# Patient Record
Sex: Male | Born: 1935 | Race: White | Hispanic: No | Marital: Married | Smoking: Current every day smoker
Health system: Southern US, Community
[De-identification: ages and names within clinical notes are randomized; demographics above are authoritative.]

## PROBLEM LIST (undated history)

## (undated) DIAGNOSIS — J189 Pneumonia, unspecified organism: Secondary | ICD-10-CM

## (undated) DIAGNOSIS — C61 Malignant neoplasm of prostate: Secondary | ICD-10-CM

## (undated) DIAGNOSIS — R0602 Shortness of breath: Secondary | ICD-10-CM

## (undated) DIAGNOSIS — I82622 Acute embolism and thrombosis of deep veins of left upper extremity: Secondary | ICD-10-CM

## (undated) DIAGNOSIS — L0390 Cellulitis, unspecified: Secondary | ICD-10-CM

## (undated) DIAGNOSIS — I480 Paroxysmal atrial fibrillation: Secondary | ICD-10-CM

## (undated) DIAGNOSIS — J449 Chronic obstructive pulmonary disease, unspecified: Secondary | ICD-10-CM

## (undated) DIAGNOSIS — F329 Major depressive disorder, single episode, unspecified: Secondary | ICD-10-CM

## (undated) DIAGNOSIS — Z9289 Personal history of other medical treatment: Secondary | ICD-10-CM

## (undated) DIAGNOSIS — M1990 Unspecified osteoarthritis, unspecified site: Secondary | ICD-10-CM

## (undated) DIAGNOSIS — I499 Cardiac arrhythmia, unspecified: Secondary | ICD-10-CM

## (undated) DIAGNOSIS — I5021 Acute systolic (congestive) heart failure: Secondary | ICD-10-CM

## (undated) DIAGNOSIS — Z950 Presence of cardiac pacemaker: Secondary | ICD-10-CM

## (undated) DIAGNOSIS — K219 Gastro-esophageal reflux disease without esophagitis: Secondary | ICD-10-CM

## (undated) DIAGNOSIS — I10 Essential (primary) hypertension: Secondary | ICD-10-CM

## (undated) HISTORY — DX: Unspecified osteoarthritis, unspecified site: M19.90

## (undated) HISTORY — DX: Chronic obstructive pulmonary disease, unspecified: J44.9

## (undated) HISTORY — DX: Presence of cardiac pacemaker: Z95.0

## (undated) HISTORY — DX: Shortness of breath: R06.02

## (undated) HISTORY — DX: Cellulitis, unspecified: L03.90

## (undated) HISTORY — DX: Personal history of other medical treatment: Z92.89

## (undated) HISTORY — DX: Paroxysmal atrial fibrillation: I48.0

## (undated) HISTORY — PX: PILONIDAL CYST DRAINAGE: SHX743

## (undated) HISTORY — PX: HERNIA REPAIR: SHX51

## (undated) HISTORY — DX: Major depressive disorder, single episode, unspecified: F32.9

## (undated) HISTORY — DX: Acute embolism and thrombosis of deep veins of left upper extremity: I82.622

## (undated) HISTORY — DX: Malignant neoplasm of prostate: C61

---

## 2003-04-25 ENCOUNTER — Ambulatory Visit (HOSPITAL_COMMUNITY): Admission: RE | Admit: 2003-04-25 | Discharge: 2003-04-25 | Payer: Self-pay | Admitting: Family Medicine

## 2005-12-12 ENCOUNTER — Encounter: Admission: RE | Admit: 2005-12-12 | Discharge: 2005-12-12 | Payer: Self-pay | Admitting: Family Medicine

## 2009-05-25 ENCOUNTER — Ambulatory Visit: Admission: RE | Admit: 2009-05-25 | Discharge: 2009-08-23 | Payer: Self-pay | Admitting: Radiation Oncology

## 2009-08-24 ENCOUNTER — Ambulatory Visit: Admission: RE | Admit: 2009-08-24 | Discharge: 2009-10-03 | Payer: Self-pay | Admitting: Radiation Oncology

## 2010-07-27 ENCOUNTER — Ambulatory Visit (INDEPENDENT_AMBULATORY_CARE_PROVIDER_SITE_OTHER): Payer: Medicare Other | Admitting: Urology

## 2010-07-27 DIAGNOSIS — C61 Malignant neoplasm of prostate: Secondary | ICD-10-CM

## 2010-07-27 DIAGNOSIS — N529 Male erectile dysfunction, unspecified: Secondary | ICD-10-CM

## 2010-10-05 DIAGNOSIS — Z9289 Personal history of other medical treatment: Secondary | ICD-10-CM | POA: Insufficient documentation

## 2010-10-05 HISTORY — PX: US ECHOCARDIOGRAPHY: HXRAD669

## 2010-10-05 HISTORY — PX: NM MYOCAR PERF WALL MOTION: HXRAD629

## 2010-10-05 HISTORY — PX: TRANSTHORACIC ECHOCARDIOGRAM: SHX275

## 2010-10-05 HISTORY — DX: Personal history of other medical treatment: Z92.89

## 2010-11-30 ENCOUNTER — Ambulatory Visit (INDEPENDENT_AMBULATORY_CARE_PROVIDER_SITE_OTHER): Payer: Medicare Other | Admitting: Urology

## 2010-11-30 DIAGNOSIS — C61 Malignant neoplasm of prostate: Secondary | ICD-10-CM

## 2010-11-30 DIAGNOSIS — N529 Male erectile dysfunction, unspecified: Secondary | ICD-10-CM

## 2011-05-31 ENCOUNTER — Ambulatory Visit (INDEPENDENT_AMBULATORY_CARE_PROVIDER_SITE_OTHER): Payer: Medicare Other | Admitting: Urology

## 2011-05-31 DIAGNOSIS — C61 Malignant neoplasm of prostate: Secondary | ICD-10-CM

## 2011-07-18 ENCOUNTER — Ambulatory Visit (HOSPITAL_COMMUNITY)
Admission: RE | Admit: 2011-07-18 | Discharge: 2011-07-18 | Disposition: A | Discharge status: Home / Self Care | Payer: Medicare Other | Source: Ambulatory Visit | Attending: Internal Medicine | Admitting: Internal Medicine

## 2011-07-18 ENCOUNTER — Other Ambulatory Visit (HOSPITAL_COMMUNITY): Payer: Self-pay | Admitting: Internal Medicine

## 2011-07-18 DIAGNOSIS — R55 Syncope and collapse: Secondary | ICD-10-CM

## 2011-07-18 DIAGNOSIS — W19XXXA Unspecified fall, initial encounter: Secondary | ICD-10-CM

## 2011-07-18 DIAGNOSIS — S0990XA Unspecified injury of head, initial encounter: Secondary | ICD-10-CM | POA: Insufficient documentation

## 2011-07-18 DIAGNOSIS — W1809XA Striking against other object with subsequent fall, initial encounter: Secondary | ICD-10-CM | POA: Insufficient documentation

## 2011-07-18 DIAGNOSIS — R51 Headache: Secondary | ICD-10-CM | POA: Insufficient documentation

## 2011-07-19 ENCOUNTER — Other Ambulatory Visit (HOSPITAL_COMMUNITY): Payer: Self-pay | Admitting: Cardiovascular Disease

## 2011-07-19 ENCOUNTER — Encounter (HOSPITAL_COMMUNITY): Payer: Self-pay | Admitting: Pharmacy Technician

## 2011-07-19 ENCOUNTER — Ambulatory Visit (HOSPITAL_COMMUNITY)
Admission: RE | Admit: 2011-07-19 | Discharge: 2011-07-19 | Disposition: A | Discharge status: Home / Self Care | Payer: Medicare Other | Source: Ambulatory Visit | Attending: Cardiovascular Disease | Admitting: Cardiovascular Disease

## 2011-07-19 DIAGNOSIS — Z01811 Encounter for preprocedural respiratory examination: Secondary | ICD-10-CM

## 2011-07-19 DIAGNOSIS — Z01818 Encounter for other preprocedural examination: Secondary | ICD-10-CM | POA: Insufficient documentation

## 2011-07-20 ENCOUNTER — Other Ambulatory Visit: Payer: Self-pay | Admitting: Cardiovascular Disease

## 2011-07-21 MED ORDER — SODIUM CHLORIDE 0.9 % IJ SOLN: 3.0000 mL | Freq: Two times a day (BID) | INTRAMUSCULAR | Status: DC

## 2011-07-21 MED ORDER — SODIUM CHLORIDE 0.9 % IR SOLN
80.0000 mg | Status: DC
  Filled 2011-07-21: qty 2

## 2011-07-21 MED ORDER — CEFAZOLIN SODIUM-DEXTROSE 2-3 GM-% IV SOLR
2.0000 g | INTRAVENOUS | Status: DC
  Filled 2011-07-21: qty 50

## 2011-07-21 MED ORDER — SODIUM CHLORIDE 0.9 % IV SOLN
250.0000 mL | INTRAVENOUS | Status: DC
  Administered 2011-07-22: 1000 mL via INTRAVENOUS

## 2011-07-21 MED ORDER — SODIUM CHLORIDE 0.9 % IJ SOLN: 3.0000 mL | INTRAMUSCULAR | Status: DC | PRN

## 2011-07-21 MED ORDER — SODIUM CHLORIDE 0.45 % IV SOLN
INTRAVENOUS | Status: DC
  Administered 2011-07-22: 09:00:00 via INTRAVENOUS

## 2011-07-22 ENCOUNTER — Encounter (HOSPITAL_COMMUNITY)
Admission: RE | Disposition: A | Discharge status: Home / Self Care | Payer: Self-pay | Source: Ambulatory Visit | Attending: Cardiovascular Disease

## 2011-07-22 ENCOUNTER — Ambulatory Visit (HOSPITAL_COMMUNITY): Payer: Medicare Other

## 2011-07-22 ENCOUNTER — Ambulatory Visit (HOSPITAL_COMMUNITY)
Admission: RE | Admit: 2011-07-22 | Discharge: 2011-07-23 | Disposition: A | Discharge status: Home / Self Care | Payer: Medicare Other | Source: Ambulatory Visit | Attending: Cardiovascular Disease | Admitting: Cardiovascular Disease

## 2011-07-22 DIAGNOSIS — R55 Syncope and collapse: Secondary | ICD-10-CM | POA: Insufficient documentation

## 2011-07-22 DIAGNOSIS — I4891 Unspecified atrial fibrillation: Secondary | ICD-10-CM | POA: Insufficient documentation

## 2011-07-22 DIAGNOSIS — Z950 Presence of cardiac pacemaker: Secondary | ICD-10-CM

## 2011-07-22 DIAGNOSIS — I495 Sick sinus syndrome: Secondary | ICD-10-CM | POA: Insufficient documentation

## 2011-07-22 HISTORY — PX: PACEMAKER INSERTION: SHX728

## 2011-07-22 HISTORY — DX: Cardiac arrhythmia, unspecified: I49.9

## 2011-07-22 HISTORY — DX: Unspecified osteoarthritis, unspecified site: M19.90

## 2011-07-22 HISTORY — DX: Presence of cardiac pacemaker: Z95.0

## 2011-07-22 HISTORY — DX: Gastro-esophageal reflux disease without esophagitis: K21.9

## 2011-07-22 HISTORY — PX: PERMANENT PACEMAKER INSERTION: SHX5480

## 2011-07-22 HISTORY — DX: Essential (primary) hypertension: I10

## 2011-07-22 LAB — SURGICAL PCR SCREEN
MRSA, PCR: NEGATIVE
Staphylococcus aureus: NEGATIVE

## 2011-07-22 SURGERY — PERMANENT PACEMAKER INSERTION
Anesthesia: LOCAL

## 2011-07-22 MED ORDER — MUPIROCIN 2 % EX OINT
TOPICAL_OINTMENT | CUTANEOUS | Status: AC
  Administered 2011-07-22: 1
  Filled 2011-07-22: qty 22

## 2011-07-22 MED ORDER — CEFAZOLIN SODIUM 1-5 GM-% IV SOLN
1.0000 g | Freq: Four times a day (QID) | INTRAVENOUS | Status: AC
  Administered 2011-07-22 – 2011-07-23 (×3): 1 g via INTRAVENOUS
  Filled 2011-07-22 (×3): qty 50

## 2011-07-22 MED ORDER — WARFARIN SODIUM 7.5 MG PO TABS: 15.0000 mg | ORAL_TABLET | ORAL | Status: DC

## 2011-07-22 MED ORDER — WARFARIN SODIUM 2.5 MG PO TABS
2.5000 mg | ORAL_TABLET | Freq: Every day | ORAL | Status: DC
Start: 2011-07-22 — End: 2011-07-22

## 2011-07-22 MED ORDER — MIDAZOLAM HCL 2 MG/2ML IJ SOLN
INTRAMUSCULAR | Status: AC
  Filled 2011-07-22: qty 2

## 2011-07-22 MED ORDER — WARFARIN SODIUM 10 MG PO TABS: 10.0000 mg | ORAL_TABLET | ORAL | Status: DC

## 2011-07-22 MED ORDER — ASPIRIN EC 81 MG PO TBEC
81.0000 mg | DELAYED_RELEASE_TABLET | Freq: Every day | ORAL | Status: DC
  Administered 2011-07-23: 81 mg via ORAL
  Filled 2011-07-22: qty 1

## 2011-07-22 MED ORDER — WARFARIN SODIUM 2.5 MG PO TABS
12.5000 mg | ORAL_TABLET | ORAL | Status: AC
  Administered 2011-07-22: 12.5 mg via ORAL
  Filled 2011-07-22: qty 1

## 2011-07-22 MED ORDER — FENTANYL CITRATE 0.05 MG/ML IJ SOLN
INTRAMUSCULAR | Status: AC
  Filled 2011-07-22: qty 2

## 2011-07-22 MED ORDER — ONDANSETRON HCL 4 MG/2ML IJ SOLN: 4.0000 mg | Freq: Four times a day (QID) | INTRAMUSCULAR | Status: DC | PRN

## 2011-07-22 MED ORDER — CEFAZOLIN SODIUM-DEXTROSE 2-3 GM-% IV SOLR
INTRAVENOUS | Status: AC
  Filled 2011-07-22: qty 50

## 2011-07-22 MED ORDER — ZOLPIDEM TARTRATE 5 MG PO TABS
5.0000 mg | ORAL_TABLET | Freq: Every evening | ORAL | Status: DC | PRN
  Administered 2011-07-23: 5 mg via ORAL
  Filled 2011-07-22: qty 1

## 2011-07-22 MED ORDER — METOPROLOL SUCCINATE 12.5 MG HALF TABLET
12.5000 mg | ORAL_TABLET | Freq: Every day | ORAL | Status: DC
  Administered 2011-07-22 – 2011-07-23 (×2): 12.5 mg via ORAL
  Filled 2011-07-22 (×2): qty 1

## 2011-07-22 MED ORDER — ACETAMINOPHEN 325 MG PO TABS
325.0000 mg | ORAL_TABLET | ORAL | Status: DC | PRN
  Administered 2011-07-22: 650 mg via ORAL
  Filled 2011-07-22: qty 2

## 2011-07-22 MED ORDER — LIDOCAINE HCL (PF) 1 % IJ SOLN
INTRAMUSCULAR | Status: AC
  Filled 2011-07-22: qty 90

## 2011-07-22 MED ORDER — WARFARIN - PHYSICIAN DOSING INPATIENT: Freq: Every day | Status: DC

## 2011-07-22 MED ORDER — PANTOPRAZOLE SODIUM 40 MG PO TBEC: 40.0000 mg | DELAYED_RELEASE_TABLET | Freq: Every day | ORAL | Status: DC

## 2011-07-22 MED ORDER — LISINOPRIL 5 MG PO TABS
5.0000 mg | ORAL_TABLET | Freq: Every day | ORAL | Status: DC
  Administered 2011-07-22 – 2011-07-23 (×2): 5 mg via ORAL
  Filled 2011-07-22 (×2): qty 1

## 2011-07-22 NOTE — H&P (Signed)
Tracie, Lindbloom Male, 78 y.o., [birth year]   MRN: [id number]  CSN: [id number]   Date of Initial H&P: 07/18/11  History reviewed, patient examined, no change in status, stable for surgery. &78 year old with syncope and event monitor documentation of sinus node dysfunction, and tachycardia-bradycardia syndrome (PAFib).  Dual chamber pacemaker implantation has been fully reviewed with the patient and written informed consent has been obtained. Thurmon Fair, MD, [facility] [phone] office [phone] pager 07/22/2011 8:54 AM

## 2011-07-22 NOTE — CV Procedure (Signed)
Hallis, Meditz Male, 78 y.o., May 08, 1930  MRN: [id number]  CSN: [id number]  Procedure report  Procedure performed: 1. Implantation of new dual chamber permanent pacemaker 2. Fluoroscopy 3. Light sedation    Reason for procedure: Symptomatic bradycardia and syncope due to: Sinus node dysfunction Tachycardia-bradycardia syndrome Bradycardia due to necessary medications Paroxysmal atrial fibrillation  Procedure performed by: Thurmon Fair, MD  Complications: None  Estimated blood loss: <10 mL  Medications administered during procedure: Ancef 1 g intravenously Lidocaine 1% 30 mL locally,  Fentanyl 100 mcg intravenously Versed 4 mg intravenously  Device details: Orthoptist DR RF model [id number] serial number [id number] Right atrial lead [employer] Tendril STS [phone] serial number [id number] Right ventricular lead [employer] Tendril STS [phone] serial number [id number]  Procedure details:  After the risks and benefits of the procedure were discussed the patient provided informed consent and was brought to the cardiac cath lab in the fasting state. The patient was prepped and draped in usual sterile fashion. Local anesthesia with 1% lidocaine was administered to to the left infraclavicular area. A 5-6 cm horizontal incision was made parallel with and 2-3 cm caudal to the left clavicle. Using electrocautery and blunt dissection a prepectoral pocket was created down to the level of the pectoralis major muscle fascia. The pocket was carefully inspected for hemostasis. An antibiotic-soaked sponge was placed in the pocket.  Under fluoroscopic guidance and using the modified Seldinger technique 2 separate venipunctures were performed to access the left subclavian vein. No difficulty was encountered accessing the vein.  Two J-tip guidewires were subsequently exchanged for two 7 French safe sheaths.  Under fluoroscopic guidance the ventricular lead was  advanced to level of the mid to apical right ventricular septum and thet active-fixation helix was deployed. Prominent current of injury was seen. Satisfactory pacing and sensing parameters were recorded. There was no evidence of diaphragmatic stimulation at maximum device output. The safe sheath was peeled away and the lead was secured in place with 2-0 silk.  In similar fashion the right atrial lead was advanced to the level of the atrial appendage. The active-fixation helix was deployed. There was prominent current of injury. Satisfactory  pacing and sensing parameters were recorded. There was no evidence of diaphragmatic stimulation with pacing at maximum device output. The safe sheath was peeled away and the lead was secured in place with 2-0 silk.  The antibiotic-soaked sponge was removed from the pocket. The pocket was flushed with copious amounts of antibiotic solution. Reinspection showed excellent hemostasis..  The ventricular lead was connected to the generator and appropriate ventricular pacing was seen. Subsequently the atrial lead was also connected. Repeat testing of the lead parameters later showed excellent values.  The entire system was then carefully inserted in the pocket with care been taking that the leads and device assumed a comfortable position without pressure on the incision. Great care was taken that the leads be located deep to the generator. The pocket was then closed in layers using 2 layers of 2-0 Vicryl and cutaneous staples, after which a sterile dressing was applied.  At the end of the procedure the following lead parameters were encountered:  Right atrial lead  sensed P waves 2.7, impedance 423 ohms, threshold 1.7 V at 0.4 ms pulse width. Current 4 mA  Right ventricular lead sensed R waves 14.6 mV, impedance 529 ohms, threshold 1.0 V at 0.4 ms pulse width. Current 2.5 mA   Thurmon Fair, MD, [facility] [phone]  office [phone] pager

## 2011-07-23 ENCOUNTER — Encounter (HOSPITAL_COMMUNITY): Payer: Self-pay | Admitting: General Practice

## 2011-07-23 ENCOUNTER — Ambulatory Visit (HOSPITAL_COMMUNITY): Payer: Medicare Other

## 2011-07-23 NOTE — Progress Notes (Signed)
Orthopedic Tech Progress Note Patient Details:  Cody George 01-02-1931 [id number]  Ortho Devices Type of Ortho Device: Arm foam sling Ortho Device/Splint Interventions: Application   Cammer, Mickie Bail 07/23/2011, 1:49 AM

## 2011-07-23 NOTE — Discharge Instructions (Signed)

## 2011-07-23 NOTE — Progress Notes (Signed)
[facility]  Subjective: Feels good.  No SOB  Objective: Vital signs in last 24 hours: Temp:  [97.1 F (36.2 C)-97.7 F (36.5 C)] 97.1 F (36.2 C) (06/08 0600) Pulse Rate:  [64-107] 64  (06/08 0600) Resp:  [14-16] 15  (06/08 0600) BP: (96-144)/(63-97) 126/77 mmHg (06/08 0600) SpO2:  [93 %-97 %] 95 % (06/08 0600) Last BM Date: 07/21/11  Intake/Output from previous day: 06/07 0701 - 06/08 0700 In: 360 [P.O.:360] Out: 2150 [Urine:2150] Intake/Output this shift:    Medications Current Facility-Administered Medications  Medication Dose Route Frequency Provider Last Rate Last Dose  . acetaminophen (TYLENOL) tablet 325-650 mg  325-650 mg Oral Q4H PRN Thurmon Fair, MD   650 mg at 07/22/11 1252  . aspirin EC tablet 81 mg  81 mg Oral Daily Mihai Croitoru, MD   81 mg at 07/23/11 1033  . ceFAZolin (ANCEF) IVPB 1 g/50 mL premix  1 g Intravenous Q6H Mihai Croitoru, MD   1 g at 07/23/11 0626  . lisinopril (PRINIVIL,ZESTRIL) tablet 5 mg  5 mg Oral Daily Mihai Croitoru, MD   5 mg at 07/23/11 1033  . metoprolol succinate (TOPROL-XL) 24 hr tablet 12.5 mg  12.5 mg Oral Daily Mihai Croitoru, MD   12.5 mg at 07/23/11 1033  . ondansetron (ZOFRAN) injection 4 mg  4 mg Intravenous Q6H PRN Mihai Croitoru, MD      . pantoprazole (PROTONIX) EC tablet 40 mg  40 mg Oral Q1200 Mihai Croitoru, MD      . warfarin (COUMADIN) tablet 12.5 mg  12.5 mg Oral Custom Mihai Croitoru, MD   12.5 mg at 07/22/11 1717  . warfarin (COUMADIN) tablet 15 mg  15 mg Oral Custom Thurmon Fair, MD      . Warfarin - Physician Dosing Inpatient   Does not apply q1800 Mihai Croitoru, MD      . zolpidem (AMBIEN) tablet 5 mg  5 mg Oral QHS PRN Thurmon Fair, MD   5 mg at 07/23/11 0036  . DISCONTD: 0.45 % sodium chloride infusion   Intravenous Continuous Thurmon Fair, MD 75 mL/hr at 07/22/11 0923    . DISCONTD: 0.9 %  sodium chloride infusion  250 mL Intravenous Continuous Mihai Croitoru, MD 85 mL/hr  at 07/22/11 0923 1,000 mL at 07/22/11 0923  . DISCONTD: ceFAZolin (ANCEF) IVPB 2 g/50 mL premix  2 g Intravenous On Call Thurmon Fair, MD      . DISCONTD: gentamicin (GARAMYCIN) 80 mg in sodium chloride irrigation 0.9 % 500 mL irrigation  80 mg Irrigation On Call Thurmon Fair, MD      . DISCONTD: sodium chloride 0.9 % injection 3 mL  3 mL Intravenous Q12H Mihai Croitoru, MD      . DISCONTD: sodium chloride 0.9 % injection 3 mL  3 mL Intravenous PRN Thurmon Fair, MD      . DISCONTD: warfarin (COUMADIN) tablet 10 mg  10 mg Oral See admin instructions Thurmon Fair, MD      . DISCONTD: warfarin (COUMADIN) tablet 2.5-5 mg  2.5-5 mg Oral Daily Mihai Croitoru, MD        PE: General appearance: alert, cooperative and no distress Lungs: clear to auscultation bilaterally Heart: regular rate and rhythm, S1, S2 normal, no murmur, click, rub or gallop Extremities: No LEE Pulses: 2+ and symmetric Pacer site: Bandaged,  Hemostasis.   Lab Results:  No results found for this basename: WBC:3,HGB:3,HCT:3,PLT:3 in the last 72 hours BMET No results found for this basename:  NA:3,K:3,CL:3,CO2:3,GLUCOSE:3,BUN:3,CREATININE:3,CALCIUM:3 in the last 72 hours PT/INR  Basename 07/23/11 0910 07/23/11 0738 07/22/11 0954  LABPROT 29.6* 28.1* 26.0*  INR 2.76* 2.58* 2.34*   Cholesterol No results found for this basename: CHOL in the last 72 hours Cardiac Enzymes No components found with this basename: TROPONIN:3, CKMB:3  Studies/Results: CHEST - 2 VIEW  Comparison: 07/19/2011 and 12/12/2005.  Findings: Interval left subclavian pacemaker placement with leads  in the right atrium and right ventricle. The heart size and  mediastinal contours are stable with aortic tortuosity. There is  no pneumothorax or significant pleural effusion. There is a new  left infrahilar density on the frontal examination without definite  corresponding finding on the lateral view. The right lung is  clear.  IMPRESSION:  1.  Pacemaker placement without complicating pneumothorax.  2. Left infrahilar density is new from 07/19/2011 and likely  atelectasis or a small infiltrate. Correlate clinically and  consider radiographic followup.  CT HEAD WITHOUT CONTRAST  Technique: Contiguous axial images were obtained from the base of  the skull through the vertex without contrast.  Comparison: Head CT 07/18/2011  Findings: Stable punctate basal ganglia calcifications. Normal and  stable ventricular size.  Negative for hemorrhage, hydrocephalus, mass effect, mass lesion,  or evidence of acute cortically based infarction. Mild  periventricular chronic microvascular ischemic changes in the  parietal lobes bilaterally are stable.  Mucosal thickening of some of the left ethmoid air cells, stable.  The skull is intact. Scalp soft tissues are symmetric.  IMPRESSION:  No acute intracranial abnormality.   Assessment/Plan  Active Problems:  * No active hospital problems. *  Syncope Sinus node dysfunction Tachy-Brady Syndrome(Paf)  Plan:  S/P PPM.  No pneumothorax.  BP stable today.   A pacing on tele.  DC home today with wound site check in 7-10 days.   LOS: 1 day    HAGER, BRYAN 07/23/2011 11:55 AM    Patient seen and examined. Agree with assessment and plan.  Feels well. Pacemaker site without hematoma or ecchymosis. CXR without pneumo.  Plan DC today.   Lennette Bihari, MD, [facility] 07/23/2011 12:08 PM

## 2011-07-25 NOTE — Discharge Summary (Signed)
Physician Discharge Summary  Patient ID: Cody George MRN: [id number] DOB/AGE: May 21, 1930 78 y.o.  Admit date: 07/22/2011 Discharge date: 07/25/2011  Admission Diagnoses:  Discharge Diagnoses:  Active Problems:  Syncope   Discharged Condition: stable  Hospital Course:   The patient is a 78 year old gentleman who went to see Dr. Margo Aye today after experiencing a syncopal episode.  He has been having "spells with his head" for the last couple of years, but these have recently increased in frequency. He described them as a sensation of dizziness without the room actually spinning. He has never lost consciousness before. He very briefly felt this type of prodromal complaint, after which he found himself on the floor. He injured both his elbows. His wife found him on the ground with his eyes open, but not really focusing. After about 10 seconds, he came to and was immediately oriented, although he did not recall losing consciousness. He denied any palpitations before or after this event. He did not have any chest pain or shortness of breath, and in fact he is an active gentleman who still exercises at the gym. He has never experienced full-blown syncope before. He has never had convulsions and has no history of a stroke.  He did wear an event monitor in the past proving that he has both paroxysmal atrial fibrillation with heart rate as fast as 160 beats per minute and also severe sinus bradycardia with rates down to the 45 beats/minute range. Additional comorbidities include prostate cancer, treated with radiation therapy, systemic hypertension, gastroesophageal reflux disease, osteoarthritis. He has been on warfarin anticoagulation for his atrial fibrillation and has not had any serious bleeding complications. Unfortunately, he has only been able to tolerate a tiny dose of beta blocker (Toprol-XL 12.5 mg daily) because of his bradycardia.  He presented for implantation of a dual chamber  PPM.  This was completed without complications.  No pneumothorax was seen on CXR.  He was discharged in stable condition and will follow up for a wound check in 7-10 days.   Consults: None  Significant Diagnostic Studies: Cody George, Cody George, 78 y.o., 06-Oct-1930  MRN: [id number]  CSN: [id number]  Procedure report  Procedure performed:  1. Implantation of new dual chamber permanent pacemaker  2. Fluoroscopy  3. Light sedation  Reason for procedure:  Symptomatic bradycardia and syncope due to:  Sinus node dysfunction  Tachycardia-bradycardia syndrome  Bradycardia due to necessary medications  Paroxysmal atrial fibrillation  Procedure performed by:  Thurmon Fair, MD  Complications:  None  Estimated blood loss:  <10 mL  Medications administered during procedure:  Ancef 1 g intravenously  Lidocaine 1% 30 mL locally,  Fentanyl 100 mcg intravenously  Versed 4 mg intravenously  Device details:  Orthoptist DR RF model [id number] serial number [id number]  Right atrial lead [employer] Tendril STS [phone] serial number [id number]  Right ventricular lead [employer] Tendril STS [phone] serial number [id number]  Procedure details:  After the risks and benefits of the procedure were discussed the patient provided informed consent and was brought to the cardiac cath lab in the fasting state. The patient was prepped and draped in usual sterile fashion. Local anesthesia with 1% lidocaine was administered to to the left infraclavicular area. A 5-6 cm horizontal incision was made parallel with and 2-3 cm caudal to the left clavicle. Using electrocautery and blunt dissection a prepectoral pocket was created down to the level of the pectoralis major muscle fascia. The pocket was carefully  inspected for hemostasis. An antibiotic-soaked sponge was placed in the pocket.  Under fluoroscopic guidance and using the modified Seldinger technique 2 separate venipunctures were  performed to access the left subclavian vein. No difficulty was encountered accessing the vein. Two J-tip guidewires were subsequently exchanged for two 7 French safe sheaths.  Under fluoroscopic guidance the ventricular lead was advanced to level of the mid to apical right ventricular septum and thet active-fixation helix was deployed. Prominent current of injury was seen. Satisfactory pacing and sensing parameters were recorded. There was no evidence of diaphragmatic stimulation at maximum device output. The safe sheath was peeled away and the lead was secured in place with 2-0 silk.  In similar fashion the right atrial lead was advanced to the level of the atrial appendage. The active-fixation helix was deployed. There was prominent current of injury. Satisfactory pacing and sensing parameters were recorded. There was no evidence of diaphragmatic stimulation with pacing at maximum device output. The safe sheath was peeled away and the lead was secured in place with 2-0 silk.  The antibiotic-soaked sponge was removed from the pocket. The pocket was flushed with copious amounts of antibiotic solution. Reinspection showed excellent hemostasis..  The ventricular lead was connected to the generator and appropriate ventricular pacing was seen. Subsequently the atrial lead was also connected. Repeat testing of the lead parameters later showed excellent values.  The entire system was then carefully inserted in the pocket with care been taking that the leads and device assumed a comfortable position without pressure on the incision. Great care was taken that the leads be located deep to the generator. The pocket was then closed in layers using 2 layers of 2-0 Vicryl and cutaneous staples, after which a sterile dressing was applied.  At the end of the procedure the following lead parameters were encountered:  Right atrial lead  sensed P waves 2.7, impedance 423 ohms, threshold 1.7 V at 0.4 ms pulse width. Current 4  mA  Right ventricular lead sensed R waves 14.6 mV, impedance 529 ohms, threshold 1.0 V at 0.4 ms pulse width. Current 2.5 mA  Thurmon Fair, MD, [facility]  [phone] office  [phone] pager      CHEST - 2 VIEW  Comparison: 07/19/2011 and 12/12/2005.  Findings: Interval left subclavian pacemaker placement with leads  in the right atrium and right ventricle. The heart size and  mediastinal contours are stable with aortic tortuosity. There is  no pneumothorax or significant pleural effusion. There is a new  left infrahilar density on the frontal examination without definite  corresponding finding on the lateral view. The right lung is  clear.  IMPRESSION:  1. Pacemaker placement without complicating pneumothorax.  2. Left infrahilar density is new from 07/19/2011 and likely  atelectasis or a small infiltrate. Correlate clinically and  consider radiographic followup.  CT HEAD WITHOUT CONTRAST  Technique: Contiguous axial images were obtained from the base of  the skull through the vertex without contrast.  Comparison: Head CT 07/18/2011  Findings: Stable punctate basal ganglia calcifications. Normal and  stable ventricular size.  Negative for hemorrhage, hydrocephalus, mass effect, mass lesion,  or evidence of acute cortically based infarction. Mild  periventricular chronic microvascular ischemic changes in the  parietal lobes bilaterally are stable.  Mucosal thickening of some of the left ethmoid air cells, stable.  The skull is intact. Scalp soft tissues are symmetric.  IMPRESSION:  No acute intracranial abnormality.   Treatments: Dual Chamber PPM  Discharge Exam: Blood pressure 126/77, pulse 64, temperature 97.1 F (36.2 C), temperature source Oral, resp. rate 15, height 5\' 9"  (1.753 m), weight 85.276 kg (188 lb), SpO2 95.00%.   Disposition: 01-Home or Self Care  Discharge Orders    Future Orders Please Complete By Expires     Diet - low sodium heart healthy      Increase activity slowly      Discharge instructions      Comments:   No lifting with your left arm.  No  Driving for three days.     Medication List  As of 07/25/2011  2:11 PM   TAKE these medications         aspirin EC 81 MG tablet   Take 81 mg by mouth daily.      Flax Seeds Powd   Take 2 scoop by mouth daily.      lisinopril 10 MG tablet   Commonly known as: PRINIVIL,ZESTRIL   Take 5 mg by mouth daily.      metoprolol succinate 25 MG 24 hr tablet   Commonly known as: TOPROL-XL   Take 12.5 mg by mouth daily.      omeprazole 20 MG capsule   Commonly known as: PRILOSEC   Take 20 mg by mouth daily.      OVER THE COUNTER MEDICATION   Take 4-5 oz by mouth daily. Cherry Juice      VITAMIN C (CALCIUM ASCORBATE) PO   Take 1 tablet by mouth daily.      VITAMIN D (CHOLECALCIFEROL) PO   Take 1 tablet by mouth daily.      warfarin 10 MG tablet   Commonly known as: COUMADIN   Take 10 mg by mouth See admin instructions. Patient takes 1-10 mg tablet every day along with 5mg  or 2.5mg  to equal total daily dose (15mg  or 12.5mg ).  Patient takes 12.5 mg Sun, Tue, Wed, Fri, Sat.  15mg  on Mon and Thurs.           Follow-up Information    Follow up with Thurmon Fair, MD. (Our office will call you with the appt date and time.)    Contact information:   [address] [place] [address] [phone]          Signed: Wilburt Finlay 07/25/2011, 2:11 PM

## 2011-10-27 ENCOUNTER — Other Ambulatory Visit (HOSPITAL_COMMUNITY): Payer: Self-pay | Admitting: Internal Medicine

## 2011-10-27 ENCOUNTER — Ambulatory Visit (HOSPITAL_COMMUNITY)
Admission: RE | Admit: 2011-10-27 | Discharge: 2011-10-27 | Disposition: A | Discharge status: Home / Self Care | Payer: Medicare Other | Source: Ambulatory Visit | Attending: Internal Medicine | Admitting: Internal Medicine

## 2011-10-27 DIAGNOSIS — R0600 Dyspnea, unspecified: Secondary | ICD-10-CM

## 2011-10-27 DIAGNOSIS — R05 Cough: Secondary | ICD-10-CM

## 2011-10-27 DIAGNOSIS — F17200 Nicotine dependence, unspecified, uncomplicated: Secondary | ICD-10-CM | POA: Insufficient documentation

## 2011-10-27 DIAGNOSIS — R059 Cough, unspecified: Secondary | ICD-10-CM | POA: Insufficient documentation

## 2011-10-27 DIAGNOSIS — R0989 Other specified symptoms and signs involving the circulatory and respiratory systems: Secondary | ICD-10-CM | POA: Insufficient documentation

## 2011-10-27 DIAGNOSIS — R0609 Other forms of dyspnea: Secondary | ICD-10-CM | POA: Insufficient documentation

## 2011-10-27 DIAGNOSIS — R5383 Other fatigue: Secondary | ICD-10-CM

## 2011-12-01 ENCOUNTER — Encounter (INDEPENDENT_AMBULATORY_CARE_PROVIDER_SITE_OTHER): Payer: Self-pay | Admitting: *Deleted

## 2011-12-07 ENCOUNTER — Ambulatory Visit (INDEPENDENT_AMBULATORY_CARE_PROVIDER_SITE_OTHER): Payer: Medicare Other | Admitting: Internal Medicine

## 2011-12-19 ENCOUNTER — Encounter: Payer: Self-pay | Admitting: Infectious Diseases

## 2011-12-19 ENCOUNTER — Ambulatory Visit (INDEPENDENT_AMBULATORY_CARE_PROVIDER_SITE_OTHER): Payer: Medicare Other | Admitting: Infectious Diseases

## 2011-12-19 VITALS — BP 134/74 | HR 79 | Temp 98.0°F | Ht 68.0 in | Wt 191.0 lb

## 2011-12-19 DIAGNOSIS — M7040 Prepatellar bursitis, unspecified knee: Secondary | ICD-10-CM | POA: Insufficient documentation

## 2011-12-19 DIAGNOSIS — Z950 Presence of cardiac pacemaker: Secondary | ICD-10-CM

## 2011-12-19 DIAGNOSIS — C61 Malignant neoplasm of prostate: Secondary | ICD-10-CM

## 2011-12-19 MED ORDER — DOXYCYCLINE HYCLATE 100 MG PO TABS: 100.0000 mg | ORAL_TABLET | Freq: Two times a day (BID) | ORAL | Status: DC

## 2011-12-19 MED ORDER — MUPIROCIN CALCIUM 2 % NA OINT: TOPICAL_OINTMENT | Freq: Two times a day (BID) | NASAL | Status: DC

## 2011-12-19 NOTE — Addendum Note (Signed)
Addended by: Daritza Brees C on: 12/19/2011 03:00 PM   Modules accepted: Orders

## 2011-12-19 NOTE — Addendum Note (Signed)
Addended by: Lurlean Leyden on: 12/19/2011 04:52 PM   Modules accepted: Orders

## 2011-12-19 NOTE — Assessment & Plan Note (Addendum)
He has native knees. I do not believe that they are infected at this point. At minimum he has had "prepatellar bursitis" (although could also have had gout) although it is unusual to have had migratory bursitis due to infection only involving knees.  I have asked him to return ASAP when he has recurrence. He may need joint aspiration.  I spoke to he and his wife about staph infections. Will decolonize him in case this is what caused these issues. Have asked him to wash his clothes in hot water, not share towels or linens. Use an antibacterial soap (dial, safegaurd, lever 2000). Use caution in the "gym". Will change his clinda to doxy (clinda is associated with C diff and staph also has inducible resistance). Will give him mupirocin to attempt to declonize him. Ask him to let his PMD know that he is on anbx that may interfere with his coumadin.  See him back prn

## 2011-12-19 NOTE — Progress Notes (Signed)
  Subjective:    Patient ID: Cody George, male    DOB: 12-Jul-1930, 78 y.o.   MRN: [id number]  HPI 78 yo M with hx of L knee infection in April 22, 2011. He was seen by ortho at that time and was given anbx at that time (keflex, plain films -, prepatellar bursitis). He has since had infection in his R knee (11-07-11) and again received anbx (keflex, prepatellar bursitis, plain films -). His knee infection resolved. He again developed pain (11-30-11) in infection in his L knee and was put on anbx (ceftriaxone injectionkeflex, prepatellar bursitis). He developed diarrhea and his anbx were switched. He had a blister on his L knee at that time, he popped it with a needle and clear fluid drained out of it.  He also begun to have problems with his R knee concurrently. 7 days ago was changed to clindamycin.  Had pacer July 25, 2011.  Previously treated for prostate Ca with XRT.  Never had joint aspiration.  Denies fever or chills.    Review of Systems  Constitutional: Negative for appetite change.  Gastrointestinal: Negative for diarrhea and constipation.  Genitourinary: Negative for dysuria.  Musculoskeletal: Positive for joint swelling and arthralgias.       Objective:   Physical Exam  Constitutional: He appears well-developed and well-nourished.  HENT:  Mouth/Throat: No oropharyngeal exudate.  Eyes: EOM are normal. Pupils are equal, round, and reactive to light.  Neck: Neck supple.  Cardiovascular: Normal rate, regular rhythm and normal heart sounds.   Pulmonary/Chest: Effort normal and breath sounds normal.    Abdominal: Soft. Bowel sounds are normal. There is no tenderness. There is no rebound.  Musculoskeletal:       Legs: Lymphadenopathy:    He has no cervical adenopathy.          Assessment & Plan:

## 2011-12-23 ENCOUNTER — Telehealth: Payer: Self-pay | Admitting: *Deleted

## 2011-12-23 NOTE — Telephone Encounter (Signed)
Pt's daughter advised that the results are incomplete at this time.  Final results will be available by Monday, Nov. 11, 2013.  Pt's daughter requested a call from Dr. Ninetta Lights to discuss the final culture results on Monday.

## 2011-12-26 LAB — CULTURE, BLOOD (SINGLE): Organism ID, Bacteria: NO GROWTH

## 2012-01-17 ENCOUNTER — Ambulatory Visit (INDEPENDENT_AMBULATORY_CARE_PROVIDER_SITE_OTHER): Payer: Medicare Other | Admitting: Urology

## 2012-01-17 DIAGNOSIS — C61 Malignant neoplasm of prostate: Secondary | ICD-10-CM

## 2012-01-17 DIAGNOSIS — N320 Bladder-neck obstruction: Secondary | ICD-10-CM

## 2012-04-20 ENCOUNTER — Ambulatory Visit: Payer: Self-pay | Admitting: Internal Medicine

## 2012-05-14 ENCOUNTER — Telehealth: Payer: Self-pay | Admitting: *Deleted

## 2012-05-14 NOTE — Telephone Encounter (Signed)
Patient called, stating he feels "his knee infection" is back.  At his last visit 12/19/2011, he was advised to call if he had any recurrence.  Patient calling today reporting soreness, pain at bending, difficulty walking.  Per patient's wife, he has doxycycline 100mg  at home and started taking them BID 05/13/12 when the symptoms began.  He has an appointment on 05/22/12 to have 3 teeth pulled.  He would like evaluation before that procedure.  Appointment given with Dr. Ninetta Lights for 05/16/12 at 9:15. Andree Coss, RN

## 2012-05-16 ENCOUNTER — Ambulatory Visit (INDEPENDENT_AMBULATORY_CARE_PROVIDER_SITE_OTHER): Payer: Medicare Other | Admitting: Infectious Diseases

## 2012-05-16 ENCOUNTER — Encounter: Payer: Self-pay | Admitting: Infectious Diseases

## 2012-05-16 VITALS — BP 164/87 | HR 74 | Temp 97.8°F | Ht 69.0 in | Wt 181.0 lb

## 2012-05-16 DIAGNOSIS — M7041 Prepatellar bursitis, right knee: Secondary | ICD-10-CM

## 2012-05-16 DIAGNOSIS — M7040 Prepatellar bursitis, unspecified knee: Secondary | ICD-10-CM

## 2012-05-16 MED ORDER — DOXYCYCLINE HYCLATE 100 MG PO TABS: 100.0000 mg | ORAL_TABLET | Freq: Two times a day (BID) | ORAL | Status: DC

## 2012-05-16 NOTE — Assessment & Plan Note (Signed)
I am not clear that his knee is infected. I will have him seen at ortho for eval for an arthrocentesis if possible to further eval this. I have asked him to stay off anbx until this is done. He can resume his doxy after seen by ortho, through he has dental appt. Will see him back in 2-3 weeks.

## 2012-05-16 NOTE — Telephone Encounter (Signed)
Thanks, seeing him now

## 2012-05-16 NOTE — Progress Notes (Signed)
  Subjective:    Patient ID: Cody George, male    DOB: 1930/03/10, 79 y.o.   MRN: [id number]  HPI 79 yo M with hx of L knee infection in April 22, 2011. He was seen by ortho (Dr Farris Has) at that time and was given anbx at that time (keflex, plain films -, prepatellar bursitis). He has since had infection in his R knee (11-07-11) and again received anbx (keflex, prepatellar bursitis, plain films -). His knee infection resolved. He again developed pain (11-30-11) in infection in his L knee and was put on anbx (ceftriaxone injectionkeflex, prepatellar bursitis).   Seen in ID clinic November 2013 and was started on doxy and mupirocin to try to decolonize him.   He returns today with increasing pain in his R knee. Has had limitation of movement of his knee. He restarted his doxy rx on 3-30 (now out). No fevers or chills. Feels warm. No swelling.  Is concerned that he has dental appt next week for infected teeth and that his knee infection could get into his teeth. Wants to know if there are any anbx that don't cause diarrhea.     Review of Systems     Objective:   Physical Exam  Constitutional: He appears well-nourished.  Musculoskeletal:       Legs:         Assessment & Plan:

## 2012-05-21 ENCOUNTER — Other Ambulatory Visit: Payer: Self-pay | Admitting: Infectious Diseases

## 2012-05-21 DIAGNOSIS — M7040 Prepatellar bursitis, unspecified knee: Secondary | ICD-10-CM

## 2012-05-30 ENCOUNTER — Ambulatory Visit: Payer: Medicare Other | Admitting: Infectious Diseases

## 2012-06-04 ENCOUNTER — Encounter: Payer: Self-pay | Admitting: *Deleted

## 2012-06-16 ENCOUNTER — Other Ambulatory Visit: Payer: Self-pay | Admitting: Cardiovascular Disease

## 2012-06-16 LAB — PACEMAKER DEVICE OBSERVATION

## 2012-06-27 ENCOUNTER — Ambulatory Visit (INDEPENDENT_AMBULATORY_CARE_PROVIDER_SITE_OTHER): Payer: Medicare Other | Admitting: Pharmacist Clinician (PhC)/ Clinical Pharmacy Specialist

## 2012-06-27 DIAGNOSIS — Z7901 Long term (current) use of anticoagulants: Secondary | ICD-10-CM

## 2012-06-27 DIAGNOSIS — I4891 Unspecified atrial fibrillation: Secondary | ICD-10-CM

## 2012-06-27 LAB — POCT INR: INR: 2.8

## 2012-07-02 LAB — REMOTE PACEMAKER DEVICE
AL AMPLITUDE: 3.2 mv
BAMS-0001: 170 {beats}/min
BATTERY VOLTAGE: 2.95 V
RV LEAD AMPLITUDE: 12 mv
RV LEAD IMPEDENCE PM: 480 Ohm
RV LEAD THRESHOLD: 1 V

## 2012-07-17 ENCOUNTER — Ambulatory Visit (INDEPENDENT_AMBULATORY_CARE_PROVIDER_SITE_OTHER): Payer: Medicare Other | Admitting: Urology

## 2012-07-17 DIAGNOSIS — C61 Malignant neoplasm of prostate: Secondary | ICD-10-CM

## 2012-07-25 ENCOUNTER — Ambulatory Visit: Payer: Medicare Other

## 2012-07-31 ENCOUNTER — Other Ambulatory Visit: Payer: Self-pay | Admitting: Internal Medicine

## 2012-09-15 ENCOUNTER — Other Ambulatory Visit: Payer: Self-pay | Admitting: Cardiovascular Disease

## 2012-09-15 DIAGNOSIS — I495 Sick sinus syndrome: Secondary | ICD-10-CM

## 2012-09-26 ENCOUNTER — Other Ambulatory Visit: Payer: Self-pay | Admitting: Internal Medicine

## 2012-10-04 ENCOUNTER — Encounter: Payer: Self-pay | Admitting: *Deleted

## 2012-10-04 LAB — REMOTE PACEMAKER DEVICE
AL AMPLITUDE: 2.6 mv
AL IMPEDENCE PM: 430 Ohm
ATRIAL PACING PM: 85
BATTERY VOLTAGE: 2.95 V
BRDY-0004RA: 120 {beats}/min
RV LEAD IMPEDENCE PM: 530 Ohm
VENTRICULAR PACING PM: 5.7

## 2012-10-29 ENCOUNTER — Other Ambulatory Visit: Payer: Self-pay | Admitting: Pharmacist Clinician (PhC)/ Clinical Pharmacy Specialist

## 2012-11-12 ENCOUNTER — Other Ambulatory Visit: Payer: Self-pay | Admitting: Internal Medicine

## 2012-11-12 NOTE — Telephone Encounter (Signed)
Rx was sent to pharmacy electronically. 

## 2012-11-19 ENCOUNTER — Encounter: Payer: Self-pay | Admitting: Cardiovascular Disease

## 2012-11-27 ENCOUNTER — Other Ambulatory Visit: Payer: Self-pay | Admitting: Pharmacist Clinician (PhC)/ Clinical Pharmacy Specialist

## 2012-11-27 MED ORDER — WARFARIN SODIUM 10 MG PO TABS: ORAL_TABLET | ORAL | Status: DC

## 2012-12-03 ENCOUNTER — Encounter: Payer: Self-pay | Admitting: *Deleted

## 2012-12-06 ENCOUNTER — Encounter: Payer: Self-pay | Admitting: Internal Medicine

## 2012-12-06 ENCOUNTER — Ambulatory Visit (INDEPENDENT_AMBULATORY_CARE_PROVIDER_SITE_OTHER): Payer: Medicare Other | Admitting: Internal Medicine

## 2012-12-06 ENCOUNTER — Ambulatory Visit (INDEPENDENT_AMBULATORY_CARE_PROVIDER_SITE_OTHER): Payer: Medicare Other | Admitting: Pharmacist Clinician (PhC)/ Clinical Pharmacy Specialist

## 2012-12-06 VITALS — BP 154/90 | HR 76 | Ht 69.0 in | Wt 174.3 lb

## 2012-12-06 DIAGNOSIS — Z7901 Long term (current) use of anticoagulants: Secondary | ICD-10-CM

## 2012-12-06 DIAGNOSIS — R0989 Other specified symptoms and signs involving the circulatory and respiratory systems: Secondary | ICD-10-CM | POA: Insufficient documentation

## 2012-12-06 DIAGNOSIS — I455 Other specified heart block: Secondary | ICD-10-CM

## 2012-12-06 DIAGNOSIS — I480 Paroxysmal atrial fibrillation: Secondary | ICD-10-CM

## 2012-12-06 DIAGNOSIS — I4891 Unspecified atrial fibrillation: Secondary | ICD-10-CM

## 2012-12-06 DIAGNOSIS — Z950 Presence of cardiac pacemaker: Secondary | ICD-10-CM

## 2012-12-06 DIAGNOSIS — I10 Essential (primary) hypertension: Secondary | ICD-10-CM

## 2012-12-06 DIAGNOSIS — Z13220 Encounter for screening for lipoid disorders: Secondary | ICD-10-CM

## 2012-12-06 DIAGNOSIS — Z79899 Other long term (current) drug therapy: Secondary | ICD-10-CM

## 2012-12-06 MED ORDER — APIXABAN 5 MG PO TABS: 5.0000 mg | ORAL_TABLET | Freq: Two times a day (BID) | ORAL | Status: DC

## 2012-12-06 NOTE — Patient Instructions (Addendum)
Your physician has recommended you make the following change in your medication: STOP coumadin. START Eliquis 5mg  twice daily.  Please have blood work done in the next few weeks - you will need to be fasting (nothing to eat/drink after midnight prior) You can use the [facility] in [place].  Your physician wants you to follow-up in: 6 months with Dr. Georgeann Oppenheim will receive a reminder letter in the mail two months in advance. If you don't receive a letter, please call our office to schedule the follow-up appointment.

## 2012-12-06 NOTE — Progress Notes (Signed)
OFFICE NOTE  Chief Complaint:  Routine follow-up  Primary Care Physician: Catalina Pizza, MD  HPI:  Cody George is an 79 year old gentleman who has a history of atrial fibrillation and tachy-brady syndrome as well as paroxysmal atrial fibrillation and sinus node arrest. He underwent a St. Jude Accent DR dual-chamber pacemaker in June of 2013. He has done fairly well other than he had some hypotension on lisinopril which was discontinued. He has recently had some tachyarrhythmias and had increased his metoprolol succinate to 25 mg daily due to tachyarrhythmias. He is enrolled in a home monitoring service with the [facility] patient care network. This demonstrated 327 episodes, or a AT/AF burden of 5.9% with some competitive pacing and VA conduction. At his last visit his PVARP was reduced to 275 msec from 375 msec. Today it was noted that the atrial refractory period was 273 msec. On interrogation the patient had 504 mode switches, and the overall AT/AF burden was 5.1%. There was a peak A rate of 640 beats per minute on May 23, 2012 and a V rate of 167 which were short-lived. There was 1 episode of fast upper rate for which he was completely unaware of that lasted for about 18 hours. Overall he is atrial paced 91% of the time and V-paced 1.6% of the time. He is currently programmed to DDDR with a low rate of 60, upper rate of 100. Both lead impedances are within normal limits. Battery life is 2.95 volts. His underlying rhythm is sinus rhythm at 50. He continues to be asymptomatic and feels much better since he's had his pacemaker placed. He has noticed his blood pressures been slightly labile at times at home ranges from the 110 systolic up to the 150s. Finally today, he is asking about whether a novel oral anticoagulant would be a better option than warfarin for him.  PMHx:  Past Medical History  Diagnosis Date  . Hypertension   . Dysrhythmia     PAF  . GERD (gastroesophageal reflux disease)     . Prostate cancer     radiation  . Arthritis   . Cellulitis   . Pacemaker 07/22/2011    St. 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 OA (osteoarthritis)   . History of nuclear stress test 10/05/2010    dipyridamole; normal pattern of perfusion in all regions; no significant ishcemia, low risk scan     Past Surgical History  Procedure Laterality Date  . Hernia repair    . Pacemaker insertion  07/22/2011    St. 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 Transthoracic echocardiogram  10/05/2010    EF=50-55%, normal LV systolic function; LA & RA mildly dilated; mild MR; mild TR with RV systolic pressure elevted at 30-68mmHg; AV mildly sclerotic with mild calcif of AV leaflets, mild valvular AS, mild-mod regurg; trace pulm valve regurg    FAMHx:  No family history on file.  SOCHx:   reports that he has been smoking.  He has never used smokeless tobacco. He reports that he does not drink alcohol or use illicit drugs.  ALLERGIES:  No Known Allergies  ROS: A comprehensive review of systems was negative.  HOME MEDS: Current Outpatient Prescriptions  Medication Sig Dispense Refill  . ferrous sulfate 325 (65 FE) MG tablet Take 325 mg by mouth daily with breakfast.      . metoprolol succinate (TOPROL-XL) 25 MG 24 hr tablet  TAKE ONE TABLET BY MOUTH EVERY DAY  30 tablet  7  . OVER THE COUNTER MEDICATION Take 4-5 oz by mouth daily. Cherry Juice      . pantoprazole (PROTONIX) 40 MG tablet Take 1 tablet by mouth daily.      Marland Kitchen VITAMIN C, CALCIUM ASCORBATE, PO Take 1,000 mg by mouth daily.       Marland Kitchen VITAMIN D, CHOLECALCIFEROL, PO Take 1,000 mg by mouth daily.       Marland Kitchen apixaban (ELIQUIS) 5 MG TABS tablet Take 1 tablet (5 mg total) by mouth 2 (two) times daily.  60 tablet  11   No current facility-administered medications for this visit.    LABS/IMAGING: No results found for this or any previous visit (from the past  48 hour(s)). No results found.  VITALS: BP 154/90  Pulse 76  Ht 5\' 9"  (1.753 m)  Wt 174 lb 4.8 oz (79.062 kg)  BMI 25.73 kg/m2  EXAM: General appearance: alert and no distress Neck: no carotid bruit and no JVD Lungs: clear to auscultation bilaterally Heart: regular rate and rhythm, S1, S2 normal, no murmur, click, rub or gallop Abdomen: soft, non-tender; bowel sounds normal; no masses,  no organomegaly Extremities: extremities normal, atraumatic, no cyanosis or edema Pulses: 2+ and symmetric Skin: Skin color, texture, turgor normal. No rashes or lesions Neurologic: Grossly normal Psych: Mood, affect normal, pleasant  EKG: Atrial paced rhythm at 76  ASSESSMENT: 1. Paroxysmal atrial fibrillation with sinus node arrest 2. Status post St. Jude Accent DR pacemaker 3. Labile hypertension 4. Chronic anticoagulation  PLAN: 1.   Mr. Dozal is doing well and has a generally stable INR is on warfarin. Recently he had an episode where he had a low INR and attributed to eating too much greens. He is inquiring based on television advertisement about switching to a novel oral anticoagulant. I discussed several options with him and our pharmacist in the office today and we have elected to switch him to Eliquis 5 mg by mouth twice a day.  As which will be coordinated by our pharmacist. He was informed of possible side effects of the medication and that the medicine is as potent of a blood thinner is warfarin therefore he should be vigilant about cutting himself or get evaluated if he were to sustain a head injury.  Otherwise I will not adjust his medications now for his labile blood pressures, but this should be monitored as he may need to go back on additional medication. We will go and check a lipid profile for screening as well as a metabolic profile after starting the Eliquis. He is scheduled to see Dr. Salena Saner next month for pacemaker check and I would like to see him back in 6 months for ongoing  care.    Chrystie Nose, MD, [facility] Attending Cardiologist [facility]  HILTY,Kenneth C 12/06/2012, 1:38 PM

## 2012-12-11 ENCOUNTER — Telehealth: Payer: Self-pay | Admitting: Internal Medicine

## 2012-12-11 NOTE — Telephone Encounter (Signed)
Please call him  Regarding eliquis.free months supply

## 2012-12-11 NOTE — Telephone Encounter (Signed)
Needed phone # to activate free savings card - pt found before returned call.

## 2012-12-15 LAB — PACEMAKER DEVICE OBSERVATION

## 2012-12-17 ENCOUNTER — Ambulatory Visit (INDEPENDENT_AMBULATORY_CARE_PROVIDER_SITE_OTHER): Payer: Medicare Other

## 2012-12-17 DIAGNOSIS — I4891 Unspecified atrial fibrillation: Secondary | ICD-10-CM

## 2012-12-17 DIAGNOSIS — I480 Paroxysmal atrial fibrillation: Secondary | ICD-10-CM

## 2012-12-17 DIAGNOSIS — I455 Other specified heart block: Secondary | ICD-10-CM

## 2012-12-21 LAB — COMPREHENSIVE METABOLIC PANEL
Albumin: 3.8 g/dL (ref 3.5–5.2)
Alkaline Phosphatase: 61 U/L (ref 39–117)
BUN: 18 mg/dL (ref 6–23)
CO2: 29 mEq/L (ref 19–32)
Calcium: 9.5 mg/dL (ref 8.4–10.5)
Chloride: 100 mEq/L (ref 96–112)
Glucose, Bld: 113 mg/dL — ABNORMAL HIGH (ref 70–99)
Potassium: 5.8 mEq/L — ABNORMAL HIGH (ref 3.5–5.3)

## 2012-12-21 LAB — LIPID PANEL
Cholesterol: 131 mg/dL (ref 0–200)
HDL: 53 mg/dL (ref >39)
Triglycerides: 68 mg/dL (ref <150)
VLDL: 14 mg/dL (ref 0–40)

## 2012-12-25 ENCOUNTER — Encounter: Payer: Self-pay | Admitting: *Deleted

## 2012-12-25 ENCOUNTER — Telehealth: Payer: Self-pay

## 2012-12-25 MED ORDER — APIXABAN 5 MG PO TABS: 5.0000 mg | ORAL_TABLET | Freq: Two times a day (BID) | ORAL | Status: DC

## 2012-12-25 NOTE — Telephone Encounter (Signed)
Prior Authorization for Eliquis 5mg  1 tablet PO BID is approved through 12/25/2013.  Reference # - [id number]  Patient's wife notified.

## 2012-12-25 NOTE — Telephone Encounter (Signed)
Prior Authorization faxed to [facility] on 12/25/2012. Called patient and spoke with his wife and notified her of the process.  Notified patient's wife that I left 4 sample packs of Eliquis at the front desk in case he ran out of his medication - per wife, he had enough to last through Friday.

## 2012-12-31 ENCOUNTER — Encounter: Payer: Self-pay | Admitting: *Deleted

## 2012-12-31 LAB — MDC_IDC_ENUM_SESS_TYPE_REMOTE
Battery Remaining Longevity: 6.9
Brady Statistic RA Percent Paced: 87 %
Brady Statistic RV Percent Paced: 5.2 %
Implantable Pulse Generator Model: 2210
Implantable Pulse Generator Serial Number: 7353349
Lead Channel Impedance Value: 440 Ohm
Lead Channel Impedance Value: 440 Ohm
Lead Channel Pacing Threshold Pulse Width: 0.4 ms
Lead Channel Sensing Intrinsic Amplitude: 12 mV
Lead Channel Sensing Intrinsic Amplitude: 3.1 mV
Lead Channel Setting Pacing Amplitude: 1.875
Lead Channel Setting Pacing Pulse Width: 0.4 ms

## 2013-01-02 ENCOUNTER — Encounter: Payer: Self-pay | Admitting: *Deleted

## 2013-01-03 ENCOUNTER — Ambulatory Visit (INDEPENDENT_AMBULATORY_CARE_PROVIDER_SITE_OTHER): Payer: Medicare Other | Admitting: *Deleted

## 2013-01-03 ENCOUNTER — Ambulatory Visit (INDEPENDENT_AMBULATORY_CARE_PROVIDER_SITE_OTHER): Payer: Medicare Other | Admitting: Cardiovascular Disease

## 2013-01-03 ENCOUNTER — Encounter: Payer: Self-pay | Admitting: Cardiovascular Disease

## 2013-01-03 VITALS — BP 130/80 | HR 72 | Ht 69.0 in | Wt 174.4 lb

## 2013-01-03 DIAGNOSIS — Z950 Presence of cardiac pacemaker: Secondary | ICD-10-CM

## 2013-01-03 DIAGNOSIS — I480 Paroxysmal atrial fibrillation: Secondary | ICD-10-CM

## 2013-01-03 DIAGNOSIS — I4891 Unspecified atrial fibrillation: Secondary | ICD-10-CM

## 2013-01-03 DIAGNOSIS — I455 Other specified heart block: Secondary | ICD-10-CM

## 2013-01-03 LAB — MDC_IDC_ENUM_SESS_TYPE_INCLINIC
Brady Statistic RA Percent Paced: 87 %
Brady Statistic RV Percent Paced: 4.9 %
Lead Channel Impedance Value: 450 Ohm
Lead Channel Pacing Threshold Amplitude: 0.875 V
Lead Channel Pacing Threshold Pulse Width: 0.4 ms
Lead Channel Pacing Threshold Pulse Width: 0.4 ms
Lead Channel Sensing Intrinsic Amplitude: 4.3 mV
Lead Channel Setting Pacing Amplitude: 1.875
Lead Channel Setting Pacing Amplitude: 2.5 V

## 2013-01-03 LAB — PACEMAKER DEVICE OBSERVATION

## 2013-01-03 NOTE — Progress Notes (Signed)
Pacemaker check in clinic. Normal device function. Thresholds, sensing, impedances consistent with previous measurements. Device programmed to maximize longevity. 837 mode switches (4.7%)---max dur. 15 hours, Max A 640, Max V 149---last 01-03-2013---episodes appear to be AF (+Eliquis) and some competative pacing. No high ventricular rates noted. Device programmed at appropriate safety margins. Histogram distribution appropriate for patient activity level. Device programmed to optimize intrinsic conduction. Estimated longevity >7 years. Patient will follow up remotely as scheduled, and with [facility] in 1 year.

## 2013-01-03 NOTE — Progress Notes (Signed)
Patient ID: Cody George, male   DOB: Sep 12, 1930, 79 y.o.   MRN: [id number]      Reason for office visit Pacemaker, paroxysmal atrial fibrillation  His aggression is feeling well. He is unaware of the episodes of atrial fibrillation recorded by his pacemaker. These always had good ventricular rate control. He has not had any bleeding or embolic events. He is tolerating treatment with Eliquis. He has only missed one of his evening pills so far, but does have some difficulty recalling to take the second dose at times.   Pacemaker check shows an overall burden of atrial fibrillation of about 5%. Atrial pacing except 87% of beats. Device function is normal. There is very infrequent ventricular pacing.   No Known Allergies  Current Outpatient Prescriptions  Medication Sig Dispense Refill  . apixaban (ELIQUIS) 5 MG TABS tablet Take 1 tablet (5 mg total) by mouth 2 (two) times daily.  56 tablet  0  . ferrous sulfate 325 (65 FE) MG tablet Take 325 mg by mouth daily with breakfast.      . metoprolol succinate (TOPROL-XL) 25 MG 24 hr tablet TAKE ONE TABLET BY MOUTH EVERY DAY  30 tablet  7  . OVER THE COUNTER MEDICATION Take 4-5 oz by mouth daily. Cherry Juice      . pantoprazole (PROTONIX) 40 MG tablet Take 1 tablet by mouth daily.      . vitamin B-12 (CYANOCOBALAMIN) 1000 MCG tablet Take 1,000 mcg by mouth daily.      Marland Kitchen VITAMIN C, CALCIUM ASCORBATE, PO Take 1,000 mg by mouth daily.       Marland Kitchen VITAMIN D, CHOLECALCIFEROL, PO Take 1,000 mg by mouth daily.        No current facility-administered medications for this visit.    Past Medical History  Diagnosis Date  . Hypertension   . Dysrhythmia     PAF  . GERD (gastroesophageal reflux disease)   . Prostate cancer     radiation  . Arthritis   . Cellulitis   . Pacemaker 07/22/2011    St. 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 OA (osteoarthritis)   . History of nuclear stress test 10/05/2010   dipyridamole; normal pattern of perfusion in all regions; no significant ishcemia, low risk scan   . Paroxysmal atrial fibrillation     Past Surgical History  Procedure Laterality Date  . Hernia repair    . Pacemaker insertion  07/22/2011    St. 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 Transthoracic echocardiogram  10/05/2010    EF=50-55%, normal LV systolic function; LA & RA mildly dilated; mild MR; mild TR with RV systolic pressure elevted at 30-55mmHg; AV mildly sclerotic with mild calcif of AV leaflets, mild valvular AS, mild-mod regurg; trace pulm valve regurg  . US echocardiography  10/05/10    LA mildly dilated, mild TR, mild ca+ of AOV ,mild to mod. AI  . Nm myocar perf wall motion  10/05/10    normal    No family history on file.  History   Social History  . Marital Status: Married    Spouse Name: N/A    Number of Children: 5  . Years of Education: N/A   Occupational History  .     Social History Main Topics  . Smoking status: Current Every Day Smoker -- 1.00 packs/day for 65 years  . Smokeless tobacco: Never Used  Comment: now 3/4 ppd  . Alcohol Use: No  . Drug Use: No  . Sexual Activity: Not Currently   Other Topics Concern  . Not on file   Social History Narrative  . No narrative on file    Review of systems: The patient specifically denies any chest pain at rest or with exertion, dyspnea at rest or with exertion, orthopnea, paroxysmal nocturnal dyspnea, syncope, palpitations, focal neurological deficits, intermittent claudication, lower extremity edema, unexplained weight gain, cough, hemoptysis or wheezing.  The patient also denies abdominal pain, nausea, vomiting, dysphagia, diarrhea, constipation, polyuria, polydipsia, dysuria, hematuria, frequency, urgency, abnormal bleeding or bruising, fever, chills, unexpected weight changes, mood swings, change in skin or hair texture, change in voice quality, auditory or  visual problems, allergic reactions or rashes, new musculoskeletal complaints other than usual "aches and pains".   PHYSICAL EXAM BP 130/80  Pulse 72  Ht 5\' 9"  (1.753 m)  Wt 174 lb 6.4 oz (79.107 kg)  BMI 25.74 kg/m2  General: Alert, oriented x3, no distress Head: no evidence of trauma, PERRL, EOMI, no exophtalmos or lid lag, no myxedema, no xanthelasma; normal ears, nose and oropharynx Neck: normal jugular venous pulsations and no hepatojugular reflux; brisk carotid pulses without delay and no carotid bruits Chest: clear to auscultation, no signs of consolidation by percussion or palpation, normal fremitus, symmetrical and full respiratory excursions Cardiovascular: normal position and quality of the apical impulse, regular rhythm, normal first and second heart sounds, no murmurs, rubs or gallops Abdomen: no tenderness or distention, no masses by palpation, no abnormal pulsatility or arterial bruits, normal bowel sounds, no hepatosplenomegaly Extremities: no clubbing, cyanosis or edema; 2+ radial, ulnar and brachial pulses bilaterally; 2+ right femoral, posterior tibial and dorsalis pedis pulses; 2+ left femoral, posterior tibial and dorsalis pedis pulses; no subclavian or femoral bruits Neurological: grossly nonfocal healthy left subclavian pacemaker site  Lipid Panel     Component Value Date/Time   CHOL 131 12/21/2012 0820   TRIG 68 12/21/2012 0820   HDL 53 12/21/2012 0820   CHOLHDL 2.5 12/21/2012 0820   VLDL 14 12/21/2012 0820   LDLCALC 64 12/21/2012 0820    BMET    Component Value Date/Time   NA 136 12/21/2012 0820   K 5.8* 12/21/2012 0820   CL 100 12/21/2012 0820   CO2 29 12/21/2012 0820   GLUCOSE 113* 12/21/2012 0820   BUN 18 12/21/2012 0820   CREATININE 1.08 12/21/2012 0820   CALCIUM 9.5 12/21/2012 0820     ASSESSMENT AND PLAN Pacemaker - St Jude Accent DR RF June 2013, sinus node arrest Pacemaker check in clinic. Normal device function. Thresholds, sensing, impedances  consistent with previous measurements. 87% atrial pacing and only 5% ventricular pacing. 837 mode switches (4.7%)---max dur. 15 hours, Max A 640, Max V 149---last  01-03-2013---episodes appear to be AF (+Eliquis) and some competative pacing. No high ventricular rates noted. Device programmed at appropriate safety margins. Histogram distribution appropriate for patient activity level. Device programmed to optimize  intrinsic conduction. Estimated longevity >7 years. Patient will follow up remotely as scheduled, and with me in 1 year.   Paroxysmal a-fib He tolerates anticoagulation without bleeding complications. Reinforced the need for compliance because of the short acting nature of the medication. Antiarrhythmic therapy does not appear to be justified since the episode of atrial fibrillation and a particular symptomatic   No orders of the defined types were placed in this encounter.   Meds ordered this encounter  Medications  . vitamin B-12 (  CYANOCOBALAMIN) 1000 MCG tablet    Sig: Take 1,000 mcg by mouth daily.    Junious Silk, MD, [facility] [phone] office [phone] pager

## 2013-01-03 NOTE — Patient Instructions (Signed)
Remote monitoring is used to monitor your pacemaker from home. This monitoring reduces the number of office visits required to check your device to one time per year. It allows [place] to keep an eye on the functioning of your device to ensure it is working properly. You are scheduled for a device check from home on 03-18-2013. You may send your transmission at any time that day. If you have a wireless device, the transmission will be sent automatically. After your physician reviews your transmission, you will receive a postcard with your next transmission date.    Your physician recommends that you schedule a follow-up appointment in: 12 months

## 2013-01-03 NOTE — Assessment & Plan Note (Addendum)
Pacemaker check in clinic. Normal device function. Thresholds, sensing, impedances consistent with previous measurements. 87% atrial pacing and only 5% ventricular pacing. 837 mode switches (4.7%)---max dur. 15 hours, Max A 640, Max V 149---last  01-03-2013---episodes appear to be AF (+Eliquis) and some competative pacing. No high ventricular rates noted. Device programmed at appropriate safety margins. Histogram distribution appropriate for patient activity level. Device programmed to optimize  intrinsic conduction. Estimated longevity >7 years. Patient will follow up remotely as scheduled, and with me in 1 year.

## 2013-01-03 NOTE — Assessment & Plan Note (Signed)
He tolerates anticoagulation without bleeding complications. Reinforced the need for compliance because of the short acting nature of the medication. Antiarrhythmic therapy does not appear to be justified since the episode of atrial fibrillation and a particular symptomatic

## 2013-03-18 ENCOUNTER — Ambulatory Visit (INDEPENDENT_AMBULATORY_CARE_PROVIDER_SITE_OTHER): Payer: Medicare Other | Admitting: *Deleted

## 2013-03-18 ENCOUNTER — Encounter: Payer: Self-pay | Admitting: Cardiovascular Disease

## 2013-03-18 DIAGNOSIS — I480 Paroxysmal atrial fibrillation: Secondary | ICD-10-CM

## 2013-03-18 DIAGNOSIS — I4891 Unspecified atrial fibrillation: Secondary | ICD-10-CM

## 2013-03-18 DIAGNOSIS — I455 Other specified heart block: Secondary | ICD-10-CM

## 2013-03-18 LAB — MDC_IDC_ENUM_SESS_TYPE_REMOTE
Battery Remaining Longevity: 84 mo
Brady Statistic AP VS Percent: 91 %
Brady Statistic RA Percent Paced: 87 %
Brady Statistic RV Percent Paced: 5.2 %
Date Time Interrogation Session: 20150202084041
Lead Channel Impedance Value: 450 Ohm
Lead Channel Pacing Threshold Amplitude: 1 V
Lead Channel Pacing Threshold Amplitude: 1 V
Lead Channel Sensing Intrinsic Amplitude: 5 mV
Lead Channel Setting Pacing Amplitude: 2 V
Lead Channel Setting Pacing Amplitude: 2.5 V
Lead Channel Setting Sensing Sensitivity: 2 mV
MDC IDC MSMT BATTERY VOLTAGE: 2.95 V
MDC IDC MSMT LEADCHNL RA IMPEDANCE VALUE: 430 Ohm
MDC IDC MSMT LEADCHNL RA PACING THRESHOLD PULSEWIDTH: 0.4 ms
MDC IDC MSMT LEADCHNL RV PACING THRESHOLD PULSEWIDTH: 0.4 ms
MDC IDC MSMT LEADCHNL RV SENSING INTR AMPL: 12 mV
MDC IDC PG SERIAL: 7353349
MDC IDC SET LEADCHNL RV PACING PULSEWIDTH: 0.4 ms
MDC IDC STAT BRADY AP VP PERCENT: 3.3 %
MDC IDC STAT BRADY AS VP PERCENT: 1 %
MDC IDC STAT BRADY AS VS PERCENT: 4.9 %

## 2013-03-26 ENCOUNTER — Other Ambulatory Visit: Payer: Self-pay | Admitting: Cardiovascular Disease

## 2013-04-09 ENCOUNTER — Ambulatory Visit (INDEPENDENT_AMBULATORY_CARE_PROVIDER_SITE_OTHER): Payer: Medicare Other | Admitting: Urology

## 2013-04-09 DIAGNOSIS — R351 Nocturia: Secondary | ICD-10-CM

## 2013-04-09 DIAGNOSIS — C61 Malignant neoplasm of prostate: Secondary | ICD-10-CM

## 2013-04-15 ENCOUNTER — Encounter: Payer: Self-pay | Admitting: *Deleted

## 2013-04-24 ENCOUNTER — Other Ambulatory Visit: Payer: Self-pay | Admitting: Internal Medicine

## 2013-04-24 NOTE — Telephone Encounter (Signed)
Rx was sent to pharmacy electronically. 

## 2013-06-13 ENCOUNTER — Encounter: Payer: Self-pay | Admitting: Internal Medicine

## 2013-06-13 ENCOUNTER — Ambulatory Visit (INDEPENDENT_AMBULATORY_CARE_PROVIDER_SITE_OTHER): Payer: Medicare Other | Admitting: Internal Medicine

## 2013-06-13 VITALS — BP 122/80 | HR 98 | Ht 69.0 in | Wt 171.2 lb

## 2013-06-13 DIAGNOSIS — Z950 Presence of cardiac pacemaker: Secondary | ICD-10-CM

## 2013-06-13 DIAGNOSIS — I455 Other specified heart block: Secondary | ICD-10-CM

## 2013-06-13 DIAGNOSIS — I4891 Unspecified atrial fibrillation: Secondary | ICD-10-CM

## 2013-06-13 DIAGNOSIS — Z7901 Long term (current) use of anticoagulants: Secondary | ICD-10-CM

## 2013-06-13 NOTE — Progress Notes (Signed)
OFFICE NOTE  Chief Complaint:  Routine follow-up  Primary Care Physician: Delphina Cahill, MD  HPI:  Cody George is an 80 year old gentleman who has a history of atrial fibrillation and tachy-brady syndrome as well as paroxysmal atrial fibrillation and sinus node arrest. He underwent a St. Jude Accent DR dual-chamber pacemaker in June of 2013. He has done fairly well other than he had some hypotension on lisinopril which was discontinued. He has recently had some tachyarrhythmias and had increased his metoprolol succinate to 25 mg daily due to tachyarrhythmias. He is enrolled in a home monitoring service with the [facility] patient care network. He was successfully transitioned over to Eliquis she is taking without any bleeding difficulty. He denies any shortness of breath or worsening chest pain. He is due for a remote pacer check in a few days.  PMHx:  Past Medical History  Diagnosis Date  . Hypertension   . Dysrhythmia     PAF  . GERD (gastroesophageal reflux disease)   . Prostate cancer     radiation  . Arthritis   . Cellulitis   . Pacemaker 07/22/2011    St. 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 OA (osteoarthritis)   . History of nuclear stress test 10/05/2010    dipyridamole; normal pattern of perfusion in all regions; no significant ishcemia, low risk scan   . Paroxysmal atrial fibrillation     Past Surgical History  Procedure Laterality Date  . Hernia repair    . Pacemaker insertion  07/22/2011    St. 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 Transthoracic echocardiogram  10/05/2010    EF=50-55%, normal LV systolic function; LA & RA mildly dilated; mild MR; mild TR with RV systolic pressure elevted at 30-47mmHg; AV mildly sclerotic with mild calcif of AV leaflets, mild valvular AS, mild-mod regurg; trace pulm valve regurg  . US echocardiography  10/05/10    LA mildly dilated, mild TR,  mild ca+ of AOV ,mild to mod. AI  . Nm myocar perf wall motion  10/05/10    normal    FAMHx:  History reviewed. No pertinent family history.  SOCHx:   reports that he has been smoking.  He has never used smokeless tobacco. He reports that he does not drink alcohol or use illicit drugs.  ALLERGIES:  No Known Allergies  ROS: A comprehensive review of systems was negative.  HOME MEDS: Current Outpatient Prescriptions  Medication Sig Dispense Refill  . apixaban (ELIQUIS) 5 MG TABS tablet Take 1 tablet (5 mg total) by mouth 2 (two) times daily.  56 tablet  0  . ferrous sulfate 325 (65 FE) MG tablet Take 325 mg by mouth daily with breakfast.      . hydrochlorothiazide (HYDRODIURIL) 25 MG tablet Take 1 tablet by mouth daily.      . metoprolol succinate (TOPROL-XL) 25 MG 24 hr tablet TAKE ONE (1) TABLET BY MOUTH EVERY DAY  30 tablet  8  . OVER THE COUNTER MEDICATION Take 4-5 oz by mouth daily. Cherry Juice      . pantoprazole (PROTONIX) 40 MG tablet Take 1 tablet by mouth daily.      . vitamin B-12 (CYANOCOBALAMIN) 1000 MCG tablet Take 1,000 mcg by mouth daily.      Marland Kitchen VITAMIN C, CALCIUM ASCORBATE, PO Take 1,000 mg by mouth daily.       Marland Kitchen VITAMIN D, CHOLECALCIFEROL, PO Take  1,000 mg by mouth daily.        No current facility-administered medications for this visit.    LABS/IMAGING: No results found for this or any previous visit (from the past 48 hour(s)). No results found.  VITALS: BP 122/80  Pulse 98  Ht 5\' 9"  (1.753 m)  Wt 171 lb 3.2 oz (77.656 kg)  BMI 25.27 kg/m2  EXAM: General appearance: alert and no distress Neck: no carotid bruit and no JVD Lungs: clear to auscultation bilaterally Heart: regular rate and rhythm, S1, S2 normal, no murmur, click, rub or gallop Abdomen: soft, non-tender; bowel sounds normal; no masses,  no organomegaly Extremities: extremities normal, atraumatic, no cyanosis or edema Pulses: 2+ and symmetric Skin: Skin color, texture, turgor normal. No  rashes or lesions Neurologic: Grossly normal Psych: Mood, affect normal, pleasant  EKG: Sinus rhythm with PAC's at 98  ASSESSMENT: 1. Paroxysmal atrial fibrillation with sinus node arrest 2. Status post St. Jude Accent DR pacemaker 3. Labile hypertension - controlled 4. Chronic anticoagulation on Eliquis  PLAN: 1.   Mr. Puleo is doing well. His energy level is good and his pacemaker seems to be working properly. He is going to have a remote check next week. He is tolerating his new anticoagulation without any bleeding difficulty. His blood pressure is well-controlled. He denies any chest pain or worsening shortness of breath. We'll continue his current medications and I'll plan to see him back in 6 months.    Pixie Casino, MD, [facility] Attending Cardiologist [place] 06/13/2013, 1:37 PM

## 2013-06-13 NOTE — Patient Instructions (Signed)
Your physician wants you to follow-up in:  6 months. You will receive a reminder letter in the mail two months in advance. If you don't receive a letter, please call our office to schedule the follow-up appointment.   

## 2013-06-18 ENCOUNTER — Ambulatory Visit (INDEPENDENT_AMBULATORY_CARE_PROVIDER_SITE_OTHER): Payer: Medicare Other | Admitting: *Deleted

## 2013-06-18 ENCOUNTER — Encounter: Payer: Self-pay | Admitting: Cardiovascular Disease

## 2013-06-18 DIAGNOSIS — I495 Sick sinus syndrome: Secondary | ICD-10-CM

## 2013-06-18 LAB — MDC_IDC_ENUM_SESS_TYPE_REMOTE
Battery Remaining Longevity: 84 mo
Battery Voltage: 2.95 V
Brady Statistic AP VP Percent: 2.4 %
Brady Statistic AP VS Percent: 93 %
Brady Statistic AS VP Percent: 1 %
Brady Statistic RA Percent Paced: 88 %
Brady Statistic RV Percent Paced: 4.3 %
Date Time Interrogation Session: 20150505091428
Implantable Pulse Generator Model: 2210
Implantable Pulse Generator Serial Number: 7353349
Lead Channel Impedance Value: 430 Ohm
Lead Channel Pacing Threshold Amplitude: 0.875 V
Lead Channel Pacing Threshold Pulse Width: 0.4 ms
Lead Channel Pacing Threshold Pulse Width: 0.4 ms
Lead Channel Sensing Intrinsic Amplitude: 4.3 mV
Lead Channel Setting Pacing Amplitude: 2.5 V
MDC IDC MSMT LEADCHNL RV IMPEDANCE VALUE: 450 Ohm
MDC IDC MSMT LEADCHNL RV PACING THRESHOLD AMPLITUDE: 1 V
MDC IDC MSMT LEADCHNL RV SENSING INTR AMPL: 12 mV
MDC IDC SET LEADCHNL RA PACING AMPLITUDE: 1.875
MDC IDC SET LEADCHNL RV PACING PULSEWIDTH: 0.4 ms
MDC IDC SET LEADCHNL RV SENSING SENSITIVITY: 2 mV
MDC IDC STAT BRADY AS VS PERCENT: 4.1 %

## 2013-06-27 ENCOUNTER — Encounter: Payer: Self-pay | Admitting: Cardiology

## 2013-06-28 NOTE — Progress Notes (Signed)
Remote pacemaker transmission.   

## 2013-09-19 ENCOUNTER — Ambulatory Visit (INDEPENDENT_AMBULATORY_CARE_PROVIDER_SITE_OTHER): Payer: Medicare Other | Admitting: *Deleted

## 2013-09-19 DIAGNOSIS — I495 Sick sinus syndrome: Secondary | ICD-10-CM

## 2013-09-19 LAB — MDC_IDC_ENUM_SESS_TYPE_REMOTE
Battery Remaining Longevity: 84 mo
Battery Remaining Percentage: 74 %
Brady Statistic AP VP Percent: 2.9 %
Brady Statistic AS VS Percent: 3.8 %
Brady Statistic RA Percent Paced: 88 %
Brady Statistic RV Percent Paced: 5.4 %
Implantable Pulse Generator Model: 2210
Implantable Pulse Generator Serial Number: 7353349
Lead Channel Impedance Value: 430 Ohm
Lead Channel Impedance Value: 440 Ohm
Lead Channel Pacing Threshold Amplitude: 1 V
Lead Channel Pacing Threshold Amplitude: 1.125 V
Lead Channel Pacing Threshold Pulse Width: 0.4 ms
Lead Channel Setting Pacing Amplitude: 2.125
Lead Channel Setting Pacing Pulse Width: 0.4 ms
Lead Channel Setting Sensing Sensitivity: 2 mV
MDC IDC MSMT BATTERY VOLTAGE: 2.95 V
MDC IDC MSMT LEADCHNL RA SENSING INTR AMPL: 2.6 mV
MDC IDC MSMT LEADCHNL RV PACING THRESHOLD PULSEWIDTH: 0.4 ms
MDC IDC MSMT LEADCHNL RV SENSING INTR AMPL: 12 mV
MDC IDC SESS DTM: 20150806074819
MDC IDC SET LEADCHNL RV PACING AMPLITUDE: 2.5 V
MDC IDC STAT BRADY AP VS PERCENT: 93 %
MDC IDC STAT BRADY AS VP PERCENT: 1 %

## 2013-09-19 NOTE — Progress Notes (Signed)
Remote pacemaker transmission.   

## 2013-09-25 ENCOUNTER — Encounter: Payer: Self-pay | Admitting: Cardiology

## 2013-10-02 ENCOUNTER — Encounter: Payer: Self-pay | Admitting: Cardiovascular Disease

## 2013-12-12 ENCOUNTER — Ambulatory Visit (INDEPENDENT_AMBULATORY_CARE_PROVIDER_SITE_OTHER): Payer: Medicare Other | Admitting: Internal Medicine

## 2013-12-12 ENCOUNTER — Encounter: Payer: Self-pay | Admitting: Internal Medicine

## 2013-12-12 VITALS — BP 140/78 | HR 71 | Ht 69.0 in | Wt 166.6 lb

## 2013-12-12 DIAGNOSIS — R0989 Other specified symptoms and signs involving the circulatory and respiratory systems: Secondary | ICD-10-CM

## 2013-12-12 DIAGNOSIS — Z7901 Long term (current) use of anticoagulants: Secondary | ICD-10-CM

## 2013-12-12 DIAGNOSIS — I455 Other specified heart block: Secondary | ICD-10-CM

## 2013-12-12 DIAGNOSIS — I10 Essential (primary) hypertension: Secondary | ICD-10-CM

## 2013-12-12 DIAGNOSIS — I480 Paroxysmal atrial fibrillation: Secondary | ICD-10-CM

## 2013-12-12 DIAGNOSIS — Z950 Presence of cardiac pacemaker: Secondary | ICD-10-CM

## 2013-12-12 NOTE — Patient Instructions (Signed)
Your physician wants you to follow-up in: 1 Year. You will receive a reminder letter in the mail two months in advance. If you don't receive a letter, please call our office to schedule the follow-up appointment.  

## 2013-12-12 NOTE — Progress Notes (Signed)
OFFICE NOTE  Chief Complaint:  Routine follow-up  Primary Care Physician: Delphina Cahill, MD  HPI:  Cody George is an 80 year old gentleman who has a history of atrial fibrillation and tachy-brady syndrome as well as paroxysmal atrial fibrillation and sinus node arrest. He underwent a St. Jude Accent DR dual-chamber pacemaker in June of 2013. He has done fairly well other than he had some hypotension on lisinopril which was discontinued. He has recently had some tachyarrhythmias and had increased his metoprolol succinate to 25 mg daily due to tachyarrhythmias. He is enrolled in a home monitoring service with the [facility] patient care network. He was successfully transitioned over to Eliquis she is taking without any bleeding difficulty. He denies any shortness of breath or worsening chest pain. He is due for a remote pacer check in a few days.  Cody George returns for follow-up today. He is feeling well denies any chest pain or worsening shortness of breath. He has no palpitations. Blood pressure is well controlled. He had recent bilateral cataract surgery and is seeing much better. There are no bleeding issues.  PMHx:  Past Medical History  Diagnosis Date  . Hypertension   . Dysrhythmia     PAF  . GERD (gastroesophageal reflux disease)   . Prostate cancer     radiation  . Arthritis   . Cellulitis   . Pacemaker 07/22/2011    St. 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 OA (osteoarthritis)   . History of nuclear stress test 10/05/2010    dipyridamole; normal pattern of perfusion in all regions; no significant ishcemia, low risk scan   . Paroxysmal atrial fibrillation     Past Surgical History  Procedure Laterality Date  . Hernia repair    . Pacemaker insertion  07/22/2011    St. 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 Transthoracic echocardiogram  10/05/2010    EF=50-55%, normal LV systolic  function; LA & RA mildly dilated; mild MR; mild TR with RV systolic pressure elevted at 30-30mmHg; AV mildly sclerotic with mild calcif of AV leaflets, mild valvular AS, mild-mod regurg; trace pulm valve regurg  . US echocardiography  10/05/10    LA mildly dilated, mild TR, mild ca+ of AOV ,mild to mod. AI  . Nm myocar perf wall motion  10/05/10    normal    FAMHx:  History reviewed. No pertinent family history.  SOCHx:   reports that he has been smoking.  He has never used smokeless tobacco. He reports that he does not drink alcohol or use illicit drugs.  ALLERGIES:  No Known Allergies  ROS: A comprehensive review of systems was negative.  HOME MEDS: Current Outpatient Prescriptions  Medication Sig Dispense Refill  . apixaban (ELIQUIS) 5 MG TABS tablet Take 1 tablet (5 mg total) by mouth 2 (two) times daily.  56 tablet  0  . BESIVANCE 0.6 % SUSP Place 1 drop into the left eye 3 (three) times daily.      . DUREZOL 0.05 % EMUL Place 1 drop into the left eye 3 (three) times daily.      . ferrous sulfate 325 (65 FE) MG tablet Take 325 mg by mouth daily with breakfast.      . hydrochlorothiazide (HYDRODIURIL) 25 MG tablet Take 1 tablet by mouth daily.      . ILEVRO 0.3 % SUSP Place 1 drop into both eyes daily.       Marland Kitchen  metoprolol succinate (TOPROL-XL) 25 MG 24 hr tablet TAKE ONE (1) TABLET BY MOUTH EVERY DAY  30 tablet  8  . OVER THE COUNTER MEDICATION Take 4-5 oz by mouth daily. Cherry Juice      . pantoprazole (PROTONIX) 40 MG tablet Take 1 tablet by mouth daily.      . vitamin B-12 (CYANOCOBALAMIN) 1000 MCG tablet Take 1,000 mcg by mouth daily.      Marland Kitchen VITAMIN C, CALCIUM ASCORBATE, PO Take 1,000 mg by mouth daily.       Marland Kitchen VITAMIN D, CHOLECALCIFEROL, PO Take 1,000 mg by mouth daily.        No current facility-administered medications for this visit.    LABS/IMAGING: No results found for this or any previous visit (from the past 48 hour(s)). No results found.  VITALS: BP 140/78   Pulse 71  Ht 5\' 9"  (1.753 m)  Wt 166 lb 9.6 oz (75.569 kg)  BMI 24.59 kg/m2  EXAM: General appearance: alert and no distress Neck: no carotid bruit and no JVD Lungs: clear to auscultation bilaterally Heart: regular rate and rhythm, S1, S2 normal, no murmur, click, rub or gallop Abdomen: soft, non-tender; bowel sounds normal; no masses,  no organomegaly Extremities: extremities normal, atraumatic, no cyanosis or edema Pulses: 2+ and symmetric Skin: Skin color, texture, turgor normal. No rashes or lesions Neurologic: Grossly normal Psych: Mood, affect normal, pleasant  EKG: Atrial paced rhythm at 71  ASSESSMENT: 1. Paroxysmal atrial fibrillation with sinus node arrest 2. Status post St. Jude Accent DR pacemaker 3. Labile hypertension - controlled 4. Chronic anticoagulation on Eliquis  PLAN: 1.   Cody George is doing well. Blood pressure is well-controlled. There is no evidence of recurrent atrial fibrillation. He is due for an annual pacer check with Dr. Sallyanne Kuster. He is having no bleeding issues on eloquence. We will continue current medications and plan to see him back annually or sooner sooner as necessary.   Pixie Casino, MD, [facility] Attending Cardiologist [facility]  HILTY,Kenneth C 12/12/2013, 12:48 PM

## 2013-12-25 ENCOUNTER — Other Ambulatory Visit: Payer: Self-pay | Admitting: Internal Medicine

## 2014-01-23 ENCOUNTER — Encounter (HOSPITAL_COMMUNITY): Payer: Self-pay | Admitting: Cardiovascular Disease

## 2014-02-09 ENCOUNTER — Other Ambulatory Visit: Payer: Self-pay | Admitting: Internal Medicine

## 2014-02-10 NOTE — Telephone Encounter (Signed)
Rx refill sent to patient pharmacy   

## 2014-02-18 ENCOUNTER — Encounter: Payer: Self-pay | Admitting: Cardiovascular Disease

## 2014-02-18 ENCOUNTER — Ambulatory Visit (INDEPENDENT_AMBULATORY_CARE_PROVIDER_SITE_OTHER): Payer: 59 | Admitting: Cardiovascular Disease

## 2014-02-18 VITALS — BP 146/86 | HR 64 | Resp 16 | Ht 69.0 in | Wt 167.9 lb

## 2014-02-18 DIAGNOSIS — I480 Paroxysmal atrial fibrillation: Secondary | ICD-10-CM

## 2014-02-18 DIAGNOSIS — C61 Malignant neoplasm of prostate: Secondary | ICD-10-CM | POA: Diagnosis not present

## 2014-02-18 DIAGNOSIS — I455 Other specified heart block: Secondary | ICD-10-CM

## 2014-02-18 DIAGNOSIS — Z7901 Long term (current) use of anticoagulants: Secondary | ICD-10-CM | POA: Diagnosis not present

## 2014-02-18 DIAGNOSIS — Z950 Presence of cardiac pacemaker: Secondary | ICD-10-CM | POA: Diagnosis not present

## 2014-02-18 LAB — MDC_IDC_ENUM_SESS_TYPE_INCLINIC
Brady Statistic RA Percent Paced: 89 %
Brady Statistic RV Percent Paced: 4.7 %
Implantable Pulse Generator Model: 2210
Implantable Pulse Generator Serial Number: 7353349
Lead Channel Impedance Value: 450 Ohm
Lead Channel Pacing Threshold Amplitude: 1 V
Lead Channel Pacing Threshold Amplitude: 1.25 V
Lead Channel Pacing Threshold Amplitude: 1.25 V
Lead Channel Pacing Threshold Pulse Width: 0.4 ms
Lead Channel Pacing Threshold Pulse Width: 0.4 ms
Lead Channel Pacing Threshold Pulse Width: 0.4 ms
Lead Channel Sensing Intrinsic Amplitude: 12 mV
Lead Channel Sensing Intrinsic Amplitude: 4.3 mV
Lead Channel Setting Pacing Amplitude: 2 V
Lead Channel Setting Pacing Pulse Width: 0.4 ms
Lead Channel Setting Sensing Sensitivity: 2 mV
MDC IDC MSMT BATTERY REMAINING LONGEVITY: 105.6 mo
MDC IDC MSMT BATTERY VOLTAGE: 2.95 V
MDC IDC MSMT LEADCHNL RV IMPEDANCE VALUE: 550 Ohm
MDC IDC SESS DTM: 20160105110240
MDC IDC SET LEADCHNL RV PACING AMPLITUDE: 2.5 V

## 2014-02-18 NOTE — Patient Instructions (Addendum)
Remote monitoring is used to monitor your pacemaker from home. This monitoring reduces the number of office visits required to check your device to one time per year. It allows [place] to keep an eye on the functioning of your device to ensure it is working properly. You are scheduled for a device check from home on 05-20-2014. You may send your transmission at any time that day. If you have a wireless device, the transmission will be sent automatically. After your physician reviews your transmission, you will receive a postcard with your next transmission date.  Your physician recommends that you schedule a follow-up appointment in: 12 months with Dr.Croitoru  Follow up with Dr.Hilty as scheduled.

## 2014-02-20 ENCOUNTER — Encounter: Payer: Self-pay | Admitting: Cardiovascular Disease

## 2014-02-20 ENCOUNTER — Telehealth: Payer: Self-pay | Admitting: Pharmacist Clinician (PhC)/ Clinical Pharmacy Specialist

## 2014-02-20 NOTE — Telephone Encounter (Signed)
-----   Message from Pixie Casino, MD sent at 02/20/2014  1:42 PM EST ----- [name] spoke with [name] during a device check and he stated that he often forgets the evening dose of Eliquis - can you help switch him to [place] or [place]?  Dr. Lemmie Evens  ----- Message -----    From: Sanda Klein, MD    Sent: 02/20/2014   8:47 AM      To: Pixie Casino, MD  [name], he has some trouble remembering to take the evening dose of Eliquis. I told him I would ask you if you think a once daily agent is preferable.

## 2014-02-20 NOTE — Progress Notes (Signed)
Patient ID: Cody George, male   DOB: 03/23/1930, 81 y.o.   MRN: [id number]      Reason for office visit Pacemaker follow-up  Cody George is an 81 year old gentleman who has a history of atrial fibrillation and tachy-brady syndrome as well as paroxysmal atrial fibrillation and sinus node arrest. He underwent a St. Jude Accent DR dual-chamber pacemaker in June of 2013. He has recently had some tachyarrhythmias and had increased his metoprolol succinate to 25 mg daily. He is enrolled in a home monitoring service with the [facility] patient care network. He was successfully transitioned over to Eliquis she is taking without any bleeding difficulty. He often has difficulty remembering to take the evening dose of Eliquis and has missed 6 doses this month.   Device check shows normal function. Generator longevity >8 years, 89% A paced, 5% V paced. AFib burden around 11%, comparable to historical trend, good V rate control, always asymptomatic. Episodes as long as 24 hours have been recorded.  No Known Allergies  Current Outpatient Prescriptions  Medication Sig Dispense Refill  . ELIQUIS 5 MG TABS tablet TAKE ONE TABLET BY MOUTH TWICE A DAY 60 tablet 5  . ferrous sulfate 325 (65 FE) MG tablet Take 325 mg by mouth daily with breakfast.    . hydrochlorothiazide (HYDRODIURIL) 25 MG tablet Take 1 tablet by mouth daily.    . metoprolol succinate (TOPROL-XL) 25 MG 24 hr tablet TAKE ONE (1) TABLET BY MOUTH EVERY DAY 30 tablet 9  . OVER THE COUNTER MEDICATION Take 4-5 oz by mouth daily. Cherry Juice    . pantoprazole (PROTONIX) 40 MG tablet Take 1 tablet by mouth daily.    . vitamin B-12 (CYANOCOBALAMIN) 1000 MCG tablet Take 1,000 mcg by mouth daily.    Marland Kitchen VITAMIN C, CALCIUM ASCORBATE, PO Take 1,000 mg by mouth daily.     Marland Kitchen VITAMIN D, CHOLECALCIFEROL, PO Take 1,000 mg by mouth daily.      No current facility-administered medications for this visit.    Past Medical History  Diagnosis Date  .  Hypertension   . Dysrhythmia     PAF  . GERD (gastroesophageal reflux disease)   . Prostate cancer     radiation  . Arthritis   . Cellulitis   . Pacemaker 07/22/2011    St. 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 OA (osteoarthritis)   . History of nuclear stress test 10/05/2010    dipyridamole; normal pattern of perfusion in all regions; no significant ishcemia, low risk scan   . Paroxysmal atrial fibrillation     Past Surgical History  Procedure Laterality Date  . Hernia repair    . Pacemaker insertion  07/22/2011    St. 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 Transthoracic echocardiogram  10/05/2010    EF=50-55%, normal LV systolic function; LA & RA mildly dilated; mild MR; mild TR with RV systolic pressure elevted at 30-39mmHg; AV mildly sclerotic with mild calcif of AV leaflets, mild valvular AS, mild-mod regurg; trace pulm valve regurg  . US echocardiography  10/05/10    LA mildly dilated, mild TR, mild ca+ of AOV ,mild to mod. AI  . Nm myocar perf wall motion  10/05/10    normal  . Permanent pacemaker insertion N/A 07/22/2011    Procedure: PERMANENT PACEMAKER INSERTION;  Surgeon: Sanda Klein, MD;  Location: [place] CATH LAB;  Service: Cardiovascular;  Laterality: N/A;  No family history on file.  History   Social History  . Marital Status: Married    Spouse Name: N/A    Number of Children: 79  . Years of Education: N/A   Occupational History  .     Social History Main Topics  . Smoking status: Current Every Day Smoker -- 1.00 packs/day for 65 years  . Smokeless tobacco: Never Used     Comment: now 3/4 ppd  . Alcohol Use: No  . Drug Use: No  . Sexual Activity: Not Currently   Other Topics Concern  . Not on file   Social History Narrative    Review of systems: The patient specifically denies any chest pain at rest or with exertion, dyspnea at rest or with exertion, orthopnea,  paroxysmal nocturnal dyspnea, syncope, palpitations, focal neurological deficits, intermittent claudication, lower extremity edema, unexplained weight gain, cough, hemoptysis or wheezing.  The patient also denies abdominal pain, nausea, vomiting, dysphagia, diarrhea, constipation, polyuria, polydipsia, dysuria, hematuria, frequency, urgency, abnormal bleeding or bruising, fever, chills, unexpected weight changes, mood swings, change in skin or hair texture, change in voice quality, auditory or visual problems, allergic reactions or rashes, new musculoskeletal complaints other than usual "aches and pains".   PHYSICAL EXAM BP 146/86 mmHg  Pulse 64  Resp 16  Ht 5\' 9"  (1.753 m)  Wt 167 lb 14.4 oz (76.159 kg)  BMI 24.78 kg/m2  General: Alert, oriented x3, no distress Head: no evidence of trauma, PERRL, EOMI, no exophtalmos or lid lag, no myxedema, no xanthelasma; normal ears, nose and oropharynx Neck: normal jugular venous pulsations and no hepatojugular reflux; brisk carotid pulses without delay and no carotid bruits Chest: clear to auscultation, no signs of consolidation by percussion or palpation, normal fremitus, symmetrical and full respiratory excursions, healthy subclavian device site Cardiovascular: normal position and quality of the apical impulse, regular rhythm, normal first and second heart sounds, no murmurs, rubs or gallops Abdomen: no tenderness or distention, no masses by palpation, no abnormal pulsatility or arterial bruits, normal bowel sounds, no hepatosplenomegaly Extremities: no clubbing, cyanosis or edema; 2+ radial, ulnar and brachial pulses bilaterally; 2+ right femoral, posterior tibial and dorsalis pedis pulses; 2+ left femoral, posterior tibial and dorsalis pedis pulses; no subclavian or femoral bruits Neurological: grossly nonfocal   EKG: A paced V sensed  Lipid Panel     Component Value Date/Time   CHOL 131 12/21/2012 0820   TRIG 68 12/21/2012 0820   HDL 53  12/21/2012 0820   CHOLHDL 2.5 12/21/2012 0820   VLDL 14 12/21/2012 0820   LDLCALC 64 12/21/2012 0820    BMET    Component Value Date/Time   NA 136 12/21/2012 0820   K 5.8* 12/21/2012 0820   CL 100 12/21/2012 0820   CO2 29 12/21/2012 0820   GLUCOSE 113* 12/21/2012 0820   BUN 18 12/21/2012 0820   CREATININE 1.08 12/21/2012 0820   CALCIUM 9.5 12/21/2012 0820     ASSESSMENT AND PLAN  Normal dual chamber pacemaker function  Recurrent PAF, asymptomatic, well-rate-controlled Consider switching to a once daily anticoagulant due to his memory problems.  HTN Previous problems with labile BP, no adjustment made for today's borderline high BP.  Orders Placed This Encounter  Procedures  . Implantable device check   No orders of the defined types were placed in this encounter.    Holli Humbles, MD, [place] [phone] office [phone] pager

## 2014-02-20 NOTE — Telephone Encounter (Signed)
Spoke with pt wife. She isn't sure how much Eliquis pt has left, he will call tomorrow.  Only concern with Xarelto is that pt apparently takes all meds in AM, not sure if a dinner dose would be any more compliant.  Will speak with patient when he returns call

## 2014-02-21 NOTE — Telephone Encounter (Signed)
Spoke with patient, he states that he is trying a different method to remember evening dose.  Puts med bottle on dinner table at his place after taking morning dose.  Would like to try for another 3-4 weeks to see if he dose better before considering switching meds to something that may cost more or not be covered on insurance.  Gave him my contact information if he has any concerns

## 2014-02-26 DIAGNOSIS — H6121 Impacted cerumen, right ear: Secondary | ICD-10-CM | POA: Diagnosis not present

## 2014-03-04 ENCOUNTER — Encounter: Payer: Self-pay | Admitting: Cardiovascular Disease

## 2014-04-04 DIAGNOSIS — R351 Nocturia: Secondary | ICD-10-CM | POA: Diagnosis not present

## 2014-04-07 ENCOUNTER — Ambulatory Visit (HOSPITAL_COMMUNITY)
Admission: RE | Admit: 2014-04-07 | Discharge: 2014-04-07 | Disposition: A | Discharge status: Home / Self Care | Payer: 59 | Source: Ambulatory Visit | Attending: Internal Medicine | Admitting: Internal Medicine

## 2014-04-07 ENCOUNTER — Other Ambulatory Visit (HOSPITAL_COMMUNITY): Payer: Self-pay | Admitting: Internal Medicine

## 2014-04-07 DIAGNOSIS — R0602 Shortness of breath: Secondary | ICD-10-CM | POA: Diagnosis not present

## 2014-04-07 DIAGNOSIS — R091 Pleurisy: Secondary | ICD-10-CM | POA: Diagnosis not present

## 2014-04-07 DIAGNOSIS — R05 Cough: Secondary | ICD-10-CM | POA: Diagnosis not present

## 2014-04-07 DIAGNOSIS — H6121 Impacted cerumen, right ear: Secondary | ICD-10-CM | POA: Diagnosis not present

## 2014-04-07 DIAGNOSIS — R079 Chest pain, unspecified: Secondary | ICD-10-CM | POA: Diagnosis not present

## 2014-04-07 DIAGNOSIS — R918 Other nonspecific abnormal finding of lung field: Secondary | ICD-10-CM | POA: Insufficient documentation

## 2014-04-07 DIAGNOSIS — Z950 Presence of cardiac pacemaker: Secondary | ICD-10-CM | POA: Diagnosis not present

## 2014-04-08 ENCOUNTER — Ambulatory Visit (INDEPENDENT_AMBULATORY_CARE_PROVIDER_SITE_OTHER): Payer: 59 | Admitting: Urology

## 2014-04-08 DIAGNOSIS — C61 Malignant neoplasm of prostate: Secondary | ICD-10-CM | POA: Diagnosis not present

## 2014-04-08 DIAGNOSIS — R312 Other microscopic hematuria: Secondary | ICD-10-CM

## 2014-04-08 DIAGNOSIS — R351 Nocturia: Secondary | ICD-10-CM | POA: Diagnosis not present

## 2014-04-14 ENCOUNTER — Encounter (HOSPITAL_COMMUNITY): Payer: Self-pay | Admitting: *Deleted

## 2014-04-14 ENCOUNTER — Inpatient Hospital Stay (HOSPITAL_COMMUNITY)
Admission: EM | Admit: 2014-04-14 | Discharge: 2014-04-15 | DRG: 186 | Disposition: A | Discharge status: Home / Self Care | Payer: Medicare Other | Attending: Internal Medicine | Admitting: Internal Medicine

## 2014-04-14 ENCOUNTER — Inpatient Hospital Stay (HOSPITAL_COMMUNITY): Payer: Medicare Other

## 2014-04-14 ENCOUNTER — Emergency Department (HOSPITAL_COMMUNITY): Payer: Medicare Other

## 2014-04-14 DIAGNOSIS — J189 Pneumonia, unspecified organism: Secondary | ICD-10-CM

## 2014-04-14 DIAGNOSIS — R05 Cough: Secondary | ICD-10-CM | POA: Diagnosis present

## 2014-04-14 DIAGNOSIS — I10 Essential (primary) hypertension: Secondary | ICD-10-CM | POA: Diagnosis not present

## 2014-04-14 DIAGNOSIS — F17200 Nicotine dependence, unspecified, uncomplicated: Secondary | ICD-10-CM | POA: Diagnosis not present

## 2014-04-14 DIAGNOSIS — J90 Pleural effusion, not elsewhere classified: Secondary | ICD-10-CM | POA: Diagnosis not present

## 2014-04-14 DIAGNOSIS — Z923 Personal history of irradiation: Secondary | ICD-10-CM

## 2014-04-14 DIAGNOSIS — Z950 Presence of cardiac pacemaker: Secondary | ICD-10-CM | POA: Diagnosis not present

## 2014-04-14 DIAGNOSIS — Z8546 Personal history of malignant neoplasm of prostate: Secondary | ICD-10-CM

## 2014-04-14 DIAGNOSIS — K219 Gastro-esophageal reflux disease without esophagitis: Secondary | ICD-10-CM | POA: Diagnosis present

## 2014-04-14 DIAGNOSIS — Z7901 Long term (current) use of anticoagulants: Secondary | ICD-10-CM

## 2014-04-14 DIAGNOSIS — J069 Acute upper respiratory infection, unspecified: Secondary | ICD-10-CM | POA: Diagnosis not present

## 2014-04-14 DIAGNOSIS — C61 Malignant neoplasm of prostate: Secondary | ICD-10-CM

## 2014-04-14 DIAGNOSIS — R091 Pleurisy: Secondary | ICD-10-CM | POA: Diagnosis not present

## 2014-04-14 DIAGNOSIS — E876 Hypokalemia: Secondary | ICD-10-CM

## 2014-04-14 DIAGNOSIS — R000 Tachycardia, unspecified: Secondary | ICD-10-CM | POA: Diagnosis not present

## 2014-04-14 DIAGNOSIS — M1990 Unspecified osteoarthritis, unspecified site: Secondary | ICD-10-CM | POA: Diagnosis present

## 2014-04-14 DIAGNOSIS — I480 Paroxysmal atrial fibrillation: Secondary | ICD-10-CM | POA: Diagnosis not present

## 2014-04-14 DIAGNOSIS — I4891 Unspecified atrial fibrillation: Secondary | ICD-10-CM | POA: Diagnosis not present

## 2014-04-14 DIAGNOSIS — Z9889 Other specified postprocedural states: Secondary | ICD-10-CM

## 2014-04-14 DIAGNOSIS — Z6824 Body mass index (BMI) 24.0-24.9, adult: Secondary | ICD-10-CM | POA: Diagnosis not present

## 2014-04-14 DIAGNOSIS — I959 Hypotension, unspecified: Secondary | ICD-10-CM | POA: Diagnosis not present

## 2014-04-14 DIAGNOSIS — J9811 Atelectasis: Secondary | ICD-10-CM | POA: Diagnosis not present

## 2014-04-14 HISTORY — DX: Pneumonia, unspecified organism: J18.9

## 2014-04-14 LAB — CBC WITH DIFFERENTIAL/PLATELET
BASOS ABS: 0 10*3/uL (ref 0.0–0.1)
Basophils Relative: 0 % (ref 0–1)
EOS ABS: 0.1 10*3/uL (ref 0.0–0.7)
EOS PCT: 1 % (ref 0–5)
HCT: 36 % — ABNORMAL LOW (ref 39.0–52.0)
Hemoglobin: 12.6 g/dL — ABNORMAL LOW (ref 13.0–17.0)
Lymphocytes Relative: 5 % — ABNORMAL LOW (ref 12–46)
Lymphs Abs: 0.7 10*3/uL (ref 0.7–4.0)
MCH: 31 pg (ref 26.0–34.0)
MCHC: 35 g/dL (ref 30.0–36.0)
MCV: 88.7 fL (ref 78.0–100.0)
Monocytes Absolute: 1.8 10*3/uL — ABNORMAL HIGH (ref 0.1–1.0)
Monocytes Relative: 11 % (ref 3–12)
Neutro Abs: 13.7 10*3/uL — ABNORMAL HIGH (ref 1.7–7.7)
Neutrophils Relative %: 84 % — ABNORMAL HIGH (ref 43–77)
RBC: 4.06 MIL/uL — ABNORMAL LOW (ref 4.22–5.81)
RDW: 13.2 % (ref 11.5–15.5)
WBC: 16.4 10*3/uL — ABNORMAL HIGH (ref 4.0–10.5)

## 2014-04-14 LAB — I-STAT CG4 LACTIC ACID, ED: Lactic Acid, Venous: 1.33 mmol/L (ref 0.5–2.0)

## 2014-04-14 LAB — BASIC METABOLIC PANEL
Anion gap: 5 (ref 5–15)
BUN: 18 mg/dL (ref 6–23)
CALCIUM: 8.6 mg/dL (ref 8.4–10.5)
CO2: 28 mmol/L (ref 19–32)
CREATININE: 0.67 mg/dL (ref 0.50–1.35)
Chloride: 94 mmol/L — ABNORMAL LOW (ref 96–112)
GFR calc Af Amer: >90 mL/min (ref >90)
GFR calc non Af Amer: 88 mL/min — ABNORMAL LOW (ref >90)
GLUCOSE: 139 mg/dL — AB (ref 70–99)
Potassium: 3.3 mmol/L — ABNORMAL LOW (ref 3.5–5.1)
SODIUM: 127 mmol/L — AB (ref 135–145)

## 2014-04-14 LAB — HEPATIC FUNCTION PANEL
ALT: 48 U/L (ref 0–53)
AST: 42 U/L — ABNORMAL HIGH (ref 0–37)
Albumin: 2.4 g/dL — ABNORMAL LOW (ref 3.5–5.2)
Alkaline Phosphatase: 83 U/L (ref 39–117)
BILIRUBIN DIRECT: 0.2 mg/dL (ref 0.0–0.5)
BILIRUBIN INDIRECT: 0.4 mg/dL (ref 0.3–0.9)
BILIRUBIN TOTAL: 0.6 mg/dL (ref 0.3–1.2)
Total Protein: 6 g/dL (ref 6.0–8.3)

## 2014-04-14 LAB — URINALYSIS, ROUTINE W REFLEX MICROSCOPIC
Bilirubin Urine: NEGATIVE
Glucose, UA: NEGATIVE mg/dL
Hgb urine dipstick: NEGATIVE
Ketones, ur: NEGATIVE mg/dL
LEUKOCYTES UA: NEGATIVE
NITRITE: NEGATIVE
Protein, ur: NEGATIVE mg/dL
SPECIFIC GRAVITY, URINE: 1.01 (ref 1.005–1.030)
UROBILINOGEN UA: 1 mg/dL (ref 0.0–1.0)
pH: 7 (ref 5.0–8.0)

## 2014-04-14 LAB — TROPONIN I

## 2014-04-14 MED ORDER — SODIUM CHLORIDE 0.9 % IV SOLN
INTRAVENOUS | Status: DC
  Administered 2014-04-14: 18:00:00 via INTRAVENOUS

## 2014-04-14 MED ORDER — SODIUM CHLORIDE 0.9 % IV SOLN: INTRAVENOUS | Status: DC

## 2014-04-14 MED ORDER — DEXTROSE 5 % IV SOLN
1.0000 g | INTRAVENOUS | Status: DC
  Filled 2014-04-14: qty 10

## 2014-04-14 MED ORDER — SODIUM CHLORIDE 0.9 % IV BOLUS (SEPSIS)
500.0000 mL | Freq: Once | INTRAVENOUS | Status: AC
  Administered 2014-04-14: 500 mL via INTRAVENOUS

## 2014-04-14 MED ORDER — HYDROCHLOROTHIAZIDE 25 MG PO TABS
25.0000 mg | ORAL_TABLET | Freq: Every day | ORAL | Status: DC
  Administered 2014-04-15: 25 mg via ORAL
  Filled 2014-04-14: qty 1

## 2014-04-14 MED ORDER — SODIUM CHLORIDE 0.9 % IV BOLUS (SEPSIS): 250.0000 mL | Freq: Once | INTRAVENOUS | Status: DC

## 2014-04-14 MED ORDER — FERROUS SULFATE 325 (65 FE) MG PO TABS
325.0000 mg | ORAL_TABLET | Freq: Every day | ORAL | Status: DC
  Administered 2014-04-15: 325 mg via ORAL
  Filled 2014-04-14: qty 1

## 2014-04-14 MED ORDER — DEXTROSE 5 % IV SOLN
500.0000 mg | INTRAVENOUS | Status: DC
  Administered 2014-04-14: 500 mg via INTRAVENOUS
  Filled 2014-04-14: qty 500

## 2014-04-14 MED ORDER — DILTIAZEM HCL 25 MG/5ML IV SOLN
10.0000 mg | Freq: Once | INTRAVENOUS | Status: AC
  Administered 2014-04-14: 10 mg via INTRAVENOUS
  Filled 2014-04-14: qty 5

## 2014-04-14 MED ORDER — PANTOPRAZOLE SODIUM 40 MG PO TBEC
40.0000 mg | DELAYED_RELEASE_TABLET | Freq: Every day | ORAL | Status: DC
  Administered 2014-04-14 – 2014-04-15 (×2): 40 mg via ORAL
  Filled 2014-04-14 (×2): qty 1

## 2014-04-14 MED ORDER — VITAMIN B-12 1000 MCG PO TABS: 1000.0000 ug | ORAL_TABLET | Freq: Every day | ORAL | Status: DC

## 2014-04-14 MED ORDER — DEXTROSE 5 % IV SOLN
500.0000 mg | INTRAVENOUS | Status: DC
  Filled 2014-04-14: qty 500

## 2014-04-14 MED ORDER — POTASSIUM CHLORIDE 10 MEQ/100ML IV SOLN
10.0000 meq | Freq: Once | INTRAVENOUS | Status: AC
  Administered 2014-04-14: 10 meq via INTRAVENOUS
  Filled 2014-04-14: qty 100

## 2014-04-14 MED ORDER — APIXABAN 5 MG PO TABS
5.0000 mg | ORAL_TABLET | Freq: Two times a day (BID) | ORAL | Status: DC
  Administered 2014-04-14 – 2014-04-15 (×2): 5 mg via ORAL
  Filled 2014-04-14 (×2): qty 1

## 2014-04-14 MED ORDER — DEXTROSE 5 % IV SOLN
1.0000 g | Freq: Once | INTRAVENOUS | Status: AC
  Administered 2014-04-14: 1 g via INTRAVENOUS
  Filled 2014-04-14: qty 10

## 2014-04-14 MED ORDER — LORAZEPAM 0.5 MG PO TABS
0.5000 mg | ORAL_TABLET | Freq: Four times a day (QID) | ORAL | Status: DC | PRN
  Administered 2014-04-15: 0.5 mg via ORAL
  Filled 2014-04-14: qty 1

## 2014-04-14 MED ORDER — IOHEXOL 300 MG/ML  SOLN
80.0000 mL | Freq: Once | INTRAMUSCULAR | Status: AC | PRN
  Administered 2014-04-14: 80 mL via INTRAVENOUS

## 2014-04-14 MED ORDER — SODIUM CHLORIDE 0.9 % IV SOLN
INTRAVENOUS | Status: DC
  Administered 2014-04-14 – 2014-04-15 (×2): via INTRAVENOUS

## 2014-04-14 MED ORDER — METOPROLOL SUCCINATE ER 25 MG PO TB24
25.0000 mg | ORAL_TABLET | Freq: Every day | ORAL | Status: DC
  Administered 2014-04-15: 25 mg via ORAL
  Filled 2014-04-14: qty 1

## 2014-04-14 MED ORDER — ZOLPIDEM TARTRATE 5 MG PO TABS
5.0000 mg | ORAL_TABLET | Freq: Every evening | ORAL | Status: DC | PRN
  Administered 2014-04-14: 5 mg via ORAL
  Filled 2014-04-14: qty 1

## 2014-04-14 MED ORDER — VITAMIN D (CHOLECALCIFEROL) 25 MCG (1000 UT) PO CAPS
1000.0000 mg | ORAL_CAPSULE | Freq: Every day | ORAL | Status: DC
  Filled 2014-04-14 (×2): qty 1

## 2014-04-14 NOTE — ED Notes (Signed)
Recent dx of pneumonia, Today weak, sob, hypotension.  And recent death of daughter.

## 2014-04-14 NOTE — H&P (Signed)
[facility] History and Physical  Cody George RJJ:[id number] DOB: 01-10-1931 DOA: 04/14/2014  Referring physician: ER PCP: Delphina Cahill, MD   Chief Complaint: Cough  HPI: Cody George is a 81 y.o. male  This is an 81 year old man who gives a one-month history of chronic cough, mostly nonproductive but when it is productive, he says he has clear sputum. He was sent for a chest x-ray by her primary care physician 1 week ago and was diagnosed with pneumonia and was started on antibiotics. He has not improved. He has become weaker. He says that he has not become more dyspneic. He denies any PND or orthopnea. There is no ankle swelling. There is no chest pain, palpitations. He does have a history of paroxysmal atrial fibrillation and he is on chronic anticoagulation therapy. He has a history of pacemaker for tachybradycardia syndrome. He denies any weight loss. Evaluation in the emergency room shows a moderate to large right-sided pleural effusion, but also in the left side. He is now being admitted for further management.   Review of Systems:  Apart from symptoms above, all systems negative.  Past Medical History  Diagnosis Date  . Hypertension   . Dysrhythmia     PAF  . GERD (gastroesophageal reflux disease)   . Arthritis   . Cellulitis   . Pacemaker 07/22/2011    St. 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 OA (osteoarthritis)   . History of nuclear stress test 10/05/2010    dipyridamole; normal pattern of perfusion in all regions; no significant ishcemia, low risk scan   . Paroxysmal atrial fibrillation   . Prostate cancer     radiation  . Community acquired pneumonia 04/14/2014   Past Surgical History  Procedure Laterality Date  . Hernia repair    . Pacemaker insertion  07/22/2011    St. 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 Transthoracic echocardiogram  10/05/2010    EF=50-55%,  normal LV systolic function; LA & RA mildly dilated; mild MR; mild TR with RV systolic pressure elevted at 30-49mmHg; AV mildly sclerotic with mild calcif of AV leaflets, mild valvular AS, mild-mod regurg; trace pulm valve regurg  . US echocardiography  10/05/10    LA mildly dilated, mild TR, mild ca+ of AOV ,mild to mod. AI  . Nm myocar perf wall motion  10/05/10    normal  . Permanent pacemaker insertion N/A 07/22/2011    Procedure: PERMANENT PACEMAKER INSERTION;  Surgeon: Sanda Klein, MD;  Location: [place] CATH LAB;  Service: Cardiovascular;  Laterality: N/A;   Social History:  reports that he has been smoking.  He has never used smokeless tobacco. He reports that he does not drink alcohol or use illicit drugs.  No Known Allergies   Family history: No family history of lung disease.  Prior to Admission medications   Medication Sig Start Date End Date Taking? Authorizing Provider  amoxicillin-clavulanate (AUGMENTIN) 875-125 MG per tablet Take 1 tablet by mouth 2 (two) times daily.   Yes Historical Provider, MD  ELIQUIS 5 MG TABS tablet TAKE ONE TABLET BY MOUTH TWICE A DAY 12/26/13  Yes Pixie Casino, MD  ferrous sulfate 325 (65 FE) MG tablet Take 325 mg by mouth daily with breakfast.   Yes Historical Provider, MD  hydrochlorothiazide (HYDRODIURIL) 25 MG tablet Take 1 tablet by mouth daily. 06/05/13  Yes Historical Provider, MD  metoprolol succinate (TOPROL-XL) 25 MG 24  hr tablet TAKE ONE (1) TABLET BY MOUTH EVERY DAY 02/10/14  Yes Pixie Casino, MD  OVER THE COUNTER MEDICATION Take 4-5 oz by mouth daily. Cherry Juice   Yes Historical Provider, MD  pantoprazole (PROTONIX) 40 MG tablet Take 1 tablet by mouth daily. 11/23/12  Yes Historical Provider, MD  vitamin B-12 (CYANOCOBALAMIN) 1000 MCG tablet Take 1,000 mcg by mouth daily.   Yes Historical Provider, MD  VITAMIN C, CALCIUM ASCORBATE, PO Take 1,000 mg by mouth daily.    Yes Historical Provider, MD  VITAMIN D, CHOLECALCIFEROL, PO Take  1,000 mg by mouth daily.    Yes Historical Provider, MD   Physical Exam: Filed Vitals:   04/14/14 1730 04/14/14 1800 04/14/14 1830 04/14/14 1900  BP: 123/95 122/77 113/73 124/76  Pulse: 52  103 72  Temp:      TempSrc:      Resp: 19 28 20 18   Height:      Weight:      SpO2: 92%  92% 92%    Wt Readings from Last 3 Encounters:  04/14/14 74.532 kg (164 lb 5 oz)  02/18/14 76.159 kg (167 lb 14.4 oz)  12/12/13 75.569 kg (166 lb 9.6 oz)    General:  Appears calm and comfortable. There is no clubbing. There is no peripheral or central cyanosis. Eyes: PERRL, normal lids, irises & conjunctiva ENT: grossly normal hearing, lips & tongue Neck: no LAD, masses or thyromegaly. No neck lymphadenopathy. Cardiovascular: RRR, no m/r/g. No LE edema. He does not appear to have clinical features of heart failure. Telemetry: SR, no arrhythmias  Respiratory: Dullness to percussion in the right mid and lower zones and also in the left lower zone. Absent breath sounds in the right middle and lower zones and left lower zone. There is no bronchial breathing or crackles. There is no wheezing. Abdomen: soft, ntnd Skin: no rash or induration seen on limited exam Musculoskeletal: grossly normal tone BUE/BLE Psychiatric: grossly normal mood and affect, speech fluent and appropriate Neurologic: grossly non-focal.          Labs on Admission:  Basic Metabolic Panel:  Recent Labs Lab 04/14/14 1550  NA 127*  K 3.3*  CL 94*  CO2 28  GLUCOSE 139*  BUN 18  CREATININE 0.67  CALCIUM 8.6   Liver Function Tests:  Recent Labs Lab 04/14/14 1646  AST 42*  ALT 48  ALKPHOS 83  BILITOT 0.6  PROT 6.0  ALBUMIN 2.4*   No results for input(s): LIPASE, AMYLASE in the last 168 hours. No results for input(s): AMMONIA in the last 168 hours. CBC:  Recent Labs Lab 04/14/14 1550  WBC 16.4*  NEUTROABS 13.7*  HGB 12.6*  HCT 36.0*  MCV 88.7  PLT SPECIMEN CHECKED FOR CLOTS   Cardiac Enzymes:  Recent  Labs Lab 04/14/14 1550  TROPONINI <0.03    BNP (last 3 results) No results for input(s): BNP in the last 8760 hours.  ProBNP (last 3 results) No results for input(s): PROBNP in the last 8760 hours.  CBG: No results for input(s): GLUCAP in the last 168 hours.  Radiological Exams on Admission: Dg Chest Portable 1 View  04/14/2014   CLINICAL DATA:  Weakness, shortness of breath, hypotension  EXAM: PORTABLE CHEST - 1 VIEW  COMPARISON:  04/07/2014  FINDINGS: Increased partly loculated moderate right pleural effusion is identified, obscuring the right lung base. Trace left pleural fluid is present. No focal left-sided pulmonary opacity. Left-sided pacer remains in place. Moderate enlargement of the  cardiac silhouette and aortic unfolding reidentified with central vascular congestion.  IMPRESSION: Increased now moderate partly loculated right pleural effusion obscuring the lung base.   Electronically Signed   By: Conchita Paris M.D.   On: 04/14/2014 16:19    EKG: Initial electrocardiogram showed atrial fibrillation with rapid ventricular response. Since being given intravenous Cardizem and ventricular rate being slower, he has now converted to sinus rhythm. There is no acute ST-T wave changes.  Assessment/Plan   1. Community-acquired pneumonia. This appears to be the main diagnosis and he will be treated appropriately with intravenous antibiotics. However, he does appear to have bilateral pleural effusions and I'm going to investigate this further with a CT scan of the chest. We will ask pulmonology to also review him tomorrow. 2. Paroxysmal atrial fibrillation on chronic anticoagulation therapy. He appears to be now in sinus rhythm. We will obtain echocardiogram to make sure he does not also have heart failure accounting for his pleural effusions. Clinically he does not appear to have heart failure on examination. 3. Status post pacemaker for tachybradycardia syndrome. 4. History of prostate  cancer.  Further recommendations will depend on patient's hospital progress.   Code Status: Full code.   DVT Prophylaxis: Eliquis  Family Communication: I discussed the plan with the patient at the bedside.  Disposition Plan: Home when medically stable.   Time spent: 60 minutes.  Doree Albee [facility] Pager [phone].

## 2014-04-14 NOTE — Progress Notes (Signed)
I was notified by telemetry that pt has went from NSR to A.fib. MD made aware. Pt is asymptomatic and will continue to monitor

## 2014-04-14 NOTE — ED Provider Notes (Signed)
CSN: [id number]     Arrival date & time 04/14/14  1540 History  This chart was scribed for Fredia Sorrow, MD by Dellis Filbert, ED Scribe. The patient was seen in APA02/APA02 and the patient's care was started at 4:12 PM.  Chief Complaint  Patient presents with  . Weakness   Patient is a 81 y.o. male presenting with weakness. The history is provided by the patient and a relative. No language interpreter was used.  Weakness This is a new problem. The current episode started more than 2 days ago. The problem occurs constantly. The problem has been gradually worsening. Associated symptoms include shortness of breath. Pertinent negatives include no chest pain and no headaches. Nothing relieves the symptoms.    HPI Comments: Cody George is a 81 y.o. male with a history of atrial fibrillation who presents to the Emergency Department complaining of generalized weakness ongoing for 4 days ago. He was diagnosed with pneumonia 2 weeks ago. A chest x-ray was done and he was given amoxicillin 125 mg BID. He took his last dose today his cough has improved and is producing very little phlegm but he notes feeling progressively weaker. Pt lives at home. He has taken his regular medications today. His daughter recently passed away and her funeral is tomorrow. SpO2% between 92-93 while in the room.  Past Medical History  Diagnosis Date  . Hypertension   . Dysrhythmia     PAF  . GERD (gastroesophageal reflux disease)   . Arthritis   . Cellulitis   . Pacemaker 07/22/2011    St. 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 OA (osteoarthritis)   . History of nuclear stress test 10/05/2010    dipyridamole; normal pattern of perfusion in all regions; no significant ishcemia, low risk scan   . Paroxysmal atrial fibrillation   . Prostate cancer     radiation   Past Surgical History  Procedure Laterality Date  . Hernia repair    . Pacemaker insertion  07/22/2011     St. 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 Transthoracic echocardiogram  10/05/2010    EF=50-55%, normal LV systolic function; LA & RA mildly dilated; mild MR; mild TR with RV systolic pressure elevted at 30-13mmHg; AV mildly sclerotic with mild calcif of AV leaflets, mild valvular AS, mild-mod regurg; trace pulm valve regurg  . US echocardiography  10/05/10    LA mildly dilated, mild TR, mild ca+ of AOV ,mild to mod. AI  . Nm myocar perf wall motion  10/05/10    normal  . Permanent pacemaker insertion N/A 07/22/2011    Procedure: PERMANENT PACEMAKER INSERTION;  Surgeon: Sanda Klein, MD;  Location: [facility];  Service: Cardiovascular;  Laterality: N/A;   History reviewed. No pertinent family history. History  Substance Use Topics  . Smoking status: Current Every Day Smoker -- 1.00 packs/day for 65 years  . Smokeless tobacco: Never Used     Comment: now 3/4 ppd  . Alcohol Use: No    Review of Systems  Constitutional: Negative for fever and chills.  HENT: Positive for congestion.   Eyes: Negative for visual disturbance.  Respiratory: Positive for cough and shortness of breath.   Cardiovascular: Negative for chest pain and leg swelling.  Gastrointestinal: Negative for nausea, vomiting, diarrhea and abdominal distention.  Genitourinary: Negative for dysuria and hematuria.  Musculoskeletal: Positive for back pain.  Skin: Negative for rash.  Neurological: Positive for weakness. Negative for headaches.  Hematological: Bruises/bleeds easily.  Psychiatric/Behavioral: Negative for confusion.    Allergies  Review of patient's allergies indicates no known allergies.  Home Medications   Prior to Admission medications   Medication Sig Start Date End Date Taking? Authorizing Provider  amoxicillin-clavulanate (AUGMENTIN) 875-125 MG per tablet Take 1 tablet by mouth 2 (two) times daily.   Yes Historical Provider, MD  ELIQUIS 5 MG TABS tablet TAKE  ONE TABLET BY MOUTH TWICE A DAY 12/26/13  Yes Pixie Casino, MD  ferrous sulfate 325 (65 FE) MG tablet Take 325 mg by mouth daily with breakfast.   Yes Historical Provider, MD  hydrochlorothiazide (HYDRODIURIL) 25 MG tablet Take 1 tablet by mouth daily. 06/05/13  Yes Historical Provider, MD  metoprolol succinate (TOPROL-XL) 25 MG 24 hr tablet TAKE ONE (1) TABLET BY MOUTH EVERY DAY 02/10/14  Yes Pixie Casino, MD  OVER THE COUNTER MEDICATION Take 4-5 oz by mouth daily. Cherry Juice   Yes Historical Provider, MD  pantoprazole (PROTONIX) 40 MG tablet Take 1 tablet by mouth daily. 11/23/12  Yes Historical Provider, MD  vitamin B-12 (CYANOCOBALAMIN) 1000 MCG tablet Take 1,000 mcg by mouth daily.   Yes Historical Provider, MD  VITAMIN C, CALCIUM ASCORBATE, PO Take 1,000 mg by mouth daily.    Yes Historical Provider, MD  VITAMIN D, CHOLECALCIFEROL, PO Take 1,000 mg by mouth daily.    Yes Historical Provider, MD   BP 122/77 mmHg  Pulse 52  Temp(Src) 97.7 F (36.5 C) (Oral)  Resp 28  Ht 5\' 9"  (1.753 m)  Wt 164 lb 5 oz (74.532 kg)  BMI 24.25 kg/m2  SpO2 92% Physical Exam  Constitutional: He is oriented to person, place, and time. He appears well-developed and well-nourished.  HENT:  Head: Normocephalic.  Mouth/Throat: Oropharynx is clear and moist.  Eyes: Conjunctivae and EOM are normal. Pupils are equal, round, and reactive to light. No scleral icterus.  Cardiovascular: An irregular rhythm present. Tachycardia present.   No murmur heard. Pulmonary/Chest: Breath sounds normal. He has no wheezes. He has no rales.  Abdominal: Bowel sounds are normal. There is no tenderness.  Musculoskeletal: He exhibits no edema.  Neurological: He is alert and oriented to person, place, and time. He has normal reflexes. No cranial nerve deficit. He exhibits normal muscle tone. Coordination normal.  Skin: Skin is warm. No rash noted.  Nursing note and vitals reviewed.   ED Course  Procedures  DIAGNOSTIC  STUDIES: Oxygen Saturation is 95% on room air, normal by my interpretation.    COORDINATION OF CARE: 4:22 PM Discussed treatment plan with pt at bedside and pt agreed to plan.  Labs Review Labs Reviewed  CBC WITH DIFFERENTIAL/PLATELET - Abnormal; Notable for the following:    WBC 16.4 (*)    RBC 4.06 (*)    Hemoglobin 12.6 (*)    HCT 36.0 (*)    Neutrophils Relative % 84 (*)    Neutro Abs 13.7 (*)    Lymphocytes Relative 5 (*)    Monocytes Absolute 1.8 (*)    All other components within normal limits  BASIC METABOLIC PANEL - Abnormal; Notable for the following:    Sodium 127 (*)    Potassium 3.3 (*)    Chloride 94 (*)    Glucose, Bld 139 (*)    GFR calc non Af Amer 88 (*)    All other components within normal limits  HEPATIC FUNCTION PANEL - Abnormal; Notable for the following:  Albumin 2.4 (*)    AST 42 (*)    All other components within normal limits  CULTURE, BLOOD (ROUTINE X 2)  CULTURE, BLOOD (ROUTINE X 2)  URINE CULTURE  TROPONIN I  URINALYSIS, ROUTINE W REFLEX MICROSCOPIC  I-STAT CG4 LACTIC ACID, ED   Imaging Review Dg Chest Portable 1 View  04/14/2014   CLINICAL DATA:  Weakness, shortness of breath, hypotension  EXAM: PORTABLE CHEST - 1 VIEW  COMPARISON:  04/07/2014  FINDINGS: Increased partly loculated moderate right pleural effusion is identified, obscuring the right lung base. Trace left pleural fluid is present. No focal left-sided pulmonary opacity. Left-sided pacer remains in place. Moderate enlargement of the cardiac silhouette and aortic unfolding reidentified with central vascular congestion.  IMPRESSION: Increased now moderate partly loculated right pleural effusion obscuring the lung base.   Electronically Signed   By: Conchita Paris M.D.   On: 04/14/2014 16:19     EKG Interpretation   Date/Time:  Monday April 14 2014 15:54:21 EST Ventricular Rate:  145 PR Interval:    QRS Duration: 106 QT Interval:  314 QTC Calculation: 488 R Axis:    47 Text Interpretation:  Atrial fibrillation with rapid V-rate Minimal ST  depression, inferior leads Baseline wander in lead(s) I III aVL V4  Confirmed by Bary Limbach  MD, Kaliyan Osbourn ([id number]) on 04/14/2014 3:58:41 PM       CRITICAL CARE Performed by: Fredia Sorrow Total critical care time: 45 Critical care time was exclusive of separately billable procedures and treating other patients. Critical care was necessary to treat or prevent imminent or life-threatening deterioration. Critical care was time spent personally by me on the following activities: development of treatment plan with patient and/or surrogate as well as nursing, discussions with consultants, evaluation of patient's response to treatment, examination of patient, obtaining history from patient or surrogate, ordering and performing treatments and interventions, ordering and review of laboratory studies, ordering and review of radiographic studies, pulse oximetry and re-evaluation of patient's condition.       MDM   Final diagnoses:  URI (upper respiratory infection)  CAP (community acquired pneumonia)  Pleural effusion  Hypokalemia  Atrial fibrillation with rapid ventricular response   Patient presented with feeling very weak cough very fatigued. Cough occasionally productive. Patient has a history of atrial fibrillation is on Ella Quist. Patient's heart rate was in the 140s upon arrival with atrial fibrillation rapid ventricular response.  However there was initial concern that perhaps the patient was septic. No fluid challenges were attempted first while waiting for labs to come back. Patient never was hypotensive. His had a tachycardia. No significant improvement in the heart rate with fluids.  Patient just completed a course of Augmentin for presumed pneumonia. This would be a community acquired pneumonia. Patient does not stay at a nursing facility. Has not been hospitalized recently.  Chest x-ray week ago showed just  a small right pleural effusion. Now it's moderate in size with loculation. Also the beginning of the development of a left pleural effusion.  Patient's blood pressures maintained stable. Patient's lactic acid was less than 2. Patient given 10 mg of Cardizem IV push with some improvement in the heart rate but still around 120. Patient given second dose of 10 mg of Cardizem and heart rate now down into the upper 90s. Still in atrial fibrillation.  Blood cultures done. Patient started on community-acquired pneumonia antibiotics of Rocephin and Zithromax. In addition patient had hypokalemia. Patient given 10 mEq of potassium IV. Patient will require admission.  Patient's primary care doctor is Dr. Nevada Crane.  Following the Cardizem blood pressure has remained normal. Heart rate has improved. Patient without any hypoxia. But started on 2 L of nasal cannula oxygen. Patient's oxygen saturations were in the low 90s, on room air.  I personally performed the services described in this documentation, which was scribed in my presence. The recorded information has been reviewed and is accurate.       Fredia Sorrow, MD 04/14/14 [phone]

## 2014-04-14 NOTE — ED Notes (Signed)
Hospitalist at bedside 

## 2014-04-14 NOTE — ED Notes (Signed)
Speaking with pt and his wife.  Had a daughter to pass away this past weekend,  Daughters visitation is tomorrow and funeral is Wednesday.  Pt expresses wishes to go to her visitation and funeral.

## 2014-04-15 ENCOUNTER — Inpatient Hospital Stay (HOSPITAL_COMMUNITY): Payer: Medicare Other

## 2014-04-15 ENCOUNTER — Telehealth: Payer: Self-pay | Admitting: *Deleted

## 2014-04-15 ENCOUNTER — Encounter (HOSPITAL_COMMUNITY): Payer: Self-pay

## 2014-04-15 DIAGNOSIS — I4891 Unspecified atrial fibrillation: Secondary | ICD-10-CM

## 2014-04-15 DIAGNOSIS — Z7901 Long term (current) use of anticoagulants: Secondary | ICD-10-CM

## 2014-04-15 LAB — COMPREHENSIVE METABOLIC PANEL
ALBUMIN: 2.3 g/dL — AB (ref 3.5–5.2)
ALT: 43 U/L (ref 0–53)
ANION GAP: 3 — AB (ref 5–15)
AST: 37 U/L (ref 0–37)
Alkaline Phosphatase: 74 U/L (ref 39–117)
BILIRUBIN TOTAL: 0.7 mg/dL (ref 0.3–1.2)
BUN: 14 mg/dL (ref 6–23)
CHLORIDE: 96 mmol/L (ref 96–112)
CO2: 33 mmol/L — ABNORMAL HIGH (ref 19–32)
Calcium: 8.3 mg/dL — ABNORMAL LOW (ref 8.4–10.5)
Creatinine, Ser: 0.6 mg/dL (ref 0.50–1.35)
GFR calc Af Amer: >90 mL/min (ref >90)
GFR calc non Af Amer: >90 mL/min (ref >90)
Glucose, Bld: 109 mg/dL — ABNORMAL HIGH (ref 70–99)
Potassium: 3.5 mmol/L (ref 3.5–5.1)
Sodium: 132 mmol/L — ABNORMAL LOW (ref 135–145)
Total Protein: 5.7 g/dL — ABNORMAL LOW (ref 6.0–8.3)

## 2014-04-15 LAB — STREP PNEUMONIAE URINARY ANTIGEN: Strep Pneumo Urinary Antigen: NEGATIVE

## 2014-04-15 LAB — BODY FLUID CELL COUNT WITH DIFFERENTIAL
Eos, Fluid: 0 %
LYMPHS FL: 7 %
Monocyte-Macrophage-Serous Fluid: 1 % — ABNORMAL LOW (ref 50–90)
NEUTROPHIL FLUID: 92 % — AB (ref 0–25)
Total Nucleated Cell Count, Fluid: 697 cu mm (ref 0–1000)

## 2014-04-15 LAB — TROPONIN I: Troponin I: <0.03 ng/mL (ref <0.031)

## 2014-04-15 LAB — GLUCOSE, SEROUS FLUID: Glucose, Fluid: 58 mg/dL

## 2014-04-15 LAB — CBC
HEMATOCRIT: 33.3 % — AB (ref 39.0–52.0)
HEMOGLOBIN: 11.3 g/dL — AB (ref 13.0–17.0)
MCH: 30.6 pg (ref 26.0–34.0)
MCHC: 33.9 g/dL (ref 30.0–36.0)
MCV: 90.2 fL (ref 78.0–100.0)
Platelets: 438 10*3/uL — ABNORMAL HIGH (ref 150–400)
RBC: 3.69 MIL/uL — AB (ref 4.22–5.81)
RDW: 13.4 % (ref 11.5–15.5)
WBC: 13.2 10*3/uL — ABNORMAL HIGH (ref 4.0–10.5)

## 2014-04-15 LAB — LACTATE DEHYDROGENASE, PLEURAL OR PERITONEAL FLUID: LD, Fluid: 1850 U/L — ABNORMAL HIGH (ref 3–23)

## 2014-04-15 LAB — PROTEIN, BODY FLUID: Total protein, fluid: 4.1 g/dL

## 2014-04-15 MED ORDER — VITAMIN D 1000 UNITS PO TABS
1000.0000 [IU] | ORAL_TABLET | Freq: Every day | ORAL | Status: DC
  Administered 2014-04-15: 1000 [IU] via ORAL
  Filled 2014-04-15: qty 1

## 2014-04-15 MED ORDER — METOPROLOL TARTRATE 1 MG/ML IV SOLN
2.5000 mg | Freq: Once | INTRAVENOUS | Status: AC
  Administered 2014-04-15: 2.5 mg via INTRAVENOUS
  Filled 2014-04-15: qty 5

## 2014-04-15 MED ORDER — LEVOFLOXACIN 750 MG PO TABS: 750.0000 mg | ORAL_TABLET | Freq: Every day | ORAL | Status: DC

## 2014-04-15 NOTE — Progress Notes (Signed)
*  PRELIMINARY RESULTS* Echocardiogram 2D Echocardiogram has been performed.  Leavy Cella 04/15/2014, 3:16 PM

## 2014-04-15 NOTE — Progress Notes (Signed)
UR chart review completed.  

## 2014-04-15 NOTE — Progress Notes (Signed)
Pt is flipping between NSR and Afib. I contacted Dr.Kim who placed orders. Pt is asymptomatic at this time and I will continue to monitor

## 2014-04-15 NOTE — Progress Notes (Signed)
Patient discharged home today.  Patient was given discharge instructions, prescriptions, and care notes.  Patient verbalized understanding with no complaints or concerns voiced at this time.  Heart monitor was removed and IV was removed with catheter intact, no bleeding or complications.  Patient left unit in stable condition by staff member in a wheelchair.

## 2014-04-15 NOTE — Progress Notes (Signed)
Thoracentesis complete no signs of distress. 100ml amber colored pleural fluid removed.

## 2014-04-15 NOTE — Telephone Encounter (Signed)
Spoke to patient about increasing the Toprol XL from 25-50mg  per [facility]. Patient states that he is unsure if he was taking the Toprol at all. He states that he has pills in his pill box, but could not find the actual bottle. I told him to confirm with his daughter that he's been taking the 25 mg tablets before we increase to 50 mg. Patient voiced understanding. I asked him to call tomorrow with the answer. I gave him the [facility] ofc's number and Barbara's name. Will also forward message to [place].

## 2014-04-15 NOTE — Discharge Summary (Signed)
Physician Discharge Summary  Cody George:[id number] DOB: May 31, 1930 DOA: 04/14/2014  PCP: Delphina Cahill, MD  Admit date: 04/14/2014 Discharge date: 04/15/2014  Time spent: 45 minutes  Recommendations for Outpatient Follow-up:  -Will be discharged home today. -Advised to follow up with his PCP in 2 weeks, for pleural fluid studies, ECHO results and further work up.   Discharge Diagnoses:  Active Problems:   Prostate cancer   Pacemaker - St Jude Accent DR RF June 2013, sinus node arrest   Paroxysmal a-fib   Chronic anticoagulation   Pleural effusion   Community acquired pneumonia   Discharge Condition: Stable and improved  Filed Weights   04/14/14 1544 04/14/14 2016  Weight: 74.532 kg (164 lb 5 oz) 74.532 kg (164 lb 5 oz)    History of present illness:  This is an 81 year old man who gives a one-month history of chronic cough, mostly nonproductive but when it is productive, he says he has clear sputum. He was sent for a chest x-ray by her primary care physician 1 week ago and was diagnosed with pneumonia and was started on antibiotics. He has not improved. He has become weaker. He says that he has not become more dyspneic. He denies any PND or orthopnea. There is no ankle swelling. There is no chest pain, palpitations. He does have a history of paroxysmal atrial fibrillation and he is on chronic anticoagulation therapy. He has a history of pacemaker for tachybradycardia syndrome. He denies any weight loss. Evaluation in the emergency room shows a moderate to large right-sided pleural effusion, but also in the left side. He is now being admitted for further management.  Hospital Course:   Right Pleural Effusion/Cough -No oxygen requirements. -Given he is on Eliquis, only small amount of fluid was removed (17 cc) during thoracentesis for fluid studies which are pending at time of this summary. -ECHO pending to r/o CHF as cause of effusion. -Patient's daughter has  unfortunately passed away unexpectedly and today is her funeral and he would like to attend. As he is clinically doing well and no further tests are anticipated, will allow DC home today with close follow up with PCP. -No strong evidence for PNA, but will elect to treat for 5 days with Levaquin.  A Fib -Rate controlled. -Anticoagulated on eliquis.  Rest of chronic conditions stable and home medications have not been changed.  Procedures:  Right thoracentesis   Consultations:  Pulmonary, Dr. Luan Pulling  Discharge Instructions     Medication List    STOP taking these medications        amoxicillin-clavulanate 875-125 MG per tablet  Commonly known as:  AUGMENTIN     OVER THE COUNTER MEDICATION      TAKE these medications        ELIQUIS 5 MG Tabs tablet  Generic drug:  apixaban  TAKE ONE TABLET BY MOUTH TWICE A DAY     ferrous sulfate 325 (65 FE) MG tablet  Take 325 mg by mouth daily with breakfast.     hydrochlorothiazide 25 MG tablet  Commonly known as:  HYDRODIURIL  Take 1 tablet by mouth daily.     levofloxacin 750 MG tablet  Commonly known as:  LEVAQUIN  Take 1 tablet (750 mg total) by mouth daily.     metoprolol succinate 25 MG 24 hr tablet  Commonly known as:  TOPROL-XL  TAKE ONE (1) TABLET BY MOUTH EVERY DAY     pantoprazole 40 MG tablet  Commonly known as:  PROTONIX  Take 1 tablet by mouth daily.     vitamin B-12 1000 MCG tablet  Commonly known as:  CYANOCOBALAMIN  Take 1,000 mcg by mouth daily.     VITAMIN C (CALCIUM ASCORBATE) PO  Take 1,000 mg by mouth daily.     VITAMIN D (CHOLECALCIFEROL) PO  Take 1,000 mg by mouth daily.       No Known Allergies     Follow-up Information    Follow up with Delphina Cahill, MD. Schedule an appointment as soon as possible for a visit in 2 weeks.   Specialty:  Internal Medicine   Contact informationBartholomew Boards [phone]        The results of significant diagnostics from this hospitalization (including  imaging, microbiology, ancillary and laboratory) are listed below for reference.    Significant Diagnostic Studies: Dg Chest 1 View  04/15/2014   CLINICAL DATA:  Complex partially loculated RIGHT pleural effusion post diagnostic thoracentesis  EXAM: CHEST  1 VIEW  COMPARISON:  Expiratory chest radiograph compared to 04/14/2014  FINDINGS: Partially loculated RIGHT pleural effusion and RIGHT basilar atelectasis again identified.  Underlying emphysematous changes.  No pneumothorax post thoracentesis.  Atherosclerotic calcification tortuosity of thoracic aorta.  Stable heart size.  Pacemaker leads unchanged.  IMPRESSION: No pneumothorax following diagnostic RIGHT thoracentesis.  Persistent RIGHT basilar atelectasis and loculated pleural effusion.  Underlying COPD.   Electronically Signed   By: Lavonia Dana M.D.   On: 04/15/2014 12:03   Dg Chest 1 View  04/07/2014   CLINICAL DATA:  Chest pain.  Shortness of breath.  Cough.  EXAM: CHEST  1 VIEW  COMPARISON:  10/27/2011  FINDINGS: New small right pleural effusion or pleural thickening is seen. No evidence of pneumothorax. No evidence of pulmonary consolidation or edema.  Cardiomegaly again noted as well as dual lead transvenous pacemaker.  IMPRESSION: New small right pleural effusion versus pleural thickening.  Cardiomegaly.  No evidence of pulmonary consolidation or edema.   Electronically Signed   By: Earle Gell M.D.   On: 04/07/2014 17:55   Ct Chest W Contrast  04/14/2014   CLINICAL DATA:  Short of breath 2 days and pneumonia may  EXAM: CT CHEST WITH CONTRAST  TECHNIQUE: Multidetector CT imaging of the chest was performed during intravenous contrast administration.  CONTRAST:  58mL OMNIPAQUE IOHEXOL 300 MG/ML  SOLN  COMPARISON:  Radiograph 04/14/2014  FINDINGS: Mediastinum/Nodes: No axillary or supraclavicular lymphadenopathy. No mediastinal hilar adenopathy. No pericardial fluid.  Lungs/Pleura: There is moderate volume loculated pleural fluid in the right hemi  thorax. Fluid extends along the oblique fissure. There is associated lower lobe passive atelectasis. No pulmonary edema.  There is a smaller loculated fluid in the left lower lobe associated passive of the left lower lobe.  Upper lobes are clear.  No evidence of airspace disease.  Upper abdomen: Limited view of the liver, kidneys, pancreas are unremarkable. Normal adrenal glands.  Musculoskeletal: No acute findings  IMPRESSION: With  1. Bilateral moderate loculated pleural effusions with bilateral lower lobe atelectasis. 2. No evidence of pneumonia.   Electronically Signed   By: Suzy Bouchard M.D.   On: 04/14/2014 20:27   Dg Chest Portable 1 View  04/14/2014   CLINICAL DATA:  Weakness, shortness of breath, hypotension  EXAM: PORTABLE CHEST - 1 VIEW  COMPARISON:  04/07/2014  FINDINGS: Increased partly loculated moderate right pleural effusion is identified, obscuring the right lung base. Trace left pleural fluid is present. No focal left-sided pulmonary opacity.  Left-sided pacer remains in place. Moderate enlargement of the cardiac silhouette and aortic unfolding reidentified with central vascular congestion.  IMPRESSION: Increased now moderate partly loculated right pleural effusion obscuring the lung base.   Electronically Signed   By: Conchita Paris M.D.   On: 04/14/2014 16:19   US Thoracentesis Asp Pleural Space W/img Guide  04/15/2014   CLINICAL DATA:  Partially loculated RIGHT pleural effusion  EXAM: ULTRASOUND GUIDED DIAGNOSTIC RIGHT THORACENTESIS  COMPARISON:  CT chest 04/14/2014  PROCEDURE: Procedure, benefits, and risks of procedure were discussed with patient.  Written informed consent for procedure was obtained.  Time out protocol followed.  Pleural effusion localized by ultrasound at the posterior RIGHT hemithorax.  Visualized plural effusion is loculated and complex containing diffuse low level internal echoes.  Skin prepped and draped in usual sterile fashion.  Skin and soft tissues  anesthetized with 5 mL of 1% lidocaine.  5 [place] Yueh catheter placed into the RIGHT pleural space.  17 mL of dark yellow fluid aspirated by syringe pump.  No more fluid could be aspirated due to the complex nature of the fluid.  Procedure tolerated well by patient without immediate complication.  FINDINGS: A total of approximately 17 mL of RIGHT pleural fluid was removed. Entire sample was sent for laboratory analysis.  IMPRESSION: Successful ultrasound guided diagnostic RIGHT thoracentesis yielding 17 mL of pleural fluid.   Electronically Signed   By: Lavonia Dana M.D.   On: 04/15/2014 12:02    Microbiology: Recent Results (from the past 240 hour(s))  Culture, blood (routine x 2)     Status: None (Preliminary result)   Collection Time: 04/14/14  4:46 PM  Result Value Ref Range Status   Specimen Description BLOOD RIGHT HAND  Final   Special Requests BOTTLES DRAWN AEROBIC AND ANAEROBIC 6CC  Final   Culture NO GROWTH 1 DAY  Final   Report Status PENDING  Incomplete  Culture, blood (routine x 2)     Status: None (Preliminary result)   Collection Time: 04/14/14  4:51 PM  Result Value Ref Range Status   Specimen Description BLOOD LEFT ANTECUBITAL  Final   Special Requests BOTTLES DRAWN AEROBIC AND ANAEROBIC [place]  Final   Culture NO GROWTH 1 DAY  Final   Report Status PENDING  Incomplete     Labs: Basic Metabolic Panel:  Recent Labs Lab 04/14/14 1550 04/15/14 0545  NA 127* 132*  K 3.3* 3.5  CL 94* 96  CO2 28 33*  GLUCOSE 139* 109*  BUN 18 14  CREATININE 0.67 0.60  CALCIUM 8.6 8.3*   Liver Function Tests:  Recent Labs Lab 04/14/14 1646 04/15/14 0545  AST 42* 37  ALT 48 43  ALKPHOS 83 74  BILITOT 0.6 0.7  PROT 6.0 5.7*  ALBUMIN 2.4* 2.3*   No results for input(s): LIPASE, AMYLASE in the last 168 hours. No results for input(s): AMMONIA in the last 168 hours. CBC:  Recent Labs Lab 04/14/14 1550 04/15/14 0545  WBC 16.4* 13.2*  NEUTROABS 13.7*  --   HGB 12.6* 11.3*    HCT 36.0* 33.3*  MCV 88.7 90.2  PLT SPECIMEN CHECKED FOR CLOTS 438*   Cardiac Enzymes:  Recent Labs Lab 04/14/14 1550 04/15/14 0545  TROPONINI <0.03 <0.03   BNP: BNP (last 3 results) No results for input(s): BNP in the last 8760 hours.  ProBNP (last 3 results) No results for input(s): PROBNP in the last 8760 hours.  CBG: No results for input(s): GLUCAP in the last 168  hours.     Signed:  Lelon Frohlich  [facility] Pager: [phone] 04/15/2014, 6:26 PM

## 2014-04-15 NOTE — Consult Note (Signed)
Consult requested by: [facility] Consult requested for pleural effusion:  HPI: This is an 81 year old who said he's been sick for about 3 weeks. He started off with what he thought was a an upper respiratory infection and initially seemed to be getting better then developed increasing problems. He went to see his primary care physician and had a chest x-ray done. This showed a pleural effusion on the right which was fairly small. he was being treated for community-acquired pneumonia but continued having increasing problems with weakness shortness of breath cough congestion and eventually came to the emergency department. He had a CT done in the emergency department which showed bilateral pleural effusions and no definite pneumonia. He is coughing up sputum however. He denies fever. He has been short of breath. He has an approximately 80-pack-year smoking history but is trying to cut down and stop. He has a history of atrial fibrillation and is on chronic anticoagulation and has a pacemaker in place.  Past Medical History  Diagnosis Date  . Hypertension   . Dysrhythmia     PAF  . GERD (gastroesophageal reflux disease)   . Arthritis   . Cellulitis   . Pacemaker 07/22/2011    St. 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 OA (osteoarthritis)   . History of nuclear stress test 10/05/2010    dipyridamole; normal pattern of perfusion in all regions; no significant ishcemia, low risk scan   . Paroxysmal atrial fibrillation   . Prostate cancer     radiation  . Community acquired pneumonia 04/14/2014     History reviewed. No pertinent family history.   History   Social History  . Marital Status: Married    Spouse Name: N/A  . Number of Children: 5  . Years of Education: N/A   Occupational History  .     Social History Main Topics  . Smoking status: Current Every Day Smoker -- 1.00 packs/day for 65 years  . Smokeless tobacco: Never Used   Comment: now 3/4 ppd  . Alcohol Use: No  . Drug Use: No  . Sexual Activity: Not Currently   Other Topics Concern  . None   Social History Narrative     ROS: He denies hemoptysis. He denies chest pain. No night sweats. Otherwise per the history and physical.    Objective: Vital signs in last 24 hours: Temp:  [97.7 F (36.5 C)-97.9 F (36.6 C)] 97.7 F (36.5 C) (03/01 0537) Pulse Rate:  [52-124] 62 (03/01 0537) Resp:  [18-38] 18 (03/01 0537) BP: (107-124)/(61-95) 112/61 mmHg (03/01 0537) SpO2:  [91 %-95 %] 94 % (03/01 0537) Weight:  [74.532 kg (164 lb 5 oz)] 74.532 kg (164 lb 5 oz) (02/29 2016) Weight change:  Last BM Date: 04/14/14  Intake/Output from previous day: 02/29 0701 - 03/01 0700 In: 700 [I.V.:700] Out: 350 [Urine:350]  PHYSICAL EXAM He is awake and alert. He is in no acute distress. His pupils are reactive. Nose and throat are clear. His neck is supple without masses. His chest shows rhonchi bilaterally and mildly diminished breath sounds on the right. His heart is irregular. His abdomen is soft. He has no edema. Central nervous system examination is grossly intact  Lab Results: Basic Metabolic Panel:  Recent Labs  04/14/14 1550 04/15/14 0545  NA 127* 132*  K 3.3* 3.5  CL 94* 96  CO2 28 33*  GLUCOSE 139* 109*  BUN 18 14  CREATININE 0.67 0.60  CALCIUM  8.6 8.3*   Liver Function Tests:  Recent Labs  04/14/14 1646 04/15/14 0545  AST 42* 37  ALT 48 43  ALKPHOS 83 74  BILITOT 0.6 0.7  PROT 6.0 5.7*  ALBUMIN 2.4* 2.3*   No results for input(s): LIPASE, AMYLASE in the last 72 hours. No results for input(s): AMMONIA in the last 72 hours. CBC:  Recent Labs  04/14/14 1550 04/15/14 0545  WBC 16.4* 13.2*  NEUTROABS 13.7*  --   HGB 12.6* 11.3*  HCT 36.0* 33.3*  MCV 88.7 90.2  PLT SPECIMEN CHECKED FOR CLOTS 438*   Cardiac Enzymes:  Recent Labs  04/14/14 1550 04/15/14 0545  TROPONINI <0.03 <0.03   BNP: No results for input(s): PROBNP  in the last 72 hours. D-Dimer: No results for input(s): DDIMER in the last 72 hours. CBG: No results for input(s): GLUCAP in the last 72 hours. Hemoglobin A1C: No results for input(s): HGBA1C in the last 72 hours. Fasting Lipid Panel: No results for input(s): CHOL, HDL, LDLCALC, TRIG, CHOLHDL, LDLDIRECT in the last 72 hours. Thyroid Function Tests: No results for input(s): TSH, T4TOTAL, FREET4, T3FREE, THYROIDAB in the last 72 hours. Anemia Panel: No results for input(s): VITAMINB12, FOLATE, FERRITIN, TIBC, IRON, RETICCTPCT in the last 72 hours. Coagulation: No results for input(s): LABPROT, INR in the last 72 hours. Urine Drug Screen: Drugs of Abuse  No results found for: LABOPIA, COCAINSCRNUR, LABBENZ, AMPHETMU, THCU, LABBARB  Alcohol Level: No results for input(s): ETH in the last 72 hours. Urinalysis:  Recent Labs  04/14/14 1645  COLORURINE YELLOW  LABSPEC 1.010  PHURINE 7.0  GLUCOSEU NEGATIVE  HGBUR NEGATIVE  BILIRUBINUR NEGATIVE  KETONESUR NEGATIVE  PROTEINUR NEGATIVE  UROBILINOGEN 1.0  NITRITE NEGATIVE  LEUKOCYTESUR NEGATIVE   Misc. Labs:   ABGS: No results for input(s): PHART, PO2ART, TCO2, HCO3 in the last 72 hours.  Invalid input(s): PCO2   MICROBIOLOGY: Recent Results (from the past 240 hour(s))  Culture, blood (routine x 2)     Status: None (Preliminary result)   Collection Time: 04/14/14  4:46 PM  Result Value Ref Range Status   Specimen Description BLOOD RIGHT HAND  Final   Special Requests BOTTLES DRAWN AEROBIC AND ANAEROBIC 6CC  Final   Culture PENDING  Incomplete   Report Status PENDING  Incomplete  Culture, blood (routine x 2)     Status: None (Preliminary result)   Collection Time: 04/14/14  4:51 PM  Result Value Ref Range Status   Specimen Description BLOOD LEFT ANTECUBITAL  Final   Special Requests BOTTLES DRAWN AEROBIC AND ANAEROBIC [facility] [place]  Final   Culture PENDING  Incomplete   Report Status PENDING  Incomplete    Studies/Results: Ct  Chest W Contrast  04/14/2014   CLINICAL DATA:  Short of breath 2 days and pneumonia may  EXAM: CT CHEST WITH CONTRAST  TECHNIQUE: Multidetector CT imaging of the chest was performed during intravenous contrast administration.  CONTRAST:  72mL OMNIPAQUE IOHEXOL 300 MG/ML  SOLN  COMPARISON:  Radiograph 04/14/2014  FINDINGS: Mediastinum/Nodes: No axillary or supraclavicular lymphadenopathy. No mediastinal hilar adenopathy. No pericardial fluid.  Lungs/Pleura: There is moderate volume loculated pleural fluid in the right hemi thorax. Fluid extends along the oblique fissure. There is associated lower lobe passive atelectasis. No pulmonary edema.  There is a smaller loculated fluid in the left lower lobe associated passive of the left lower lobe.  Upper lobes are clear.  No evidence of airspace disease.  Upper abdomen: Limited view of the liver, kidneys, pancreas  are unremarkable. Normal adrenal glands.  Musculoskeletal: No acute findings  IMPRESSION: With  1. Bilateral moderate loculated pleural effusions with bilateral lower lobe atelectasis. 2. No evidence of pneumonia.   Electronically Signed   By: Suzy Bouchard M.D.   On: 04/14/2014 20:27   Dg Chest Portable 1 View  04/14/2014   CLINICAL DATA:  Weakness, shortness of breath, hypotension  EXAM: PORTABLE CHEST - 1 VIEW  COMPARISON:  04/07/2014  FINDINGS: Increased partly loculated moderate right pleural effusion is identified, obscuring the right lung base. Trace left pleural fluid is present. No focal left-sided pulmonary opacity. Left-sided pacer remains in place. Moderate enlargement of the cardiac silhouette and aortic unfolding reidentified with central vascular congestion.  IMPRESSION: Increased now moderate partly loculated right pleural effusion obscuring the lung base.   Electronically Signed   By: Conchita Paris M.D.   On: 04/14/2014 16:19    Medications:  Prior to Admission:  Prescriptions prior to admission  Medication Sig Dispense Refill Last  Dose  . amoxicillin-clavulanate (AUGMENTIN) 875-125 MG per tablet Take 1 tablet by mouth 2 (two) times daily.   04/14/2014 at Unknown time  . ELIQUIS 5 MG TABS tablet TAKE ONE TABLET BY MOUTH TWICE A DAY 60 tablet 5 04/14/2014 at Unknown time  . ferrous sulfate 325 (65 FE) MG tablet Take 325 mg by mouth daily with breakfast.   04/14/2014 at Unknown time  . hydrochlorothiazide (HYDRODIURIL) 25 MG tablet Take 1 tablet by mouth daily.   04/14/2014 at Unknown time  . metoprolol succinate (TOPROL-XL) 25 MG 24 hr tablet TAKE ONE (1) TABLET BY MOUTH EVERY DAY 30 tablet 9 04/14/2014 at Unknown time  . OVER THE COUNTER MEDICATION Take 4-5 oz by mouth daily. Cherry Juice   04/14/2014 at Unknown time  . pantoprazole (PROTONIX) 40 MG tablet Take 1 tablet by mouth daily.   04/14/2014 at Unknown time  . vitamin B-12 (CYANOCOBALAMIN) 1000 MCG tablet Take 1,000 mcg by mouth daily.   04/14/2014 at Unknown time  . VITAMIN C, CALCIUM ASCORBATE, PO Take 1,000 mg by mouth daily.    04/14/2014 at Unknown time  . VITAMIN D, CHOLECALCIFEROL, PO Take 1,000 mg by mouth daily.    04/14/2014 at Unknown time   Scheduled: . apixaban  5 mg Oral BID  . azithromycin  500 mg Intravenous Q24H  . cefTRIAXone (ROCEPHIN)  IV  1 g Intravenous Q24H  . cholecalciferol  1,000 Units Oral Daily  . ferrous sulfate  325 mg Oral Q breakfast  . hydrochlorothiazide  25 mg Oral Daily  . metoprolol succinate  25 mg Oral Daily  . pantoprazole  40 mg Oral Daily  . [START ON 05/13/2014] vitamin B-12  1,000 mcg Oral Daily   Continuous: . sodium chloride 75 mL/hr at 04/15/14 9417   EYC:[id number], zolpidem  Assesment: I think he has COPD exacerbation but considering how much trouble he's had I think it's reasonable to treat him as if he has community-acquired pneumonia. I do not see that on his chest x-ray or CT however. He has a somewhat loculated right pleural effusion and I discussed the situation with Dr. Thornton Papas. He feels that he can do thoracentesis  to get a small amount of fluid for studies. Active Problems:   Prostate cancer   Pacemaker - St Jude Accent DR RF June 2013, sinus node arrest   Paroxysmal a-fib   Chronic anticoagulation   Pleural effusion   Community acquired pneumonia    Plan: Continue antibiotics etc. Thoracentesis  today.    LOS: 1 day   Salina Stanfield L 04/15/2014, 8:42 AM

## 2014-04-15 NOTE — Procedures (Signed)
PreOperative Dx: RIGHT pleural effusion Postoperative Dx: RIGHT pleural effusion Procedure:   US guided diagnostic RIGHT thoracentesis Radiologist:  Thornton Papas Anesthesia:  5 ml of 1% lidocaine Specimen:  17 ml of dark yellow colored fluid EBL:   < 1 ml Complications: None

## 2014-04-15 NOTE — Progress Notes (Signed)
Informed during report that patient's daughter unexpectedly passed away and her funeral service is tomorrow afternoon at 2:00pm.  I notified CSW, CM, Dr. Luan Pulling, and Dr. Jerilee Hoh.  We are working on trying to get the patient signed out for a couple of hours tomorrow so that he will be able to attend his daughter's funeral.  Will continue to follow this and keep the patient and family informed.

## 2014-04-16 LAB — URINE CULTURE
COLONY COUNT: NO GROWTH
CULTURE: NO GROWTH

## 2014-04-16 LAB — LEGIONELLA ANTIGEN, URINE

## 2014-04-16 LAB — PH, BODY FLUID: pH, Fluid: 8

## 2014-04-17 ENCOUNTER — Telehealth: Payer: Self-pay | Admitting: Cardiovascular Disease

## 2014-04-17 MED ORDER — METOPROLOL SUCCINATE ER 50 MG PO TB24: 50.0000 mg | ORAL_TABLET | Freq: Every day | ORAL | Status: DC

## 2014-04-17 NOTE — Telephone Encounter (Signed)
Pt said he was told by Tobin Chad to call and ask for you.

## 2014-04-17 NOTE — Telephone Encounter (Signed)
Patient notified to increase metoprolol succ. To 50mg  daily per Dr. Loletha Grayer.  New Rx sent to [facility].

## 2014-04-17 NOTE — Telephone Encounter (Signed)
Clarified with pharmacy staff that medication dose was increased on 04/15/14

## 2014-04-17 NOTE — Telephone Encounter (Signed)
Estill Bamberg was calling in to confirm the dosage on the pt's Metoprolol script. She says that it is written for 50mg  but it is usually 25mg  . Please f/u  She says that when you call back to ask for [facility] the pharmacist  THanks

## 2014-04-19 LAB — BODY FLUID CULTURE: CULTURE: NO GROWTH

## 2014-04-19 LAB — CULTURE, BLOOD (ROUTINE X 2)
CULTURE: NO GROWTH
Culture: NO GROWTH

## 2014-04-23 DIAGNOSIS — J90 Pleural effusion, not elsewhere classified: Secondary | ICD-10-CM | POA: Diagnosis not present

## 2014-04-23 DIAGNOSIS — R05 Cough: Secondary | ICD-10-CM | POA: Diagnosis not present

## 2014-04-23 DIAGNOSIS — L0233 Carbuncle of buttock: Secondary | ICD-10-CM | POA: Diagnosis not present

## 2014-04-23 DIAGNOSIS — Z6824 Body mass index (BMI) 24.0-24.9, adult: Secondary | ICD-10-CM | POA: Diagnosis not present

## 2014-04-24 ENCOUNTER — Encounter: Payer: Self-pay | Admitting: Cardiovascular Disease

## 2014-05-20 ENCOUNTER — Ambulatory Visit (INDEPENDENT_AMBULATORY_CARE_PROVIDER_SITE_OTHER): Payer: Medicare Other | Admitting: *Deleted

## 2014-05-20 ENCOUNTER — Encounter: Payer: Self-pay | Admitting: Cardiovascular Disease

## 2014-05-20 DIAGNOSIS — I495 Sick sinus syndrome: Secondary | ICD-10-CM | POA: Diagnosis not present

## 2014-05-20 LAB — MDC_IDC_ENUM_SESS_TYPE_REMOTE
Battery Remaining Longevity: 84 mo
Battery Remaining Percentage: 74 %
Battery Voltage: 2.95 V
Brady Statistic AP VP Percent: 1 %
Brady Statistic RA Percent Paced: 86 %
Brady Statistic RV Percent Paced: 2.1 %
Date Time Interrogation Session: 20160405091849
Implantable Pulse Generator Model: 2210
Implantable Pulse Generator Serial Number: 7353349
Lead Channel Impedance Value: 410 Ohm
Lead Channel Pacing Threshold Amplitude: 0.875 V
Lead Channel Pacing Threshold Pulse Width: 0.4 ms
Lead Channel Sensing Intrinsic Amplitude: 3.6 mV
Lead Channel Setting Pacing Amplitude: 1.875
Lead Channel Setting Sensing Sensitivity: 2 mV
MDC IDC MSMT LEADCHNL RV IMPEDANCE VALUE: 480 Ohm
MDC IDC MSMT LEADCHNL RV PACING THRESHOLD AMPLITUDE: 1.25 V
MDC IDC MSMT LEADCHNL RV PACING THRESHOLD PULSEWIDTH: 0.4 ms
MDC IDC MSMT LEADCHNL RV SENSING INTR AMPL: 12 mV
MDC IDC SET LEADCHNL RV PACING AMPLITUDE: 2.5 V
MDC IDC SET LEADCHNL RV PACING PULSEWIDTH: 0.4 ms
MDC IDC STAT BRADY AP VS PERCENT: 97 %
MDC IDC STAT BRADY AS VP PERCENT: 1 %
MDC IDC STAT BRADY AS VS PERCENT: 2.8 %

## 2014-05-20 NOTE — Progress Notes (Signed)
Remote pacemaker transmission.   

## 2014-05-27 ENCOUNTER — Encounter: Payer: Self-pay | Admitting: Cardiology

## 2014-05-28 LAB — AFB CULTURE WITH SMEAR (NOT AT ARMC): Acid Fast Smear: NONE SEEN

## 2014-06-16 DIAGNOSIS — R5383 Other fatigue: Secondary | ICD-10-CM | POA: Diagnosis not present

## 2014-06-16 DIAGNOSIS — Z79899 Other long term (current) drug therapy: Secondary | ICD-10-CM | POA: Diagnosis not present

## 2014-06-16 DIAGNOSIS — E559 Vitamin D deficiency, unspecified: Secondary | ICD-10-CM | POA: Diagnosis not present

## 2014-06-18 DIAGNOSIS — R5383 Other fatigue: Secondary | ICD-10-CM | POA: Diagnosis not present

## 2014-06-18 DIAGNOSIS — Z79899 Other long term (current) drug therapy: Secondary | ICD-10-CM | POA: Diagnosis not present

## 2014-06-19 ENCOUNTER — Telehealth: Payer: Self-pay | Admitting: Internal Medicine

## 2014-06-19 NOTE — Telephone Encounter (Signed)
Pt's wife called in stating that his PCP wanted him to be soon as because he thinks that he is retaining fluid around his lungs. Please call  Thanks

## 2014-06-19 NOTE — Telephone Encounter (Signed)
Is there any way we can get this pt. In to see Dr. Debara Pickett before June , we may have let him see a PA or NP on a day that Dr. Debara Pickett is in the office

## 2014-06-20 ENCOUNTER — Telehealth: Payer: Self-pay | Admitting: Internal Medicine

## 2014-06-20 NOTE — Telephone Encounter (Signed)
Billie scheduled pt to come in and be seen by Mickel Baas on 5/25

## 2014-06-20 NOTE — Telephone Encounter (Signed)
Closed encounter °

## 2014-06-23 ENCOUNTER — Telehealth: Payer: Self-pay | Admitting: Internal Medicine

## 2014-06-23 NOTE — Telephone Encounter (Signed)
Pt's wife called in stating that he fell on his chest earlier today and she wanted to know if that would affect his pace maker. Please call her back  Thanks

## 2014-06-23 NOTE — Telephone Encounter (Signed)
Assisted pt in sending manual transmission. 

## 2014-06-23 NOTE — Telephone Encounter (Signed)
Spoke with patient's wife since he was not home- she reports he feels ok aside from some chest soreness where he fell. As a precaution I instructed the wife how to send a remote transmission once the patient arrives at home for review. She agrees. If further help needed with remote monitor she will call back.

## 2014-06-23 NOTE — Telephone Encounter (Signed)
Called pt -reports falling on face and chest. Does not think he hit pacemaker directly, but can't be certain.  Asked about other obvious injury - he denies any significant concerns otherwise. I stated that, put simply, I don't know if pacemaker could have been damaged w/ fall. Informed I would defer to device clinic, as pt put wife on the phone - she explained she wasn't sure how to do device upload.

## 2014-06-24 NOTE — Telephone Encounter (Signed)
Spoke w/pt's wife to let know transmission was normal.

## 2014-06-29 ENCOUNTER — Other Ambulatory Visit: Payer: Self-pay | Admitting: Internal Medicine

## 2014-06-30 NOTE — Telephone Encounter (Signed)
Rx has been sent to the pharmacy electronically. ° °

## 2014-07-01 ENCOUNTER — Other Ambulatory Visit (HOSPITAL_COMMUNITY): Payer: Self-pay | Admitting: Adult Health Nurse Practitioner

## 2014-07-01 ENCOUNTER — Ambulatory Visit (HOSPITAL_COMMUNITY)
Admission: RE | Admit: 2014-07-01 | Discharge: 2014-07-01 | Disposition: A | Discharge status: Home / Self Care | Payer: Medicare Other | Source: Ambulatory Visit | Attending: Dermatology | Admitting: Dermatology

## 2014-07-01 ENCOUNTER — Other Ambulatory Visit (HOSPITAL_COMMUNITY): Payer: Self-pay | Admitting: Dermatology

## 2014-07-01 DIAGNOSIS — R1011 Right upper quadrant pain: Secondary | ICD-10-CM | POA: Diagnosis not present

## 2014-07-01 DIAGNOSIS — R0781 Pleurodynia: Secondary | ICD-10-CM | POA: Insufficient documentation

## 2014-07-01 DIAGNOSIS — J449 Chronic obstructive pulmonary disease, unspecified: Secondary | ICD-10-CM | POA: Diagnosis not present

## 2014-07-01 DIAGNOSIS — R079 Chest pain, unspecified: Secondary | ICD-10-CM | POA: Diagnosis not present

## 2014-07-01 DIAGNOSIS — S299XXA Unspecified injury of thorax, initial encounter: Secondary | ICD-10-CM | POA: Diagnosis not present

## 2014-07-01 DIAGNOSIS — C61 Malignant neoplasm of prostate: Secondary | ICD-10-CM | POA: Diagnosis not present

## 2014-07-01 DIAGNOSIS — I499 Cardiac arrhythmia, unspecified: Secondary | ICD-10-CM | POA: Insufficient documentation

## 2014-07-01 DIAGNOSIS — W19XXXA Unspecified fall, initial encounter: Secondary | ICD-10-CM

## 2014-07-01 DIAGNOSIS — F17210 Nicotine dependence, cigarettes, uncomplicated: Secondary | ICD-10-CM | POA: Insufficient documentation

## 2014-07-01 DIAGNOSIS — Z950 Presence of cardiac pacemaker: Secondary | ICD-10-CM | POA: Diagnosis not present

## 2014-07-01 DIAGNOSIS — Z8546 Personal history of malignant neoplasm of prostate: Secondary | ICD-10-CM | POA: Diagnosis not present

## 2014-07-09 ENCOUNTER — Ambulatory Visit: Payer: Medicare Other | Admitting: Cardiology

## 2014-07-09 ENCOUNTER — Encounter: Payer: Self-pay | Admitting: Internal Medicine

## 2014-07-09 ENCOUNTER — Ambulatory Visit (INDEPENDENT_AMBULATORY_CARE_PROVIDER_SITE_OTHER): Payer: Medicare Other | Admitting: Internal Medicine

## 2014-07-09 VITALS — BP 176/80 | HR 60 | Ht 69.0 in | Wt 164.9 lb

## 2014-07-09 DIAGNOSIS — J189 Pneumonia, unspecified organism: Secondary | ICD-10-CM

## 2014-07-09 DIAGNOSIS — Z720 Tobacco use: Secondary | ICD-10-CM

## 2014-07-09 DIAGNOSIS — R0602 Shortness of breath: Secondary | ICD-10-CM | POA: Diagnosis not present

## 2014-07-09 DIAGNOSIS — I350 Nonrheumatic aortic (valve) stenosis: Secondary | ICD-10-CM

## 2014-07-09 DIAGNOSIS — R5383 Other fatigue: Secondary | ICD-10-CM | POA: Diagnosis not present

## 2014-07-09 DIAGNOSIS — R918 Other nonspecific abnormal finding of lung field: Secondary | ICD-10-CM

## 2014-07-09 DIAGNOSIS — R0609 Other forms of dyspnea: Secondary | ICD-10-CM | POA: Diagnosis not present

## 2014-07-09 DIAGNOSIS — Z950 Presence of cardiac pacemaker: Secondary | ICD-10-CM

## 2014-07-09 DIAGNOSIS — F17200 Nicotine dependence, unspecified, uncomplicated: Secondary | ICD-10-CM

## 2014-07-09 DIAGNOSIS — I480 Paroxysmal atrial fibrillation: Secondary | ICD-10-CM

## 2014-07-09 NOTE — Progress Notes (Signed)
OFFICE NOTE  Chief Complaint:  DOE, fatigue  Primary Care Physician: Delphina Cahill, MD  HPI:  Cody George is an 81 year old gentleman who has a history of atrial fibrillation and tachy-brady syndrome as well as paroxysmal atrial fibrillation and sinus node arrest. He underwent a St. Jude Accent DR dual-chamber pacemaker in June of 2013. He has done fairly well other than he had some hypotension on lisinopril which was discontinued. He has recently had some tachyarrhythmias and had increased his metoprolol succinate to 25 mg daily due to tachyarrhythmias. He is enrolled in a home monitoring service with the [facility] patient care network. He was successfully transitioned over to Eliquis she is taking without any bleeding difficulty. He denies any shortness of breath or worsening chest pain. He is due for a remote pacer check in a few days.  Cody George returns for follow-up today. He is feeling well denies any chest pain or worsening shortness of breath. He has no palpitations. Blood pressure is well controlled. He had recent bilateral cataract surgery and is seeing much better. There are no bleeding issues.  I saw Cody George back in the office today for follow-up. Tells me a few months ago he had pneumonia and has been very slow to recover. Chest x-ray shows improvement however there are changes consistent with COPD. He has a greater than 65 year history of smoking. I do not believe he carries a diagnosis of COPD and is currently not on any medications for this. He reports some worsening shortness of breath as well has fatigue. He denies any particular chest pain. He does have valvular heart disease and history of aortic stenosis. Recent echo in March shows some worsening of valvular function with now moderate aortic stenosis.  PMHx:  Past Medical History  Diagnosis Date  . Hypertension   . Dysrhythmia     PAF  . GERD (gastroesophageal reflux disease)   . Arthritis   . Cellulitis   .  Pacemaker 07/22/2011    St. 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 OA (osteoarthritis)   . History of nuclear stress test 10/05/2010    dipyridamole; normal pattern of perfusion in all regions; no significant ishcemia, low risk scan   . Paroxysmal atrial fibrillation   . Prostate cancer     radiation  . Community acquired pneumonia 04/14/2014    Past Surgical History  Procedure Laterality Date  . Hernia repair    . Pacemaker insertion  07/22/2011    St. 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 Transthoracic echocardiogram  10/05/2010    EF=50-55%, normal LV systolic function; LA & RA mildly dilated; mild MR; mild TR with RV systolic pressure elevted at 30-54mmHg; AV mildly sclerotic with mild calcif of AV leaflets, mild valvular AS, mild-mod regurg; trace pulm valve regurg  . US echocardiography  10/05/10    LA mildly dilated, mild TR, mild ca+ of AOV ,mild to mod. AI  . Nm myocar perf wall motion  10/05/10    normal  . Permanent pacemaker insertion N/A 07/22/2011    Procedure: PERMANENT PACEMAKER INSERTION;  Surgeon: Sanda Klein, MD;  Location: [facility] CATH [facility];  Service: Cardiovascular;  Laterality: N/A;    FAMHx:  No family history on file.  SOCHx:   reports that he has been smoking.  He has never used smokeless tobacco. He reports that he does not drink alcohol or use illicit  drugs.  ALLERGIES:  No Known Allergies  ROS: A comprehensive review of systems was negative except for: Constitutional: positive for fatigue Respiratory: positive for dyspnea on exertion  HOME MEDS: Current Outpatient Prescriptions  Medication Sig Dispense Refill  . ELIQUIS 5 MG TABS tablet TAKE ONE TABLET BY MOUTH TWICE A DAY 60 tablet 6  . ferrous sulfate 325 (65 FE) MG EC tablet Take 325 mg by mouth daily with breakfast.    . metoprolol succinate (TOPROL-XL) 50 MG 24 hr tablet Take 1 tablet (50 mg total) by mouth daily.  Take with or immediately following a meal. 90 tablet 3  . NON FORMULARY daily. Cherry Juice - for gout    . pantoprazole (PROTONIX) 40 MG tablet Take 1 tablet by mouth daily.    . vitamin B-12 (CYANOCOBALAMIN) 1000 MCG tablet Take 1,000 mcg by mouth daily.    Marland Kitchen VITAMIN C, CALCIUM ASCORBATE, PO Take 1,000 mg by mouth daily.     Marland Kitchen VITAMIN D, CHOLECALCIFEROL, PO Take 1,000 mg by mouth daily.      No current facility-administered medications for this visit.    LABS/IMAGING: No results found for this or any previous visit (from the past 48 hour(s)). No results found.  VITALS: BP 176/80 mmHg  Pulse 60  Ht 5\' 9"  (1.753 m)  Wt 164 lb 14.4 oz (74.798 kg)  BMI 24.34 kg/m2  EXAM: General appearance: alert and no distress Neck: no carotid bruit and no JVD Lungs: diminished breath sounds bilaterally and rhonchi bilaterally Heart: regular rate and rhythm, S1, S2 normal and systolic murmur: Midsystolic 2/6, crescendo at 2nd right intercostal space Abdomen: soft, non-tender; bowel sounds normal; no masses,  no organomegaly Extremities: extremities normal, atraumatic, no cyanosis or edema Pulses: 2+ and symmetric Skin: Skin color, texture, turgor normal. No rashes or lesions Neurologic: Grossly normal Psych: Mood, affect normal, pleasant  EKG: Atrial paced rhythm at 60  ASSESSMENT: 1. Paroxysmal atrial fibrillation with sinus node arrest 2. Status post St. Jude Accent DR pacemaker 3. Labile hypertension - controlled 4. Chronic anticoagulation on Eliquis 5. Fatigue and dyspnea 6. Recent pneumonia and x-ray changes consistent with COPD 7. Aortic stenosis  PLAN: 1.   Cody George has had progressive fatigue and shortness of breath. He recently had a pneumonia but feels that he is not recovered. Chest x-ray is consistent with COPD which is not surprising given his long history of smoking. He is not currently on any medications for treatment of COPD. I would favor that he sees a pulmonologist  for assistance with his progressive shortness of breath. Another possibility of his fatigue could be certainly coronary artery disease. He's not had any stress evaluation for some time. I recommend a Lexiscan nuclear stress test. His echocardiogram was reassuring and the fact that he has normal LV function however there is probably moderate aortic stenosis. This will need to be followed closely with another echo in about a year.  Plan to see him back to discuss results of his study in a few weeks.  Pixie Casino, MD, [facility] Attending Cardiologist [place] 07/09/2014, 5:00 PM

## 2014-07-09 NOTE — Patient Instructions (Signed)
Your physician has requested that you have a lexiscan myoview. For further information please visit [email or web address]. Please follow instruction sheet, as given.  You have been referred to Dr. Keturah Barre - pulmonologist   Your physician recommends that you schedule a follow-up appointment in: 1 month with Dr. Debara Pickett (OK to double book if necessary)

## 2014-07-23 ENCOUNTER — Telehealth (HOSPITAL_COMMUNITY): Payer: Self-pay

## 2014-07-23 NOTE — Telephone Encounter (Signed)
Encounter complete. 

## 2014-07-25 ENCOUNTER — Ambulatory Visit (HOSPITAL_COMMUNITY)
Admission: RE | Admit: 2014-07-25 | Discharge: 2014-07-25 | Disposition: A | Discharge status: Home / Self Care | Payer: Medicare Other | Source: Ambulatory Visit | Attending: Cardiology | Admitting: Cardiology

## 2014-07-25 DIAGNOSIS — R9439 Abnormal result of other cardiovascular function study: Secondary | ICD-10-CM | POA: Insufficient documentation

## 2014-07-25 DIAGNOSIS — I350 Nonrheumatic aortic (valve) stenosis: Secondary | ICD-10-CM | POA: Diagnosis not present

## 2014-07-25 DIAGNOSIS — I10 Essential (primary) hypertension: Secondary | ICD-10-CM | POA: Diagnosis not present

## 2014-07-25 DIAGNOSIS — I4891 Unspecified atrial fibrillation: Secondary | ICD-10-CM | POA: Insufficient documentation

## 2014-07-25 DIAGNOSIS — Z950 Presence of cardiac pacemaker: Secondary | ICD-10-CM | POA: Diagnosis not present

## 2014-07-25 DIAGNOSIS — J449 Chronic obstructive pulmonary disease, unspecified: Secondary | ICD-10-CM | POA: Diagnosis not present

## 2014-07-25 DIAGNOSIS — R5383 Other fatigue: Secondary | ICD-10-CM | POA: Diagnosis not present

## 2014-07-25 DIAGNOSIS — R0602 Shortness of breath: Secondary | ICD-10-CM | POA: Diagnosis not present

## 2014-07-25 LAB — MYOCARDIAL PERFUSION IMAGING
CHL CUP NUCLEAR SRS: 0
CHL CUP NUCLEAR SSS: 7
CHL CUP RESTING HR STRESS: 60 {beats}/min
CSEPPHR: 71 {beats}/min
LV dias vol: 179 mL
LVSYSVOL: 89 mL
NUC STRESS EF: 50 %
NUC STRESS TID: 1.18
SDS: 7

## 2014-07-25 MED ORDER — TECHNETIUM TC 99M SESTAMIBI GENERIC - CARDIOLITE
10.6000 | Freq: Once | INTRAVENOUS | Status: AC | PRN
  Administered 2014-07-25: 11 via INTRAVENOUS

## 2014-07-25 MED ORDER — TECHNETIUM TC 99M SESTAMIBI GENERIC - CARDIOLITE
29.8000 | Freq: Once | INTRAVENOUS | Status: AC | PRN
  Administered 2014-07-25: 29.8 via INTRAVENOUS

## 2014-07-25 MED ORDER — REGADENOSON 0.4 MG/5ML IV SOLN
0.4000 mg | Freq: Once | INTRAVENOUS | Status: AC
  Administered 2014-07-25: 0.4 mg via INTRAVENOUS

## 2014-08-19 ENCOUNTER — Ambulatory Visit (INDEPENDENT_AMBULATORY_CARE_PROVIDER_SITE_OTHER): Payer: Medicare Other | Admitting: *Deleted

## 2014-08-19 ENCOUNTER — Encounter: Payer: Self-pay | Admitting: Cardiovascular Disease

## 2014-08-19 DIAGNOSIS — I495 Sick sinus syndrome: Secondary | ICD-10-CM | POA: Diagnosis not present

## 2014-08-19 NOTE — Progress Notes (Signed)
Remote pacemaker transmission.   

## 2014-08-24 LAB — CUP PACEART REMOTE DEVICE CHECK
Battery Remaining Longevity: 102 mo
Battery Remaining Percentage: 91 %
Battery Voltage: 2.95 V
Brady Statistic AP VP Percent: 1 %
Brady Statistic AP VS Percent: 98 %
Brady Statistic RA Percent Paced: 88 %
Brady Statistic RV Percent Paced: 2.4 %
Date Time Interrogation Session: 20160705074737
Lead Channel Impedance Value: 430 Ohm
Lead Channel Pacing Threshold Amplitude: 1 V
Lead Channel Pacing Threshold Pulse Width: 0.4 ms
Lead Channel Sensing Intrinsic Amplitude: 3.4 mV
MDC IDC MSMT LEADCHNL RV IMPEDANCE VALUE: 450 Ohm
MDC IDC MSMT LEADCHNL RV PACING THRESHOLD AMPLITUDE: 1.25 V
MDC IDC MSMT LEADCHNL RV PACING THRESHOLD PULSEWIDTH: 0.4 ms
MDC IDC MSMT LEADCHNL RV SENSING INTR AMPL: 12 mV
MDC IDC SET LEADCHNL RA PACING AMPLITUDE: 2 V
MDC IDC SET LEADCHNL RV PACING AMPLITUDE: 2.5 V
MDC IDC SET LEADCHNL RV PACING PULSEWIDTH: 0.4 ms
MDC IDC SET LEADCHNL RV SENSING SENSITIVITY: 2 mV
MDC IDC STAT BRADY AS VP PERCENT: 1 %
MDC IDC STAT BRADY AS VS PERCENT: 2 %
Pulse Gen Model: 2210
Pulse Gen Serial Number: 7353349

## 2014-09-01 ENCOUNTER — Encounter: Payer: Self-pay | Admitting: Cardiology

## 2014-09-30 ENCOUNTER — Institutional Professional Consult (permissible substitution): Payer: Medicare Other | Admitting: Internal Medicine

## 2014-11-20 ENCOUNTER — Ambulatory Visit (INDEPENDENT_AMBULATORY_CARE_PROVIDER_SITE_OTHER): Payer: Medicare Other | Admitting: *Deleted

## 2014-11-20 DIAGNOSIS — I495 Sick sinus syndrome: Secondary | ICD-10-CM | POA: Diagnosis not present

## 2014-11-20 NOTE — Progress Notes (Signed)
Remote pacemaker transmission.   

## 2014-11-21 LAB — CUP PACEART REMOTE DEVICE CHECK
Battery Remaining Longevity: 103 mo
Battery Remaining Percentage: 91 %
Battery Voltage: 2.95 V
Brady Statistic AP VP Percent: 1 %
Brady Statistic AS VP Percent: 1 %
Brady Statistic AS VS Percent: 1.6 %
Brady Statistic RV Percent Paced: 2.8 %
Date Time Interrogation Session: 20161006062643
Lead Channel Impedance Value: 410 Ohm
Lead Channel Impedance Value: 540 Ohm
Lead Channel Pacing Threshold Amplitude: 1 V
Lead Channel Pacing Threshold Amplitude: 1.25 V
Lead Channel Pacing Threshold Pulse Width: 0.4 ms
Lead Channel Pacing Threshold Pulse Width: 0.4 ms
Lead Channel Sensing Intrinsic Amplitude: 3.3 mV
Lead Channel Setting Pacing Amplitude: 2 V
Lead Channel Setting Sensing Sensitivity: 2 mV
MDC IDC MSMT LEADCHNL RV SENSING INTR AMPL: 12 mV
MDC IDC SET LEADCHNL RV PACING AMPLITUDE: 2.5 V
MDC IDC SET LEADCHNL RV PACING PULSEWIDTH: 0.4 ms
MDC IDC STAT BRADY AP VS PERCENT: 98 %
MDC IDC STAT BRADY RA PERCENT PACED: 89 %
Pulse Gen Model: 2210
Pulse Gen Serial Number: 7353349

## 2014-11-27 ENCOUNTER — Encounter: Payer: Self-pay | Admitting: Cardiology

## 2014-12-21 ENCOUNTER — Encounter: Payer: Self-pay | Admitting: Cardiovascular Disease

## 2014-12-22 DIAGNOSIS — R350 Frequency of micturition: Secondary | ICD-10-CM | POA: Diagnosis not present

## 2014-12-22 DIAGNOSIS — R351 Nocturia: Secondary | ICD-10-CM | POA: Diagnosis not present

## 2014-12-22 DIAGNOSIS — N320 Bladder-neck obstruction: Secondary | ICD-10-CM | POA: Diagnosis not present

## 2015-01-29 ENCOUNTER — Other Ambulatory Visit: Payer: Self-pay | Admitting: Internal Medicine

## 2015-02-20 ENCOUNTER — Telehealth: Payer: Self-pay | Admitting: Internal Medicine

## 2015-02-20 NOTE — Telephone Encounter (Signed)
Close encounter 

## 2015-02-23 ENCOUNTER — Ambulatory Visit: Payer: Medicare Other | Admitting: Internal Medicine

## 2015-02-24 ENCOUNTER — Encounter: Payer: Self-pay | Admitting: Internal Medicine

## 2015-02-24 ENCOUNTER — Ambulatory Visit (INDEPENDENT_AMBULATORY_CARE_PROVIDER_SITE_OTHER): Payer: Medicare Other | Admitting: Internal Medicine

## 2015-02-24 VITALS — BP 152/82 | HR 63 | Ht 69.0 in | Wt 164.6 lb

## 2015-02-24 DIAGNOSIS — I455 Other specified heart block: Secondary | ICD-10-CM | POA: Diagnosis not present

## 2015-02-24 DIAGNOSIS — I10 Essential (primary) hypertension: Secondary | ICD-10-CM

## 2015-02-24 DIAGNOSIS — I480 Paroxysmal atrial fibrillation: Secondary | ICD-10-CM

## 2015-02-24 DIAGNOSIS — I350 Nonrheumatic aortic (valve) stenosis: Secondary | ICD-10-CM

## 2015-02-24 DIAGNOSIS — R0989 Other specified symptoms and signs involving the circulatory and respiratory systems: Secondary | ICD-10-CM

## 2015-02-24 DIAGNOSIS — Z7901 Long term (current) use of anticoagulants: Secondary | ICD-10-CM

## 2015-02-24 DIAGNOSIS — Z950 Presence of cardiac pacemaker: Secondary | ICD-10-CM | POA: Diagnosis not present

## 2015-02-24 NOTE — Progress Notes (Signed)
OFFICE NOTE  Chief Complaint:  No complaints  Primary Care Physician: Wende Neighbors, MD  HPI:  Cody George is an 82 year old gentleman who has a history of atrial fibrillation and tachy-brady syndrome as well as paroxysmal atrial fibrillation and sinus node arrest. He underwent a St. Jude Accent DR dual-chamber pacemaker in June of 2013. He has done fairly well other than he had some hypotension on lisinopril which was discontinued. He has recently had some tachyarrhythmias and had increased his metoprolol succinate to 25 mg daily due to tachyarrhythmias. He is enrolled in a home monitoring service with the [facility] patient care network. He was successfully transitioned over to Eliquis she is taking without any bleeding difficulty. He denies any shortness of breath or worsening chest pain. He is due for a remote pacer check in a few days.  Cody George returns for follow-up today. He is feeling well denies any chest pain or worsening shortness of breath. He has no palpitations. Blood pressure is well controlled. He had recent bilateral cataract surgery and is seeing much better. There are no bleeding issues.  I saw Cody George back in the office today for follow-up. Tells me a few months ago he had pneumonia and has been very slow to recover. Chest x-ray shows improvement however there are changes consistent with COPD. He has a greater than 65 year history of smoking. I do not believe he carries a diagnosis of COPD and is currently not on any medications for this. He reports some worsening shortness of breath as well has fatigue. He denies any particular chest pain. He does have valvular heart disease and history of aortic stenosis. Recent echo in March shows some worsening of valvular function with now moderate aortic stenosis.  Cody George returns today for follow-up. Overall he is without complaints. He scheduled to see Dr. Loletha George in the office next week for follow-up of his pacemaker. He is  atrially paced today. He had a low burden of atrial fibrillation but remains on Eliquis. He is asymptomatic with this. He's had no bleeding problems on Eliquis. Blood pressure is high normal today 152/82. He says that generally is between [id number] and [id number] systolic at home. I reminded him that his prior echocardiogram showed what may be moderate aortic stenosis. Interestingly, I have not been able to auscultate that on exam. His prior echo was almost a year ago and will be due for repeat echo this year.  PMHx:  Past Medical History  Diagnosis Date  . Hypertension   . Dysrhythmia     PAF  . GERD (gastroesophageal reflux disease)   . Arthritis   . Cellulitis   . Pacemaker 07/22/2011    St. 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 OA (osteoarthritis)   . History of nuclear stress test 10/05/2010    dipyridamole; normal pattern of perfusion in all regions; no significant ishcemia, low risk scan   . Paroxysmal atrial fibrillation   . Prostate cancer     radiation  . Community acquired pneumonia 04/14/2014    Past Surgical History  Procedure Laterality Date  . Hernia repair    . Pacemaker insertion  07/22/2011    St. 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 Transthoracic echocardiogram  10/05/2010    EF=50-55%, normal LV systolic function; LA & RA mildly dilated; mild MR; mild TR with RV systolic pressure elevted at 30-7mmHg; AV mildly sclerotic  with mild calcif of AV leaflets, mild valvular AS, mild-mod regurg; trace pulm valve regurg  . US echocardiography  10/05/10    LA mildly dilated, mild TR, mild ca+ of AOV ,mild to mod. AI  . Nm myocar perf wall motion  10/05/10    normal  . Permanent pacemaker insertion N/A 07/22/2011    Procedure: PERMANENT PACEMAKER INSERTION;  Surgeon: Sanda Klein, MD;  Location: [facility];  Service: Cardiovascular;  Laterality: N/A;    FAMHx:  No family history on file.  SOCHx:    reports that he has been smoking.  He has never used smokeless tobacco. He reports that he does not drink alcohol or use illicit drugs.  ALLERGIES:  No Known Allergies  ROS: A comprehensive review of systems was negative except for: Constitutional: positive for fatigue Respiratory: positive for dyspnea on exertion  HOME MEDS: Current Outpatient Prescriptions  Medication Sig Dispense Refill  . ELIQUIS 5 MG TABS tablet TAKE ONE TABLET TWICE DAILY 60 tablet 5  . ferrous sulfate 325 (65 FE) MG EC tablet Take 325 mg by mouth daily with breakfast.    . metoprolol succinate (TOPROL-XL) 50 MG 24 hr tablet Take 1 tablet (50 mg total) by mouth daily. Take with or immediately following a meal. 90 tablet 3  . NON FORMULARY daily. Cherry Juice - for gout    . pantoprazole (PROTONIX) 40 MG tablet Take 1 tablet by mouth daily.    . tamsulosin (FLOMAX) 0.4 MG CAPS capsule Take 0.4 mg by mouth daily.    . vitamin B-12 (CYANOCOBALAMIN) 1000 MCG tablet Take 1,000 mcg by mouth daily.    Marland Kitchen VITAMIN C, CALCIUM ASCORBATE, PO Take 1,000 mg by mouth daily.     Marland Kitchen VITAMIN D, CHOLECALCIFEROL, PO Take 1,000 mg by mouth daily.      No current facility-administered medications for this visit.    LABS/IMAGING: No results found for this or any previous visit (from the past 48 hour(s)). No results found.  VITALS: BP 152/82 mmHg  Pulse 63  Ht 5\' 9"  (1.753 m)  Wt 164 lb 9.6 oz (74.662 kg)  BMI 24.30 kg/m2  EXAM: General appearance: alert and no distress Neck: no carotid bruit and no JVD Lungs: diminished breath sounds bilaterally and rhonchi bilaterally Heart: regular rate and rhythm, S1, S2 normal and Possibly a faint systolic murmur Abdomen: soft, non-tender; bowel sounds normal; no masses,  no organomegaly Extremities: extremities normal, atraumatic, no cyanosis or edema Pulses: 2+ and symmetric Skin: Skin color, texture, turgor normal. No rashes or lesions Neurologic: Grossly normal Psych: Mood, affect  normal, pleasant  EKG: Atrial paced rhythm at 63  ASSESSMENT: 1. Paroxysmal atrial fibrillation with sinus node arrest 2. Status post St. Jude Accent DR pacemaker 3. Labile hypertension - controlled 4. Chronic anticoagulation on Eliquis 5. Fatigue and dyspnea 6. COPD 7. Aortic stenosis  PLAN: 1.   Cody George seems to be doing much better. His energy level has improved. He's not had a recent exacerbation of COPD. He is having no bleeding combinations with Eliquis. He is due for pacemaker recheck next week with Dr. Loletha George. He does have what looks like moderate aortic stenosis although had low gradients on his last echo. It's very challenging to hear a murmur today. I like to repeat his echo in a few months which will be more than one year since his prior study to see if there's been any interval change in his aortic valve gradient. No change in his medicines  today. Plan to see him back annually or sooner as necessary.  Pixie Casino, MD, [facility] Attending Cardiologist [place] Cody George 02/24/2015, 5:55 PM

## 2015-02-24 NOTE — Patient Instructions (Signed)
Medication Instructions:  Your physician recommends that you continue on your current medications as directed. Please refer to the Current Medication list given to you today.   Labwork: none  Testing/Procedures: Your physician has requested that you have an echocardiogram. Echocardiography is a painless test that uses sound waves to create images of your heart. It provides your doctor with information about the size and shape of your heart and how well your heart's chambers and valves are working. This procedure takes approximately one hour. There are no restrictions for this procedure. SCHEDULED AFTER 04/15/2015 AT [facility]    Follow-Up: Your physician wants you to follow-up in: 12 months with Dr. Debara Pickett. You will receive a reminder letter in the mail two months in advance. If you don't receive a letter, please call our office to schedule the follow-up appointment.   Any Other Special Instructions Will Be Listed Below (If Applicable).     If you need a refill on your cardiac medications before your next appointment, please call your pharmacy.

## 2015-03-03 ENCOUNTER — Ambulatory Visit (INDEPENDENT_AMBULATORY_CARE_PROVIDER_SITE_OTHER): Payer: Medicare Other | Admitting: Cardiovascular Disease

## 2015-03-03 ENCOUNTER — Encounter: Payer: Self-pay | Admitting: Cardiovascular Disease

## 2015-03-03 VITALS — BP 118/68 | HR 68 | Ht 69.0 in | Wt 163.1 lb

## 2015-03-03 DIAGNOSIS — I455 Other specified heart block: Secondary | ICD-10-CM

## 2015-03-03 DIAGNOSIS — I480 Paroxysmal atrial fibrillation: Secondary | ICD-10-CM | POA: Diagnosis not present

## 2015-03-03 DIAGNOSIS — Z950 Presence of cardiac pacemaker: Secondary | ICD-10-CM | POA: Diagnosis not present

## 2015-03-03 NOTE — Patient Instructions (Signed)
Remote monitoring is used to monitor your Pacemaker from home. This monitoring reduces the number of office visits required to check your device to one time per year. It allows [place] to monitor the functioning of your device to ensure it is working properly. You are scheduled for a device check from home on June 02, 2015. You may send your transmission at any time that day. If you have a wireless device, the transmission will be sent automatically. After your physician reviews your transmission, you will receive a postcard with your next transmission date.  Dr. Sallyanne Kuster recommends that you schedule a follow-up appointment in: [place] ([place]).

## 2015-03-03 NOTE — Progress Notes (Signed)
Patient ID: Cody George, male   DOB: 04-Apr-1930, 82 y.o.   MRN: [id number]    Cardiology Office Note    Date:  03/04/2015   ID:  Cody George, DOB 1930-04-30, MRN [id number]  PCP:  Wende Neighbors, MD  Cardiologist:  Raliegh Ip. [name] HILTY, MD; Sanda Klein, MD   Chief Complaint  Patient presents with  . Follow-up     no chest pain, shortness of breath-old age, no edema, no pain or cramping in legs, no lightheadedness or dizziness    History of Present Illness:  Cody George is a 82 y.o. male with a history of paroxysmal atrial fibrillation and sinus node arrest who received a dual-chamber pacemaker in 2013. He returns for routine follow-up and has no cardiac complaints. He specifically denies any palpitations and is unaware of the fact that he is in atrial fibrillation at the time of his appointment. The arrhythmia began early this morning and has been going on for several hours. His overall burden of atrial fibrillation is around 7% which is not much different from historical trend. Ventricular rate control is uniformly excellent. Typically his ventricular rate is in the 80-90 bpm range while in atrial fibrillation. He has 88% atrial pacing and only 3% ventricular pacing. His device has an expected generator longevity of another 8.9 years.  Occasional atrial undersensing is noted during his interrogation today and appropriate changes were made to device sensitivity.  He has not had any bleeding complications while on treatment with Eliquis.    Past Medical History  Diagnosis Date  . Hypertension   . Dysrhythmia     PAF  . GERD (gastroesophageal reflux disease)   . Arthritis   . Cellulitis   . Pacemaker 07/22/2011    St. 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 OA (osteoarthritis)   . History of nuclear stress test 10/05/2010    dipyridamole; normal pattern of perfusion in all regions; no significant ishcemia, low risk scan   . Paroxysmal  atrial fibrillation (HCC)   . Prostate cancer ([place])     radiation  . Community acquired pneumonia 04/14/2014    Past Surgical History  Procedure Laterality Date  . Hernia repair    . Pacemaker insertion  07/22/2011    St. 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 Transthoracic echocardiogram  10/05/2010    EF=50-55%, normal LV systolic function; LA & RA mildly dilated; mild MR; mild TR with RV systolic pressure elevted at 30-35mmHg; AV mildly sclerotic with mild calcif of AV leaflets, mild valvular AS, mild-mod regurg; trace pulm valve regurg  . US echocardiography  10/05/10    LA mildly dilated, mild TR, mild ca+ of AOV ,mild to mod. AI  . Nm myocar perf wall motion  10/05/10    normal  . Permanent pacemaker insertion N/A 07/22/2011    Procedure: PERMANENT PACEMAKER INSERTION;  Surgeon: Sanda Klein, MD;  Location: [facility];  Service: Cardiovascular;  Laterality: N/A;    Outpatient Prescriptions Prior to Visit  Medication Sig Dispense Refill  . ELIQUIS 5 MG TABS tablet TAKE ONE TABLET TWICE DAILY 60 tablet 5  . ferrous sulfate 325 (65 FE) MG EC tablet Take 325 mg by mouth daily with breakfast.    . metoprolol succinate (TOPROL-XL) 50 MG 24 hr tablet Take 1 tablet (50 mg total) by mouth daily. Take with or immediately following a meal. 90 tablet 3  . NON  FORMULARY daily. Cherry Juice - for gout    . pantoprazole (PROTONIX) 40 MG tablet Take 1 tablet by mouth daily.    . tamsulosin (FLOMAX) 0.4 MG CAPS capsule Take 0.4 mg by mouth daily.    . vitamin B-12 (CYANOCOBALAMIN) 1000 MCG tablet Take 1,000 mcg by mouth daily.    Marland Kitchen VITAMIN C, CALCIUM ASCORBATE, PO Take 1,000 mg by mouth daily.     Marland Kitchen VITAMIN D, CHOLECALCIFEROL, PO Take 1,000 mg by mouth daily.      No facility-administered medications prior to visit.     Allergies:   Review of patient's allergies indicates no known allergies.   Social History   Social History  . Marital Status:  Married    Spouse Name: N/A  . Number of Children: 5  . Years of Education: N/A   Occupational History  .     Social History Main Topics  . Smoking status: Current Every Day Smoker -- 1.00 packs/day for 65 years  . Smokeless tobacco: Never Used     Comment: now 1/2 ppd (07/09/14)  . Alcohol Use: No  . Drug Use: No  . Sexual Activity: Not Currently   Other Topics Concern  . None   Social History Narrative       ROS:   Please see the history of present illness.    ROS All other systems reviewed and are negative.   PHYSICAL EXAM:   VS:  BP 118/68 mmHg  Pulse 68  Ht 5\' 9"  (1.753 m)  Wt 163 lb 1 oz (73.965 kg)  BMI 24.07 kg/m2   GEN: Well nourished, well developed, in no acute distress HEENT: normal Neck: no JVD, carotid bruits, or masses Cardiac: irregular; no murmurs, rubs, or gallops,no edema  Respiratory:  clear to auscultation bilaterally, normal work of breathing GI: soft, nontender, nondistended, + BS MS: no deformity or atrophy Skin: warm and dry, no rash Neuro:  Alert and Oriented x 3, Strength and sensation are intact Psych: euthymic mood, full affect  Wt Readings from Last 3 Encounters:  03/03/15 163 lb 1 oz (73.965 kg)  02/24/15 164 lb 9.6 oz (74.662 kg)  07/25/14 164 lb (74.39 kg)      Studies/Labs Reviewed:   EKG:  EKG is not ordered today.   Recent Labs: 04/15/2014: ALT 43; BUN 14; Creatinine, Ser 0.60; Hemoglobin 11.3*; Platelets 438*; Potassium 3.5; Sodium 132*   Lipid Panel    Component Value Date/Time   CHOL 131 12/21/2012 0820   TRIG 68 12/21/2012 0820   HDL 53 12/21/2012 0820   CHOLHDL 2.5 12/21/2012 0820   VLDL 14 12/21/2012 0820   LDLCALC 64 12/21/2012 0820      ASSESSMENT:    1. Paroxysmal a-fib (HCC)   2. Sinus arrest   3. Pacemaker - St Jude Accent DR RF June 2013, sinus node arrest      PLAN:  In order of problems listed above:  1. AFIB: Asymptomatic, adequate rate control, on appropriate anticoagulants for  bleeding or embolic complications 2. SSS: pacemaker implanted 2013 3. remote pacemaker follow-up via the [place] system every 3 months, yearly office visits. Minor reprogramming changes (reduction in the level of atrial sensitivity) made today to avoid atrial under sensing during atrial fibrillation.    Medication Adjustments/Labs and Tests Ordered: Current medicines are reviewed at length with the patient today.  Concerns regarding medicines are outlined above.  Medication changes, Labs and Tests ordered today are listed in the Patient Instructions below. Patient Instructions  Remote monitoring is used to monitor your Pacemaker from home. This monitoring reduces the number of office visits required to check your device to one time per year. It allows [place] to monitor the functioning of your device to ensure it is working properly. You are scheduled for a device check from home on June 02, 2015. You may send your transmission at any time that day. If you have a wireless device, the transmission will be sent automatically. After your physician reviews your transmission, you will receive a postcard with your next transmission date.  Dr. Sallyanne Kuster recommends that you schedule a follow-up appointment in: [place] ([place]).         Mikael Spray, MD  03/04/2015 7:02 PM    [facility] [address], [place], [place]  [address] Phone: [phone]; Fax: [phone]

## 2015-03-17 DIAGNOSIS — R972 Elevated prostate specific antigen [PSA]: Secondary | ICD-10-CM | POA: Diagnosis not present

## 2015-03-24 ENCOUNTER — Ambulatory Visit (INDEPENDENT_AMBULATORY_CARE_PROVIDER_SITE_OTHER): Payer: Medicare Other | Admitting: Urology

## 2015-03-24 DIAGNOSIS — N320 Bladder-neck obstruction: Secondary | ICD-10-CM | POA: Diagnosis not present

## 2015-03-24 DIAGNOSIS — C61 Malignant neoplasm of prostate: Secondary | ICD-10-CM | POA: Diagnosis not present

## 2015-03-24 DIAGNOSIS — R3129 Other microscopic hematuria: Secondary | ICD-10-CM | POA: Diagnosis not present

## 2015-04-16 ENCOUNTER — Ambulatory Visit (HOSPITAL_COMMUNITY)
Admission: RE | Admit: 2015-04-16 | Discharge: 2015-04-16 | Disposition: A | Discharge status: Home / Self Care | Payer: Medicare Other | Source: Ambulatory Visit | Attending: Internal Medicine | Admitting: Internal Medicine

## 2015-04-16 ENCOUNTER — Other Ambulatory Visit (HOSPITAL_COMMUNITY): Payer: Medicare Other

## 2015-04-16 DIAGNOSIS — I352 Nonrheumatic aortic (valve) stenosis with insufficiency: Secondary | ICD-10-CM | POA: Diagnosis not present

## 2015-04-16 DIAGNOSIS — I517 Cardiomegaly: Secondary | ICD-10-CM | POA: Insufficient documentation

## 2015-04-16 DIAGNOSIS — I350 Nonrheumatic aortic (valve) stenosis: Secondary | ICD-10-CM

## 2015-04-16 DIAGNOSIS — I340 Nonrheumatic mitral (valve) insufficiency: Secondary | ICD-10-CM | POA: Insufficient documentation

## 2015-04-21 ENCOUNTER — Other Ambulatory Visit: Payer: Self-pay | Admitting: Cardiovascular Disease

## 2015-04-21 DIAGNOSIS — K4090 Unilateral inguinal hernia, without obstruction or gangrene, not specified as recurrent: Secondary | ICD-10-CM | POA: Diagnosis not present

## 2015-04-22 NOTE — Telephone Encounter (Signed)
REFILL 

## 2015-05-21 LAB — CUP PACEART INCLINIC DEVICE CHECK
Date Time Interrogation Session: 20170406113732
Implantable Lead Implant Date: 20130607
Implantable Lead Implant Date: 20130607
Implantable Lead Location: 753860
MDC IDC LEAD LOCATION: 753859
MDC IDC PG SERIAL: 7353349

## 2015-06-02 ENCOUNTER — Telehealth: Payer: Self-pay | Admitting: Cardiology

## 2015-06-02 ENCOUNTER — Encounter: Payer: Medicare Other | Admitting: *Deleted

## 2015-06-02 DIAGNOSIS — K4090 Unilateral inguinal hernia, without obstruction or gangrene, not specified as recurrent: Secondary | ICD-10-CM | POA: Diagnosis not present

## 2015-06-02 NOTE — Telephone Encounter (Signed)
LMOVM reminding pt to send remote transmission.   

## 2015-06-03 NOTE — Patient Instructions (Signed)
Cody George  06/03/2015     @PREFPERIOPPHARMACY @   Your procedure is scheduled on 06/10/2015.  Report to [facility] at 7:00 A.M.  Call this number if you have problems the morning of surgery:  [phone]   Remember:  Do not eat food or drink liquids after midnight.  Take these medicines the morning of surgery with A SIP OF WATER Toprol, flomax   Do not wear jewelry, make-up or nail polish.  Do not wear lotions, powders, or perfumes.  You may wear deodorant.  Do not shave 48 hours prior to surgery.  Men may shave face and neck.  Do not bring valuables to the hospital.  [facility] is not responsible for any belongings or valuables.  Contacts, dentures or bridgework may not be worn into surgery.  Leave your suitcase in the car.  After surgery it may be brought to your room.  For patients admitted to the hospital, discharge time will be determined by your treatment team.  Patients discharged the day of surgery will not be allowed to drive home.    Please read over the following fact sheets that you were given. Surgical Site Infection Prevention and Anesthesia Post-op Instructions     PATIENT INSTRUCTIONS POST-ANESTHESIA  IMMEDIATELY FOLLOWING SURGERY:  Do not drive or operate machinery for the first twenty four hours after surgery.  Do not make any important decisions for twenty four hours after surgery or while taking narcotic pain medications or sedatives.  If you develop intractable nausea and vomiting or a severe headache please notify your doctor immediately.  FOLLOW-UP:  Please make an appointment with your surgeon as instructed. You do not need to follow up with anesthesia unless specifically instructed to do so.  WOUND CARE INSTRUCTIONS (if applicable):  Keep a dry clean dressing on the anesthesia/puncture wound site if there is drainage.  Once the wound has quit draining you may leave it open to air.  Generally you should leave the bandage intact for twenty  four hours unless there is drainage.  If the epidural site drains for more than 36-48 hours please call the anesthesia department.  QUESTIONS?:  Please feel free to call your physician or the hospital operator if you have any questions, and they will be happy to assist you.      Hernia, Adult A hernia is the bulging of an organ or tissue through a weak spot in the muscles of the abdomen (abdominal wall). Hernias develop most often near the navel or groin. There are many kinds of hernias. Common kinds include:  Femoral hernia. This kind of hernia develops under the groin in the upper thigh area.  Inguinal hernia. This kind of hernia develops in the groin or scrotum.  Umbilical hernia. This kind of hernia develops near the navel.  Hiatal hernia. This kind of hernia causes part of the stomach to be pushed up into the chest.  Incisional hernia. This kind of hernia bulges through a scar from an abdominal surgery. CAUSES This condition may be caused by:  Heavy lifting.  Coughing over a long period of time.  Straining to have a bowel movement.  An incision made during an abdominal surgery.  A birth defect (congenital defect).  Excess weight or obesity.  Smoking.  Poor nutrition.  Cystic fibrosis.  Excess fluid in the abdomen.  Undescended testicles. SYMPTOMS Symptoms of a hernia include:  A lump on the abdomen. This is the first sign of a hernia. The lump may become  more obvious with standing, straining, or coughing. It may get bigger over time if it is not treated or if the condition causing it is not treated.  Pain. A hernia is usually painless, but it may become painful over time if treatment is delayed. The pain is usually dull and may get worse with standing or lifting heavy objects. Sometimes a hernia gets tightly squeezed in the weak spot (strangulated) or stuck there (incarcerated) and causes additional symptoms. These symptoms may  include:  Vomiting.  Nausea.  Constipation.  Irritability. DIAGNOSIS A hernia may be diagnosed with:  A physical exam. During the exam your health care provider may ask you to cough or to make a specific movement, because a hernia is usually more visible when you move.  Imaging tests. These can include:  X-rays.  Ultrasound.  CT scan. TREATMENT A hernia that is small and painless may not need to be treated. A hernia that is large or painful may be treated with surgery. Inguinal hernias may be treated with surgery to prevent incarceration or strangulation. Strangulated hernias are always treated with surgery, because lack of blood to the trapped organ or tissue can cause it to die. Surgery to treat a hernia involves pushing the bulge back into place and repairing the weak part of the abdomen. HOME CARE INSTRUCTIONS  Avoid straining.  Do not lift anything heavier than 10 lb (4.5 kg).  Lift with your leg muscles, not your back muscles. This helps avoid strain.  When coughing, try to cough gently.  Prevent constipation. Constipation leads to straining with bowel movements, which can make a hernia worse or cause a hernia repair to break down. You can prevent constipation by:  Eating a high-fiber diet that includes plenty of fruits and vegetables.  Drinking enough fluids to keep your urine clear or pale yellow. Aim to drink 6-8 glasses of water per day.  Using a stool softener as directed by your health care provider.  Lose weight, if you are overweight.  Do not use any tobacco products, including cigarettes, chewing tobacco, or electronic cigarettes. If you need help quitting, ask your health care provider.  Keep all follow-up visits as directed by your health care provider. This is important. Your health care provider may need to monitor your condition. SEEK MEDICAL CARE IF:  You have swelling, redness, and pain in the affected area.  Your bowel habits change. SEEK  IMMEDIATE MEDICAL CARE IF:  You have a fever.  You have abdominal pain that is getting worse.  You feel nauseous or you vomit.  You cannot push the hernia back in place by gently pressing on it while you are lying down.  The hernia:  Changes in shape or size.  Is stuck outside the abdomen.  Becomes discolored.  Feels hard or tender.   This information is not intended to replace advice given to you by your health care provider. Make sure you discuss any questions you have with your health care provider.   Document Released: 01/31/2005 Document Revised: 02/21/2014 Document Reviewed: 12/11/2013 Elsevier Interactive Patient Education [employer].

## 2015-06-03 NOTE — H&P (Signed)
  NTS SOAP Note  Vital Signs:  Vitals as of: XX123456: Systolic XX123456: Diastolic 84: Heart Rate 61: Temp 79F (Temporal): Height 50ft 9in: Weight 165Lbs 0 Ounces: Pain Level 3: BMI 24.37   BMI : 24.37 kg/m2  Subjective: This 82 year old male presents for of a left inguinal hernia.  Has been present for some time now.  Reduces on its own.  Is not painful, does not disturb his own routine.  No nausea, vomiting.  Has had left inguinal repair in the remote past.  Review of Symptoms:  Constitutional:unremarkable   Head:unremarkable Eyes:unremarkable   Nose/Mouth/Throat:unremarkable Cardiovascular:  unremarkable Respiratory:cough Gastrointestinal:  unremarkable   Genitourinary:unremarkable   Musculoskeletal:unremarkable Skin:unremarkable Hematolgic/Lymphatic:unremarkable   Allergic/Immunologic:unremarkable   Past Medical History:  Reviewed  Past Medical History  Surgical History: inguinal surgeries in remote past Medical Problems: , HTN, prostate cancer, arthritis Allergies: nkda Medications: eliquis, metoprolol, tamsulosin, pantoprazole   Social History:Reviewed  Social History  Preferred Language: English Race:  White Ethnicity: Not Hispanic / Latino Age: 15 year Marital Status:  S Alcohol: no   Smoking Status: Light tobacco smoker reviewed on 04/21/2015 Started Date:  Packs per week:  Functional Status reviewed on 04/21/2015 ------------------------------------------------ Bathing: Normal Cooking: Normal Dressing: Normal Driving: Normal Eating: Normal Managing Meds: Normal Oral Care: Normal Shopping: Normal Toileting: Normal Transferring: Normal Walking: Normal Cognitive Status reviewed on 04/21/2015 ------------------------------------------------ Attention: Normal Decision Making: Normal Language: Normal Memory: Normal Motor: Normal Perception: Normal Problem Solving: Normal Visual and Spatial: Normal   Family  History:Reviewed  Family Health History Family History is Unknown    Objective Information: General:Well appearing, well nourished in no distress. Heart:RRR, no murmur or gallop.  Normal S1, S2.  No S3, S4.  Lungs:  CTA bilaterally, no wheezes, rhonchi, rales.  Breathing unlabored. Abdomen:Soft, NT/ND, no HSM, no masses.  Reducible left inguinal hernia, no right inguinal hernia  Assessment:Left inguinal hernia  Diagnoses: 550.90  K40.90 Inguinal hernia (Unilateral inguinal hernia, without obstruction or gangrene, not specified as recurrent)  Procedures: [id number] - OFFICE OUTPATIENT NEW 20 MINUTES    Plan:  Discussed extensively with patient.  Scheduled for left inguinal herniorrhaphy with mesh on 06/10/2015. The risks and benefits of the procedure including bleeding, infection, mesh use, and the possibility of recurrence of the hernia were fully explained to the patient, who gave informed consent. The patient will stop his Eliquis 3 days prior to the procedure.  Patient Education:Alternative treatments to surgery were discussed with patient (and family).

## 2015-06-04 ENCOUNTER — Encounter (HOSPITAL_COMMUNITY): Payer: Self-pay

## 2015-06-04 ENCOUNTER — Encounter (HOSPITAL_COMMUNITY)
Admission: RE | Admit: 2015-06-04 | Discharge: 2015-06-04 | Disposition: A | Discharge status: Home / Self Care | Payer: Medicare Other | Source: Ambulatory Visit | Attending: General Surgery | Admitting: General Surgery

## 2015-06-04 DIAGNOSIS — Z01812 Encounter for preprocedural laboratory examination: Secondary | ICD-10-CM | POA: Diagnosis not present

## 2015-06-04 LAB — CBC WITH DIFFERENTIAL/PLATELET
BASOS PCT: 0 %
Basophils Absolute: 0 10*3/uL (ref 0.0–0.1)
EOS ABS: 0.1 10*3/uL (ref 0.0–0.7)
EOS PCT: 1 %
HCT: 38.5 % — ABNORMAL LOW (ref 39.0–52.0)
Hemoglobin: 13.1 g/dL (ref 13.0–17.0)
Lymphocytes Relative: 10 %
Lymphs Abs: 0.7 10*3/uL (ref 0.7–4.0)
MCH: 32 pg (ref 26.0–34.0)
MCHC: 34 g/dL (ref 30.0–36.0)
MCV: 93.9 fL (ref 78.0–100.0)
Monocytes Absolute: 0.9 10*3/uL (ref 0.1–1.0)
Monocytes Relative: 12 %
NEUTROS PCT: 77 %
Neutro Abs: 5.9 10*3/uL (ref 1.7–7.7)
Platelets: 236 10*3/uL (ref 150–400)
RBC: 4.1 MIL/uL — ABNORMAL LOW (ref 4.22–5.81)
RDW: 14.3 % (ref 11.5–15.5)
WBC: 7.6 10*3/uL (ref 4.0–10.5)

## 2015-06-04 LAB — BASIC METABOLIC PANEL
Anion gap: 7 (ref 5–15)
BUN: 17 mg/dL (ref 6–20)
CALCIUM: 9.1 mg/dL (ref 8.9–10.3)
CO2: 27 mmol/L (ref 22–32)
Chloride: 99 mmol/L — ABNORMAL LOW (ref 101–111)
Creatinine, Ser: 0.87 mg/dL (ref 0.61–1.24)
Glucose, Bld: 94 mg/dL (ref 65–99)
Potassium: 4.4 mmol/L (ref 3.5–5.1)
Sodium: 133 mmol/L — ABNORMAL LOW (ref 135–145)

## 2015-06-04 NOTE — Pre-Procedure Instructions (Signed)
Patient given inforamtion to sign up for my chart at home. 

## 2015-06-05 ENCOUNTER — Encounter: Payer: Self-pay | Admitting: Cardiology

## 2015-06-08 MED ORDER — PROPOFOL 10 MG/ML IV BOLUS
INTRAVENOUS | Status: AC
  Filled 2015-06-08: qty 40

## 2015-06-10 ENCOUNTER — Ambulatory Visit (HOSPITAL_COMMUNITY)
Admission: RE | Admit: 2015-06-10 | Discharge: 2015-06-10 | Disposition: A | Discharge status: Home / Self Care | Payer: Medicare Other | Source: Ambulatory Visit | Attending: General Surgery | Admitting: General Surgery

## 2015-06-10 ENCOUNTER — Encounter (HOSPITAL_COMMUNITY)
Admission: RE | Disposition: A | Discharge status: Home / Self Care | Payer: Self-pay | Source: Ambulatory Visit | Attending: General Surgery

## 2015-06-10 ENCOUNTER — Encounter (HOSPITAL_COMMUNITY): Payer: Self-pay | Admitting: *Deleted

## 2015-06-10 ENCOUNTER — Ambulatory Visit (HOSPITAL_COMMUNITY): Payer: Medicare Other | Admitting: Anesthesiology

## 2015-06-10 DIAGNOSIS — F17200 Nicotine dependence, unspecified, uncomplicated: Secondary | ICD-10-CM | POA: Diagnosis not present

## 2015-06-10 DIAGNOSIS — I10 Essential (primary) hypertension: Secondary | ICD-10-CM | POA: Diagnosis not present

## 2015-06-10 DIAGNOSIS — J449 Chronic obstructive pulmonary disease, unspecified: Secondary | ICD-10-CM | POA: Insufficient documentation

## 2015-06-10 DIAGNOSIS — M1991 Primary osteoarthritis, unspecified site: Secondary | ICD-10-CM | POA: Insufficient documentation

## 2015-06-10 DIAGNOSIS — K4091 Unilateral inguinal hernia, without obstruction or gangrene, recurrent: Secondary | ICD-10-CM | POA: Insufficient documentation

## 2015-06-10 DIAGNOSIS — K219 Gastro-esophageal reflux disease without esophagitis: Secondary | ICD-10-CM | POA: Insufficient documentation

## 2015-06-10 DIAGNOSIS — Z79899 Other long term (current) drug therapy: Secondary | ICD-10-CM | POA: Insufficient documentation

## 2015-06-10 DIAGNOSIS — Z950 Presence of cardiac pacemaker: Secondary | ICD-10-CM | POA: Insufficient documentation

## 2015-06-10 DIAGNOSIS — I4891 Unspecified atrial fibrillation: Secondary | ICD-10-CM | POA: Insufficient documentation

## 2015-06-10 DIAGNOSIS — K4090 Unilateral inguinal hernia, without obstruction or gangrene, not specified as recurrent: Secondary | ICD-10-CM | POA: Diagnosis not present

## 2015-06-10 DIAGNOSIS — R0902 Hypoxemia: Secondary | ICD-10-CM | POA: Diagnosis not present

## 2015-06-10 HISTORY — PX: INGUINAL HERNIA REPAIR: SHX194

## 2015-06-10 SURGERY — REPAIR, HERNIA, INGUINAL, ADULT
Anesthesia: Spinal | Site: Groin | Laterality: Left

## 2015-06-10 MED ORDER — BUPIVACAINE LIPOSOME 1.3 % IJ SUSP
INTRAMUSCULAR | Status: DC | PRN
  Administered 2015-06-10: 20 mL

## 2015-06-10 MED ORDER — SODIUM CHLORIDE 0.9 % IR SOLN
Status: DC | PRN
  Administered 2015-06-10: 1000 mL

## 2015-06-10 MED ORDER — ONDANSETRON HCL 4 MG/2ML IJ SOLN: 4.0000 mg | Freq: Once | INTRAMUSCULAR | Status: DC | PRN

## 2015-06-10 MED ORDER — MIDAZOLAM HCL 5 MG/5ML IJ SOLN
INTRAMUSCULAR | Status: DC | PRN
  Administered 2015-06-10 (×2): 0.5 mg via INTRAVENOUS

## 2015-06-10 MED ORDER — CEFAZOLIN SODIUM-DEXTROSE 2-4 GM/100ML-% IV SOLN
2.0000 g | INTRAVENOUS | Status: AC
  Administered 2015-06-10: 2 g via INTRAVENOUS
  Filled 2015-06-10: qty 100

## 2015-06-10 MED ORDER — HYDROCODONE-ACETAMINOPHEN 5-325 MG PO TABS: 1.0000 | ORAL_TABLET | Freq: Four times a day (QID) | ORAL | Status: DC | PRN

## 2015-06-10 MED ORDER — BUPIVACAINE IN DEXTROSE 0.75-8.25 % IT SOLN
INTRATHECAL | Status: DC | PRN
  Administered 2015-06-10: 15 mg via INTRATHECAL

## 2015-06-10 MED ORDER — KETOROLAC TROMETHAMINE 30 MG/ML IJ SOLN
30.0000 mg | Freq: Once | INTRAMUSCULAR | Status: AC
  Administered 2015-06-10: 30 mg via INTRAVENOUS

## 2015-06-10 MED ORDER — CHLORHEXIDINE GLUCONATE 4 % EX LIQD: 1.0000 "application " | Freq: Once | CUTANEOUS | Status: DC

## 2015-06-10 MED ORDER — BUPIVACAINE IN DEXTROSE 0.75-8.25 % IT SOLN
INTRATHECAL | Status: AC
  Filled 2015-06-10: qty 2

## 2015-06-10 MED ORDER — PROPOFOL 500 MG/50ML IV EMUL
INTRAVENOUS | Status: DC | PRN
  Administered 2015-06-10: 25 ug/kg/min via INTRAVENOUS

## 2015-06-10 MED ORDER — FENTANYL CITRATE (PF) 100 MCG/2ML IJ SOLN
INTRAMUSCULAR | Status: DC | PRN
  Administered 2015-06-10: 25 ug via INTRATHECAL

## 2015-06-10 MED ORDER — MIDAZOLAM HCL 2 MG/2ML IJ SOLN
INTRAMUSCULAR | Status: AC
  Filled 2015-06-10: qty 2

## 2015-06-10 MED ORDER — PHENYLEPHRINE HCL 10 MG/ML IJ SOLN
INTRAMUSCULAR | Status: DC | PRN
  Administered 2015-06-10: 40 ug via INTRAVENOUS

## 2015-06-10 MED ORDER — PHENYLEPHRINE 40 MCG/ML (10ML) SYRINGE FOR IV PUSH (FOR BLOOD PRESSURE SUPPORT)
PREFILLED_SYRINGE | INTRAVENOUS | Status: AC
  Filled 2015-06-10: qty 10

## 2015-06-10 MED ORDER — LACTATED RINGERS IV SOLN
INTRAVENOUS | Status: DC
  Administered 2015-06-10: 1000 mL via INTRAVENOUS

## 2015-06-10 MED ORDER — BUPIVACAINE LIPOSOME 1.3 % IJ SUSP
INTRAMUSCULAR | Status: AC
  Filled 2015-06-10: qty 20

## 2015-06-10 MED ORDER — FENTANYL CITRATE (PF) 100 MCG/2ML IJ SOLN: 25.0000 ug | INTRAMUSCULAR | Status: DC | PRN

## 2015-06-10 MED ORDER — MIDAZOLAM HCL 2 MG/2ML IJ SOLN
1.0000 mg | INTRAMUSCULAR | Status: DC | PRN
  Administered 2015-06-10: 2 mg via INTRAVENOUS

## 2015-06-10 MED ORDER — FENTANYL CITRATE (PF) 100 MCG/2ML IJ SOLN
INTRAMUSCULAR | Status: AC
  Filled 2015-06-10: qty 2

## 2015-06-10 MED ORDER — KETOROLAC TROMETHAMINE 30 MG/ML IJ SOLN
INTRAMUSCULAR | Status: AC
  Filled 2015-06-10: qty 1

## 2015-06-10 MED ORDER — EPHEDRINE SULFATE 50 MG/ML IJ SOLN
INTRAMUSCULAR | Status: DC | PRN
  Administered 2015-06-10: 10 mg via INTRAVENOUS
  Administered 2015-06-10: 5 mg via INTRAVENOUS
  Administered 2015-06-10: 10 mg via INTRAVENOUS

## 2015-06-10 MED ORDER — PROPOFOL 10 MG/ML IV BOLUS
INTRAVENOUS | Status: AC
  Filled 2015-06-10: qty 20

## 2015-06-10 SURGICAL SUPPLY — 43 items
ADH SKN CLS APL DERMABOND .7 (GAUZE/BANDAGES/DRESSINGS) ×1
BAG HAMPER (MISCELLANEOUS) ×3 IMPLANT
CLOTH BEACON ORANGE TIMEOUT ST (SAFETY) ×3 IMPLANT
COVER LIGHT HANDLE STERIS (MISCELLANEOUS) ×6 IMPLANT
DERMABOND ADVANCED (GAUZE/BANDAGES/DRESSINGS) ×2
DERMABOND ADVANCED .7 DNX12 (GAUZE/BANDAGES/DRESSINGS) ×1 IMPLANT
DRAIN PENROSE 18X1/2 LTX STRL (DRAIN) ×3 IMPLANT
ELECT REM PT RETURN 9FT ADLT (ELECTROSURGICAL) ×3
ELECTRODE REM PT RTRN 9FT ADLT (ELECTROSURGICAL) ×1 IMPLANT
GLOVE BIOGEL PI IND STRL 7.0 (GLOVE) ×1 IMPLANT
GLOVE BIOGEL PI IND STRL 7.5 (GLOVE) IMPLANT
GLOVE BIOGEL PI INDICATOR 7.0 (GLOVE) ×4
GLOVE BIOGEL PI INDICATOR 7.5 (GLOVE) ×2
GLOVE ECLIPSE 6.5 STRL STRAW (GLOVE) ×2 IMPLANT
GLOVE SURG SS PI 7.5 STRL IVOR (GLOVE) ×3 IMPLANT
GOWN STRL REUS W/ TWL XL LVL3 (GOWN DISPOSABLE) ×1 IMPLANT
GOWN STRL REUS W/TWL LRG LVL3 (GOWN DISPOSABLE) ×6 IMPLANT
GOWN STRL REUS W/TWL XL LVL3 (GOWN DISPOSABLE) ×3
INST SET MINOR GENERAL (KITS) ×3 IMPLANT
KIT ROOM TURNOVER APOR (KITS) ×3 IMPLANT
MANIFOLD NEPTUNE II (INSTRUMENTS) ×3 IMPLANT
MESH HERNIA 1.6X1.9 PLUG LRG (Mesh General) IMPLANT
MESH HERNIA PLUG LRG (Mesh General) ×2 IMPLANT
NDL HYPO 18GX1.5 BLUNT FILL (NEEDLE) IMPLANT
NDL HYPO 21X1.5 SAFETY (NEEDLE) ×1 IMPLANT
NEEDLE HYPO 18GX1.5 BLUNT FILL (NEEDLE) ×3 IMPLANT
NEEDLE HYPO 21X1.5 SAFETY (NEEDLE) ×3 IMPLANT
NS IRRIG 1000ML POUR BTL (IV SOLUTION) ×3 IMPLANT
PACK MINOR (CUSTOM PROCEDURE TRAY) ×3 IMPLANT
PAD ARMBOARD 7.5X6 YLW CONV (MISCELLANEOUS) ×3 IMPLANT
SCRUB PCMX 4 OZ (MISCELLANEOUS) ×3 IMPLANT
SET BASIN LINEN APH (SET/KITS/TRAYS/PACK) ×3 IMPLANT
SUT NOVA NAB GS-22 2 2-0 T-19 (SUTURE) ×6 IMPLANT
SUT PROLENE 2 0 SH 30 (SUTURE) IMPLANT
SUT SILK 3 0 (SUTURE)
SUT SILK 3-0 18XBRD TIE 12 (SUTURE) IMPLANT
SUT VIC AB 2-0 CT1 27 (SUTURE) ×3
SUT VIC AB 2-0 CT1 TAPERPNT 27 (SUTURE) ×1 IMPLANT
SUT VIC AB 3-0 SH 27 (SUTURE) ×3
SUT VIC AB 3-0 SH 27X BRD (SUTURE) ×1 IMPLANT
SUT VIC AB 4-0 PS2 27 (SUTURE) ×3 IMPLANT
SUT VICRYL AB 3 0 TIES (SUTURE) IMPLANT
SYR 20CC LL (SYRINGE) ×3 IMPLANT

## 2015-06-10 NOTE — Interval H&P Note (Signed)
History and Physical Interval Note:  06/10/2015 7:20 AM  Cody George  has presented today for surgery, with the diagnosis of left inguinal hernia  The various methods of treatment have been discussed with the patient and family. After consideration of risks, benefits and other options for treatment, the patient has consented to  Procedure(s) with comments: [place] (Left) - Pt notified to arrive at 6:15am KF as a surgical intervention .  The patient's history has been reviewed, patient examined, no change in status, stable for surgery.  I have reviewed the patient's chart and labs.  Questions were answered to the patient's satisfaction.     Aviva Signs A

## 2015-06-10 NOTE — Op Note (Signed)
Patient:  Cody George  DOB:  1930-04-08  MRN:  [id number]   Preop Diagnosis:  Left inguinal hernia  Postop Diagnosis:  Same, recurrent  Procedure:  Recurrent left inguinal herniorrhaphy with mesh  Surgeon:  Aviva Signs, M.D.  Anes:  Spinal  Indications:  Patient is an 82 year old white male who presents with a left inguinal hernia. The risks and benefits of the procedure including bleeding, infection, bleeding, and the possibility of recurrence of the hernia were fully explained to the patient, who gave informed consent.  Procedure note:  The patient was placed in the supine position after spinal anesthesia was a Company secretary. The left groin region was prepped and draped using the usual sterile technique with CHG. Surgical site confirmation was performed.  An incision was made in the left groin region down to the external oblique aponeuroses. The aponeuroses was incised to the external ring. A Penrose drain was placed around the spermatic cord. The vase deferens was noted within the spermatic cord. It appeared the patient had previous repair of the floor of the left inguinal region. No mesh was present. The patient had a direct hernia sac. This was freed away from the spermatic cord and inverted. A large Bard PerFix plug was then inserted. An onlay patch was then placed along the floor of the inguinal canal and secured superiorly to the conjoined tendon and inferiorly to the shelving edge of Poupart's ligament using a 2-0 Novafil interrupted suture. Care was taken to avoid any nerve structures. The internal ring was re-created using a 2-0 Novafil interrupted suture. The external oblique aponeuroses was reapproximated using a 2-0 Vicryl running suture. Subcutaneous layer was reapproximated using 3-0 Vicryl interrupted suture. Exparel was instilled into the surrounding wound. The skin was closed using a 4-0 Vicryl subcuticular suture. Dermabond was applied.  All tape and needle counts were  correct at the end of the procedure. Patient was transferred to PACU in stable condition.    Complications:  None  EBL:  Minimal  Specimen:  None

## 2015-06-10 NOTE — Anesthesia Postprocedure Evaluation (Signed)
Anesthesia Post Note  Patient: Cody George  Procedure(s) Performed: Procedure(s) (LRB): HERNIA REPAIR INGUINAL ADULT WITH MESH (Left)  Patient location during evaluation: PACU Anesthesia Type: Spinal Level of consciousness: awake, oriented and patient cooperative Pain management: pain level controlled Vital Signs Assessment: post-procedure vital signs reviewed and stable Respiratory status: spontaneous breathing, nonlabored ventilation and respiratory function stable Cardiovascular status: blood pressure returned to baseline Postop Assessment: no signs of nausea or vomiting and spinal receding Anesthetic complications: no    Last Vitals:  Filed Vitals:   06/10/15 0851 06/10/15 0900  BP: 99/73 105/71  Pulse: 60 83  Temp: 36.3 C   Resp: 20 19    Last Pain: There were no vitals filed for this visit.               Docia Klar J

## 2015-06-10 NOTE — Anesthesia Preprocedure Evaluation (Addendum)
Anesthesia Evaluation  Patient identified by MRN, date of birth, ID band Patient awake    Reviewed: Allergy & Precautions, NPO status , Patient's Chart, lab work & pertinent test results  Airway Mallampati: I       Dental  (+) Poor Dentition, Partial Upper   Pulmonary shortness of breath and with exertion, pneumonia, resolved, COPD, Current Smoker,  Hypoxemia - RA Sat=84% Not on O2 Rx at home.   breath sounds clear to auscultation       Cardiovascular hypertension, Pt. on medications + DOE  + dysrhythmias Atrial Fibrillation + pacemaker  Rhythm:Regular Rate:Normal     Neuro/Psych    GI/Hepatic GERD  Medicated and Controlled,  Endo/Other    Renal/GU      Musculoskeletal   Abdominal   Peds  Hematology   Anesthesia Other Findings   Reproductive/Obstetrics                            Anesthesia Physical Anesthesia Plan  ASA: IV  Anesthesia Plan: Spinal   Post-op Pain Management:    Induction: Intravenous  Airway Management Planned: Simple Face Mask  Additional Equipment:   Intra-op Plan:   Post-operative Plan:   Informed Consent: I have reviewed the patients History and Physical, chart, labs and discussed the procedure including the risks, benefits and alternatives for the proposed anesthesia with the patient or authorized representative who has indicated his/her understanding and acceptance.     Plan Discussed with: Surgeon  Anesthesia Plan Comments:         Anesthesia Quick Evaluation

## 2015-06-10 NOTE — Anesthesia Procedure Notes (Addendum)
Spinal Patient location during procedure: OR Start time: 06/10/2015 7:50 AM Staffing Resident/CRNA: Bernette Redbird J Preanesthetic Checklist Completed: patient identified, site marked, surgical consent, pre-op evaluation, timeout performed, IV checked, risks and benefits discussed and monitors and equipment checked Spinal Block Patient position: left lateral decubitus Prep: Betadine Patient monitoring: heart rate, cardiac monitor, continuous pulse ox and blood pressure Approach: left paramedian Location: L2-3 Injection technique: single-shot Needle Needle type: Spinocan  Needle gauge: 22 G Assessment Sensory level: T8  [id number]       05/2016

## 2015-06-10 NOTE — Transfer of Care (Signed)
Immediate Anesthesia Transfer of Care Note  Patient: Cody George  Procedure(s) Performed: Procedure(s) with comments: HERNIA REPAIR INGUINAL ADULT WITH MESH (Left) - Pt notified to arrive at 6:15am KF  Patient Location: PACU  Anesthesia Type:Spinal  Level of Consciousness: awake and patient cooperative  Airway & Oxygen Therapy: Patient Spontanous Breathing and Patient connected to face mask oxygen  Post-op Assessment: Report given to RN and Post -op Vital signs reviewed and stable  Post vital signs: Reviewed and stable  Last Vitals:  Filed Vitals:   06/10/15 0715 06/10/15 0730  BP: 131/85 122/82  Pulse:    Temp:    Resp: 16 27    Last Pain: There were no vitals filed for this visit.       Complications: No apparent anesthesia complications

## 2015-06-10 NOTE — Discharge Instructions (Signed)
Cody George on Friday.   Open Hernia Repair, Care After Refer to this sheet in the next few weeks. These instructions provide you with information on caring for yourself after your procedure. Your health care provider may also give you more specific instructions. Your treatment has been planned according to current medical practices, but problems sometimes occur. Call your health care provider if you have any problems or questions after your procedure. WHAT TO EXPECT AFTER THE PROCEDURE After your procedure, it is typical to have the following:  Pain in your abdomen, especially along your incision. You will be given pain medicines to control the pain.  Constipation. You may be given a stool softener to help prevent this. HOME CARE INSTRUCTIONS  Only take over-the-counter or prescription medicines as directed by your health care provider.  Keep the incision area dry and clean. You may wash the incision area gently with soap and water 48 hours after surgery. Gently blot or dab the incision area dry. Do not take baths, use swimming pools, or use hot tubs for 10 days or until your health care provider approves.  Change bandages (dressings) as directed by your health care provider.  Continue your normal diet as directed by your health care provider. Eat plenty of fruits and vegetables to help prevent constipation.  Drink enough fluids to keep your urine clear or pale yellow. This also helps prevent constipation.  Do not drive until your health care provider says it is okay.  Do not lift anything heavier than 10 lb (4.5 kg) or play contact sports for 4 weeks or until your health care provider approves.  Follow up with your health care provider as directed. Ask your health care provider when to make an appointment to have your stitches (sutures) or staples removed. SEEK MEDICAL CARE IF:  You have increased bleeding coming from the incision site.  You have blood in your stool.  You have  increasing pain in the incision area.  You see redness or swelling in the incision area.  You have fluid (pus) coming from the incision.  You have a fever.  You notice a bad smell coming from the incision area or dressing. SEEK IMMEDIATE MEDICAL CARE IF:  You develop a rash.  You have chest pain or shortness of breath.  You feel lightheaded or feel faint.   This information is not intended to replace advice given to you by your health care provider. Make sure you discuss any questions you have with your health care provider.   Document Released: 08/20/2004 Document Revised: 02/21/2014 Document Reviewed: 09/12/2012 Elsevier Interactive Patient Education [employer].

## 2015-06-11 ENCOUNTER — Encounter (HOSPITAL_COMMUNITY): Payer: Self-pay | Admitting: General Surgery

## 2015-06-11 ENCOUNTER — Telehealth: Payer: Self-pay | Admitting: Cardiovascular Disease

## 2015-06-11 NOTE — Telephone Encounter (Signed)
New Message  Pt received letter that he missed remote transmission- was in hospital- Pt stated he needs help sending signal. Please call back and discuss.

## 2015-06-12 ENCOUNTER — Ambulatory Visit (INDEPENDENT_AMBULATORY_CARE_PROVIDER_SITE_OTHER): Payer: Medicare Other | Admitting: *Deleted

## 2015-06-12 DIAGNOSIS — I495 Sick sinus syndrome: Secondary | ICD-10-CM | POA: Diagnosis not present

## 2015-06-12 NOTE — Telephone Encounter (Signed)
Spoke w/ pt and instructed pt how to send a manual transmission. Pt verbalized understanding.

## 2015-06-15 NOTE — Progress Notes (Signed)
Remote pacemaker transmission.   

## 2015-06-19 DIAGNOSIS — R21 Rash and other nonspecific skin eruption: Secondary | ICD-10-CM | POA: Diagnosis not present

## 2015-06-19 DIAGNOSIS — R5383 Other fatigue: Secondary | ICD-10-CM | POA: Diagnosis not present

## 2015-06-19 DIAGNOSIS — E559 Vitamin D deficiency, unspecified: Secondary | ICD-10-CM | POA: Diagnosis not present

## 2015-06-19 DIAGNOSIS — D519 Vitamin B12 deficiency anemia, unspecified: Secondary | ICD-10-CM | POA: Diagnosis not present

## 2015-06-19 DIAGNOSIS — R531 Weakness: Secondary | ICD-10-CM | POA: Diagnosis not present

## 2015-06-26 DIAGNOSIS — R5383 Other fatigue: Secondary | ICD-10-CM | POA: Diagnosis not present

## 2015-06-26 DIAGNOSIS — R531 Weakness: Secondary | ICD-10-CM | POA: Diagnosis not present

## 2015-06-26 DIAGNOSIS — E871 Hypo-osmolality and hyponatremia: Secondary | ICD-10-CM | POA: Diagnosis not present

## 2015-07-22 ENCOUNTER — Encounter: Payer: Self-pay | Admitting: Cardiology

## 2015-07-24 LAB — CUP PACEART REMOTE DEVICE CHECK
Implantable Lead Implant Date: 20130607
Implantable Lead Location: 753860
Lead Channel Sensing Intrinsic Amplitude: 12 mV
MDC IDC LEAD IMPLANT DT: 20130607
MDC IDC LEAD LOCATION: 753859
MDC IDC MSMT LEADCHNL RA PACING THRESHOLD AMPLITUDE: 1 V
MDC IDC MSMT LEADCHNL RA PACING THRESHOLD PULSEWIDTH: 0.4 ms
MDC IDC MSMT LEADCHNL RA SENSING INTR AMPL: 2.3 mV
MDC IDC SESS DTM: 20170609134547
Pulse Gen Model: 2210
Pulse Gen Serial Number: 7353349

## 2015-07-31 ENCOUNTER — Other Ambulatory Visit: Payer: Self-pay | Admitting: Internal Medicine

## 2015-09-07 ENCOUNTER — Ambulatory Visit (HOSPITAL_COMMUNITY)
Admission: RE | Admit: 2015-09-07 | Discharge: 2015-09-07 | Disposition: A | Discharge status: Home / Self Care | Payer: Medicare Other | Source: Ambulatory Visit | Attending: Adult Health Nurse Practitioner | Admitting: Adult Health Nurse Practitioner

## 2015-09-07 ENCOUNTER — Other Ambulatory Visit (HOSPITAL_COMMUNITY): Payer: Self-pay | Admitting: Adult Health Nurse Practitioner

## 2015-09-07 DIAGNOSIS — R059 Cough, unspecified: Secondary | ICD-10-CM

## 2015-09-07 DIAGNOSIS — R531 Weakness: Secondary | ICD-10-CM | POA: Diagnosis not present

## 2015-09-07 DIAGNOSIS — I700 Atherosclerosis of aorta: Secondary | ICD-10-CM | POA: Diagnosis not present

## 2015-09-07 DIAGNOSIS — R05 Cough: Secondary | ICD-10-CM

## 2015-09-07 DIAGNOSIS — J439 Emphysema, unspecified: Secondary | ICD-10-CM | POA: Insufficient documentation

## 2015-09-14 ENCOUNTER — Ambulatory Visit (INDEPENDENT_AMBULATORY_CARE_PROVIDER_SITE_OTHER): Payer: Medicare Other | Admitting: *Deleted

## 2015-09-14 DIAGNOSIS — I495 Sick sinus syndrome: Secondary | ICD-10-CM

## 2015-09-14 NOTE — Progress Notes (Signed)
Remote pacemaker transmission.   

## 2015-09-15 ENCOUNTER — Encounter: Payer: Self-pay | Admitting: Cardiology

## 2015-09-18 LAB — CUP PACEART REMOTE DEVICE CHECK
Battery Remaining Percentage: 81 %
Battery Voltage: 2.93 V
Brady Statistic AP VP Percent: 1 %
Brady Statistic AS VP Percent: 1 %
Brady Statistic AS VS Percent: 1 %
Brady Statistic RV Percent Paced: 12 %
Date Time Interrogation Session: 20170730081502
Implantable Lead Implant Date: 20130607
Implantable Lead Location: 753860
Lead Channel Impedance Value: 450 Ohm
Lead Channel Pacing Threshold Amplitude: 1 V
Lead Channel Pacing Threshold Amplitude: 1.25 V
Lead Channel Pacing Threshold Pulse Width: 0.4 ms
Lead Channel Pacing Threshold Pulse Width: 0.4 ms
Lead Channel Sensing Intrinsic Amplitude: 12 mV
Lead Channel Setting Pacing Amplitude: 2 V
Lead Channel Setting Pacing Amplitude: 2.5 V
MDC IDC LEAD IMPLANT DT: 20130607
MDC IDC LEAD LOCATION: 753859
MDC IDC MSMT BATTERY REMAINING LONGEVITY: 93 mo
MDC IDC MSMT LEADCHNL RA IMPEDANCE VALUE: 410 Ohm
MDC IDC MSMT LEADCHNL RA SENSING INTR AMPL: 2.7 mV
MDC IDC PG SERIAL: 7353349
MDC IDC SET LEADCHNL RV PACING PULSEWIDTH: 0.4 ms
MDC IDC SET LEADCHNL RV SENSING SENSITIVITY: 2 mV
MDC IDC STAT BRADY AP VS PERCENT: 99 %
MDC IDC STAT BRADY RA PERCENT PACED: 64 %

## 2015-11-02 ENCOUNTER — Telehealth: Payer: Self-pay | Admitting: Internal Medicine

## 2015-11-02 DIAGNOSIS — I509 Heart failure, unspecified: Secondary | ICD-10-CM | POA: Diagnosis not present

## 2015-11-02 DIAGNOSIS — R5383 Other fatigue: Secondary | ICD-10-CM | POA: Diagnosis not present

## 2015-11-02 DIAGNOSIS — R531 Weakness: Secondary | ICD-10-CM | POA: Diagnosis not present

## 2015-11-02 NOTE — Telephone Encounter (Signed)
Spoke to Pablo Lawrence, NP w Dr. Josue Hector office. Notes she has seen this patient today - w/u for fatigue and patient has been complaining of fatigue and weakness for ~1 yr. Loma Sousa has checked thyroid, B12, CXR - asking if recent echo done. We reviewed findings from March echocardiogram. Notes patient has upcoming appt on Wed 9/20 to see Dr. Debara Pickett. She can view results/office notes so will look at these when she returns to office Friday.  Loma Sousa also wanted to know if OK to prescribe Celexa and Adderall if no heart contraindications and if no suspected cardiac cause for patient's fatigue.  Aware that I will route to Dr. Debara Pickett for his review

## 2015-11-03 NOTE — Telephone Encounter (Signed)
Noted .. i'll reach out to her after I see him in the office.  Dr. Lemmie Evens

## 2015-11-04 ENCOUNTER — Ambulatory Visit (INDEPENDENT_AMBULATORY_CARE_PROVIDER_SITE_OTHER): Payer: Medicare Other | Admitting: Internal Medicine

## 2015-11-04 ENCOUNTER — Encounter: Payer: Self-pay | Admitting: Internal Medicine

## 2015-11-04 ENCOUNTER — Ambulatory Visit (HOSPITAL_COMMUNITY)
Admission: RE | Admit: 2015-11-04 | Discharge: 2015-11-04 | Disposition: A | Discharge status: Home / Self Care | Payer: Medicare Other | Source: Ambulatory Visit | Attending: Internal Medicine | Admitting: Internal Medicine

## 2015-11-04 VITALS — BP 138/84 | HR 93 | Ht 69.0 in | Wt 161.0 lb

## 2015-11-04 DIAGNOSIS — R05 Cough: Secondary | ICD-10-CM

## 2015-11-04 DIAGNOSIS — I350 Nonrheumatic aortic (valve) stenosis: Secondary | ICD-10-CM | POA: Insufficient documentation

## 2015-11-04 DIAGNOSIS — R5383 Other fatigue: Secondary | ICD-10-CM | POA: Diagnosis not present

## 2015-11-04 DIAGNOSIS — R0602 Shortness of breath: Secondary | ICD-10-CM | POA: Insufficient documentation

## 2015-11-04 DIAGNOSIS — R059 Cough, unspecified: Secondary | ICD-10-CM

## 2015-11-04 LAB — CBC WITH DIFFERENTIAL/PLATELET
Basophils Absolute: 0 cells/uL (ref 0–200)
Basophils Relative: 0 %
Eosinophils Absolute: 81 cells/uL (ref 15–500)
Eosinophils Relative: 1 %
HCT: 43.6 % (ref 38.5–50.0)
Hemoglobin: 14.7 g/dL (ref 13.2–17.1)
LYMPHS ABS: 810 {cells}/uL — AB (ref 850–3900)
LYMPHS PCT: 10 %
MCH: 31.7 pg (ref 27.0–33.0)
MCHC: 33.7 g/dL (ref 32.0–36.0)
MCV: 94 fL (ref 80.0–100.0)
MONO ABS: 810 {cells}/uL (ref 200–950)
MONOS PCT: 10 %
MPV: 9.4 fL (ref 7.5–12.5)
NEUTROS ABS: 6399 {cells}/uL (ref 1500–7800)
Neutrophils Relative %: 79 %
PLATELETS: 205 10*3/uL (ref 140–400)
RBC: 4.64 MIL/uL (ref 4.20–5.80)
RDW: 14.3 % (ref 11.0–15.0)
WBC: 8.1 10*3/uL (ref 3.8–10.8)

## 2015-11-04 LAB — BRAIN NATRIURETIC PEPTIDE: BRAIN NATRIURETIC PEPTIDE: 392.5 pg/mL — AB (ref <100)

## 2015-11-04 LAB — COMPREHENSIVE METABOLIC PANEL
ALT: 15 U/L (ref 9–46)
AST: 18 U/L (ref 10–35)
Albumin: 4.1 g/dL (ref 3.6–5.1)
Alkaline Phosphatase: 58 U/L (ref 40–115)
BUN: 14 mg/dL (ref 7–25)
CHLORIDE: 94 mmol/L — AB (ref 98–110)
CO2: 28 mmol/L (ref 20–31)
Calcium: 9.2 mg/dL (ref 8.6–10.3)
Creat: 0.7 mg/dL (ref 0.70–1.11)
GLUCOSE: 92 mg/dL (ref 65–99)
POTASSIUM: 4.2 mmol/L (ref 3.5–5.3)
Sodium: 134 mmol/L — ABNORMAL LOW (ref 135–146)
TOTAL PROTEIN: 6.7 g/dL (ref 6.1–8.1)
Total Bilirubin: 0.9 mg/dL (ref 0.2–1.2)

## 2015-11-04 MED ORDER — FUROSEMIDE 40 MG PO TABS: 40.0000 mg | ORAL_TABLET | Freq: Every day | ORAL | 6 refills | Status: DC

## 2015-11-04 NOTE — Patient Instructions (Signed)
Medication Instructions:  START Lasix 40mg  Once a day  Labwork: Your physician recommends that you return for lab work in: TODAY CBC W/ DIFF, CMET, AND BNP   Testing/Procedures: Your physician has requested that you have an echocardiogram. Echocardiography is a painless test that uses sound waves to create images of your heart. It provides your doctor with information about the size and shape of your heart and how well your heart's chambers and valves are working. This procedure takes approximately one hour. There are no restrictions for this procedure.  A chest x-ray takes a picture of the organs and structures inside the chest, including the heart, lungs, and blood vessels. This test can show several things, including, whether the heart is enlarges; whether fluid is building up in the lungs; and whether pacemaker / defibrillator leads are still in place.  Follow-Up: Your physician recommends that you schedule a follow-up appointment in: AFTER ALL TESTS IN 1-2 WEEKS.  Any Other Special Instructions Will Be Listed Below (If Applicable).  NEXT APPT  DATE:______________________________________  TIME:______________________________________AM/PM    If you need a refill on your cardiac medications before your next appointment, please call your pharmacy.

## 2015-11-04 NOTE — Progress Notes (Signed)
OFFICE NOTE  Chief Complaint:  Don't feel well, coughing, short of breath  Primary Care Physician: Wende Neighbors, MD  HPI:  Cody George is an 82 year old gentleman who has a history of atrial fibrillation and tachy-brady syndrome as well as paroxysmal atrial fibrillation and sinus node arrest. He underwent a St. Jude Accent DR dual-chamber pacemaker in June of 2013. He has done fairly well other than he had some hypotension on lisinopril which was discontinued. He has recently had some tachyarrhythmias and had increased his metoprolol succinate to 25 mg daily due to tachyarrhythmias. He is enrolled in a home monitoring service with the [facility] patient care network. He was successfully transitioned over to Eliquis she is taking without any bleeding difficulty. He denies any shortness of breath or worsening chest pain. He is due for a remote pacer check in a few days.  Cody George returns for follow-up today. He is feeling well denies any chest pain or worsening shortness of breath. He has no palpitations. Blood pressure is well controlled. He had recent bilateral cataract surgery and is seeing much better. There are no bleeding issues.  I saw Cody George back in the office today for follow-up. Tells me a few months ago he had pneumonia and has been very slow to recover. Chest x-ray shows improvement however there are changes consistent with COPD. He has a greater than 65 year history of smoking. I do not believe he carries a diagnosis of COPD and is currently not on any medications for this. He reports some worsening shortness of breath as well has fatigue. He denies any particular chest pain. He does have valvular heart disease and history of aortic stenosis. Recent echo in March shows some worsening of valvular function with now moderate aortic stenosis.  Cody George returns today for follow-up. Overall he is without complaints. He scheduled to see Dr. Loletha Grayer in the office next week for follow-up  of his pacemaker. He is atrially paced today. He had a low burden of atrial fibrillation but remains on Eliquis. He is asymptomatic with this. He's had no bleeding problems on Eliquis. Blood pressure is high normal today 152/82. He says that generally is between 448 and 185 systolic at home. I reminded him that his prior echocardiogram showed what may be moderate aortic stenosis. Interestingly, I have not been able to auscultate that on exam. His prior echo was almost a year ago and will be due for repeat echo this year.  11/04/2015  Cody George was seen today in the office. He reports that over the past several months she's had a significant decline. He has had worsening energy and fatigue as well as shortness of breath. He can only walk short distances before becoming significantly short of breath. He is also developed a mildly productive cough with a tannish sputum. He is concerned about whether he might have had recurrent pneumonia. He does have a history of COPD and pneumonia in the past. An echocardiogram was performed and March 2017 which showed mild to moderate aortic stenosis by valve gradient however visually the aortic valve appeared to be moderate to severely stenosed. On exam is very difficult with his COPD and hyperexpansion to auscultate his aortic murmur and I cannot reliably hear the second heart sound. Cody George appeared to be short of breath today and was unable to finish sentences at times. He denies any fevers or chills at home. He has had some mild recent weight loss. He also complains of  lower extremity swelling, which is worse on the left leg than the right. There is some orthopnea.  PMHx:  Past Medical History:  Diagnosis Date  . Arthritis   . Cellulitis   . Community acquired pneumonia 04/14/2014  . Dysrhythmia    PAF  . GERD (gastroesophageal reflux disease)   . History of nuclear stress test 10/05/2010   dipyridamole; normal pattern of perfusion in all regions; no  significant ishcemia, low risk scan   . Hypertension   . OA (osteoarthritis)   . Pacemaker 07/22/2011   St. 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 Paroxysmal atrial fibrillation (HCC)   . Prostate cancer [facility])    radiation    Past Surgical History:  Procedure Laterality Date  . HERNIA REPAIR Bilateral   . INGUINAL HERNIA REPAIR Left 06/10/2015   Procedure: HERNIA REPAIR INGUINAL ADULT WITH MESH;  Surgeon: Aviva Signs, MD;  Location: [facility];  Service: General;  Laterality: Left;  Pt notified to arrive at 6:15am KF  . NM MYOCAR PERF WALL MOTION  10/05/10   normal  . PACEMAKER INSERTION  07/22/2011   St. 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 PERMANENT PACEMAKER INSERTION N/A 07/22/2011   Procedure: PERMANENT PACEMAKER INSERTION;  Surgeon: Sanda Klein, MD;  Location: [place] CATH [facility];  Service: Cardiovascular;  Laterality: N/A;  . PILONIDAL CYST DRAINAGE     x2  . TRANSTHORACIC ECHOCARDIOGRAM  10/05/2010   EF=50-55%, normal LV systolic function; LA & RA mildly dilated; mild MR; mild TR with RV systolic pressure elevted at 30-26mHg; AV mildly sclerotic with mild calcif of AV leaflets, mild valvular AS, mild-mod regurg; trace pulm valve regurg  . UKoreaECHOCARDIOGRAPHY  10/05/10   LA mildly dilated, mild TR, mild ca+ of AOV ,mild to mod. AI    FAMHx:  No family history on file.  SOCHx:   reports that he has been smoking.  He has a 32.50 pack-year smoking history. He has never used smokeless tobacco. He reports that he does not drink alcohol or use drugs.  ALLERGIES:  No Known Allergies  ROS: Pertinent items noted in HPI and remainder of comprehensive ROS otherwise negative.  HOME MEDS: Current Outpatient Prescriptions  Medication Sig Dispense Refill  . ELIQUIS 5 MG TABS tablet TAKE ONE TABLET TWICE DAILY 60 tablet 5  . ferrous sulfate 325 (65 FE) MG EC tablet Take 325 mg by mouth daily with breakfast.     . metoprolol succinate (TOPROL-XL) 50 MG 24 hr tablet TAKE ONE (1) TABLET BY MOUTH EVERY DAY TAKE WITH OR IMMEDIATELY FOLLOWING A MEAL 90 tablet 3  . tamsulosin (FLOMAX) 0.4 MG CAPS capsule Take 0.4 mg by mouth daily.    . vitamin B-12 (CYANOCOBALAMIN) 1000 MCG tablet Take 1,000 mcg by mouth daily.    .Marland KitchenVITAMIN C, CALCIUM ASCORBATE, PO Take 1,000 mg by mouth daily.     .Marland KitchenVITAMIN D, CHOLECALCIFEROL, PO Take 1,000 mg by mouth daily.     . furosemide (LASIX) 40 MG tablet Take 1 tablet (40 mg total) by mouth daily. 30 tablet 6   No current facility-administered medications for this visit.     LABS/IMAGING: Results for orders placed or performed in visit on 11/04/15 (from the past 48 hour(s))  CBC w/Diff     Status: Abnormal   Collection Time: 11/04/15 12:08 PM  Result Value Ref Range   WBC 8.1 3.8 - 10.8 K/uL  RBC 4.64 4.20 - 5.80 MIL/uL   Hemoglobin 14.7 13.2 - 17.1 g/dL   HCT 43.6 38.5 - 50.0 %   MCV 94.0 80.0 - 100.0 fL   MCH 31.7 27.0 - 33.0 pg   MCHC 33.7 32.0 - 36.0 g/dL   RDW 14.3 11.0 - 15.0 %   Platelets 205 140 - 400 K/uL   MPV 9.4 7.5 - 12.5 fL   Neutro Abs 6,399 1,500 - 7,800 cells/uL   Lymphs Abs 810 (L) 850 - 3,900 cells/uL   Monocytes Absolute 810 200 - 950 cells/uL   Eosinophils Absolute 81 15 - 500 cells/uL   Basophils Absolute 0 0 - 200 cells/uL   Neutrophils Relative % 79 %   Lymphocytes Relative 10 %   Monocytes Relative 10 %   Eosinophils Relative 1 %   Basophils Relative 0 %   Smear Review Criteria for review not met   Comprehensive Metabolic Panel (CMET)     Status: Abnormal   Collection Time: 11/04/15 12:08 PM  Result Value Ref Range   Sodium 134 (L) 135 - 146 mmol/L   Potassium 4.2 3.5 - 5.3 mmol/L   Chloride 94 (L) 98 - 110 mmol/L   CO2 28 20 - 31 mmol/L   Glucose, Bld 92 65 - 99 mg/dL   BUN 14 7 - 25 mg/dL   Creat 0.70 0.70 - 1.11 mg/dL    Comment:   For patients > or = 80 years of age: The upper reference limit for Creatinine is approximately  13% higher for people identified as African-American.      Total Bilirubin 0.9 0.2 - 1.2 mg/dL   Alkaline Phosphatase 58 40 - 115 U/L   AST 18 10 - 35 U/L   ALT 15 9 - 46 U/L   Total Protein 6.7 6.1 - 8.1 g/dL   Albumin 4.1 3.6 - 5.1 g/dL   Calcium 9.2 8.6 - 10.3 mg/dL  B Nat Peptide     Status: Abnormal   Collection Time: 11/04/15 12:08 PM  Result Value Ref Range   Brain Natriuretic Peptide 392.5 (H) <100 pg/mL    Comment:   BNP levels increase with age in the general population with the highest values seen in individuals greater than 20 years of age. Reference: Joellyn Rued Cardiol 2002; 70:017-49.      Dg Chest 2 View  Result Date: 11/04/2015 CLINICAL DATA:  Cough x several months/some sob/smoker x 75 years/htn/hx prostate ca/hx pacemaker insertion EXAM: CHEST  2 VIEW COMPARISON:  09/07/2015 FINDINGS: Cardiac silhouette is mildly enlarged. No mediastinal or hilar masses or evidence other adenopathy. The aorta is uncoiled. Lungs are hyperexpanded but clear. No pleural effusion. No pneumothorax. Left anterior chest wall sequential pacemaker is stable and well positioned. Bony thorax is demineralized but intact. IMPRESSION: No acute cardiopulmonary disease. Hyperexpanded lungs consistent with COPD. Electronically Signed   By: Lajean Manes M.D.   On: 11/04/2015 16:25    VITALS: BP 138/84   Pulse 93   Ht 5' 9"  (1.753 m)   Wt 161 lb (73 kg)   BMI 23.78 kg/m   EXAM: General appearance: alert, fatigued and mild distress Neck: JVD - 5 cm above sternal notch and no carotid bruit Lungs: diminished breath sounds bilaterally and rhonchi LLL Heart: regular rate and rhythm, S1, S2 normal, systolic murmur: systolic ejection 2/6, crescendo at 2nd right intercostal space and possibly a diastolic mumur Abdomen: soft, non-tender; bowel sounds normal; no masses,  no organomegaly Extremities: edema  2+ LLE and 1+ RLE edema Pulses: 2+ and symmetric Skin: Skin color, texture, turgor normal. No  rashes or lesions Neurologic: Grossly normal Psych: Appears depressed  EKG: Atrial fibrillation at 93  ASSESSMENT: 1. Acute systolic congestive heart failure 2. Paroxysmal atrial fibrillation with sinus node arrest 3. Status post St. Jude Accent DR pacemaker 4. Labile hypertension - controlled 5. Chronic anticoagulation on Eliquis 6. Fatigue and dyspnea 7. COPD 8. Aortic stenosis - at least moderate if not severe by echo 04/2015  PLAN: 1.   Cody George has had significant decline since I saw him last January. His echo showed moderate aortic stenosis however the valve gradients were lower and did not seem to correlate with the visual amount of stenosis on the valve. Over the past few months he's developed worsening shortness of breath, fatigue and productive cough. I recommended lab work and a chest x-ray today to rule out any pneumonia or pleural effusion. The chest x-ray was performed and shows no evidence of either finding except for COPD. Lab work indicated a normal white blood cell count and no evidence of anemia. He was noted to have an elevated BNP of 392, supporting the diagnosis of congestive heart failure. Prior to the end of the office visit I recommended starting him on Lasix 40 mg daily. We will obtain a repeat echocardiogram to further assess his aortic valve gradient and for any development of cardiomyopathy. I suspect he may have severe aortic stenosis and need additional imaging either with TEE if his valve gradients are not significant or catheterization as part of a preoperative workup.  Follow-up with me after his echo.  Pixie Casino, MD, [facility] Attending Cardiologist [place] 11/04/2015, 5:08 PM

## 2015-11-10 ENCOUNTER — Ambulatory Visit (HOSPITAL_COMMUNITY)
Admission: RE | Admit: 2015-11-10 | Discharge: 2015-11-10 | Disposition: A | Discharge status: Home / Self Care | Payer: Medicare Other | Source: Ambulatory Visit | Attending: Internal Medicine | Admitting: Internal Medicine

## 2015-11-10 DIAGNOSIS — R5383 Other fatigue: Secondary | ICD-10-CM | POA: Diagnosis not present

## 2015-11-10 DIAGNOSIS — R0602 Shortness of breath: Secondary | ICD-10-CM | POA: Diagnosis not present

## 2015-11-10 DIAGNOSIS — I340 Nonrheumatic mitral (valve) insufficiency: Secondary | ICD-10-CM | POA: Diagnosis not present

## 2015-11-10 DIAGNOSIS — I517 Cardiomegaly: Secondary | ICD-10-CM | POA: Diagnosis not present

## 2015-11-10 DIAGNOSIS — I071 Rheumatic tricuspid insufficiency: Secondary | ICD-10-CM | POA: Diagnosis not present

## 2015-11-10 DIAGNOSIS — I351 Nonrheumatic aortic (valve) insufficiency: Secondary | ICD-10-CM | POA: Insufficient documentation

## 2015-11-10 DIAGNOSIS — R0600 Dyspnea, unspecified: Secondary | ICD-10-CM | POA: Diagnosis present

## 2015-11-10 DIAGNOSIS — I350 Nonrheumatic aortic (valve) stenosis: Secondary | ICD-10-CM | POA: Insufficient documentation

## 2015-11-10 LAB — ECHOCARDIOGRAM COMPLETE
AV Area VTI index: 1.14 cm2/m2
AV Peak grad: 19 mmHg
AV peak Index: 1.03
AV pk vel: 219 cm/s
AV vel: 2.16
AVAREAMEANV: 1.99 cm2
AVAREAMEANVIN: 1.05 cm2/m2
AVAREAVTI: 1.95 cm2
AVCELMEANRAT: 0.48
AVG: 11 mmHg
Ao pk vel: 0.47 m/s
CHL CUP DOP CALC LVOT VTI: 21.6 cm
CHL CUP MV DEC (S): 176
CHL CUP RV SYS PRESS: 42 mmHg
DOP CAL AO MEAN VELOCITY: 151 cm/s
EWDT: 176 ms
FS: 17 % — AB (ref 28–44)
IVS/LV PW RATIO, ED: 0.96
LA ID, A-P, ES: 38 mm
LADIAMINDEX: 2.01 cm/m2
LAVOL: 84.3 mL
LAVOLA4C: 96 mL
LAVOLIN: 44.6 mL/m2
LDCA: 4.15 cm2
LEFT ATRIUM END SYS DIAM: 38 mm
LV PW d: 11.4 mm — AB (ref 0.6–1.1)
LV SIMPSON'S DISK: 41
LV dias vol index: 57 mL/m2
LV dias vol: 108 mL (ref 62–150)
LV sys vol: 64 mL — AB (ref 21–61)
LVOT peak grad rest: 4 mmHg
LVOTD: 23 mm
LVOTPV: 103 cm/s
LVOTSV: 90 mL
LVOTVTI: 0.52 cm
LVSYSVOLIN: 34 mL/m2
MV Peak grad: 3 mmHg
MVPKEVEL: 85 m/s
P 1/2 time: 417 ms
Reg peak vel: 312 cm/s
Stroke v: 44 ml
TAPSE: 18.9 mm
TRMAXVEL: 312 cm/s
VTI: 136 cm
VTI: 41.5 cm
Valve area index: 1.14
Valve area: 2.16 cm2

## 2015-11-10 NOTE — Progress Notes (Signed)
*  PRELIMINARY RESULTS* Echocardiogram 2D Echocardiogram has been performed.  Cody George 11/10/2015, 2:15 PM

## 2015-11-12 ENCOUNTER — Telehealth: Payer: Self-pay | Admitting: Cardiology

## 2015-11-12 NOTE — Telephone Encounter (Signed)
-----   Message from Sanda Klein, MD sent at 11/12/2015  9:48 AM EDT ----- Can we please do an ad hoc download and see if still in a fib, please? Thanks [employer]

## 2015-11-12 NOTE — Telephone Encounter (Signed)
Pt is currently in afib

## 2015-11-12 NOTE — Telephone Encounter (Signed)
Spoke w/ pt and requested that he send a manual transmission per MD request. Instructed pt on how to do this.

## 2015-11-13 NOTE — Telephone Encounter (Signed)
Thanks .. I have an appointment with him on Monday. Will start amiodarone and plan for TEE/DCCV.  -[name]

## 2015-11-16 ENCOUNTER — Encounter: Payer: Self-pay | Admitting: Internal Medicine

## 2015-11-16 ENCOUNTER — Ambulatory Visit (INDEPENDENT_AMBULATORY_CARE_PROVIDER_SITE_OTHER): Payer: Medicare Other | Admitting: Internal Medicine

## 2015-11-16 VITALS — BP 124/73 | HR 82 | Ht 69.0 in | Wt 161.8 lb

## 2015-11-16 DIAGNOSIS — Z01818 Encounter for other preprocedural examination: Secondary | ICD-10-CM | POA: Diagnosis not present

## 2015-11-16 DIAGNOSIS — R5383 Other fatigue: Secondary | ICD-10-CM

## 2015-11-16 DIAGNOSIS — D689 Coagulation defect, unspecified: Secondary | ICD-10-CM | POA: Diagnosis not present

## 2015-11-16 DIAGNOSIS — I481 Persistent atrial fibrillation: Secondary | ICD-10-CM | POA: Diagnosis not present

## 2015-11-16 DIAGNOSIS — I4819 Other persistent atrial fibrillation: Secondary | ICD-10-CM

## 2015-11-16 DIAGNOSIS — Z950 Presence of cardiac pacemaker: Secondary | ICD-10-CM

## 2015-11-16 MED ORDER — AMIODARONE HCL 400 MG PO TABS: ORAL_TABLET | ORAL | 3 refills | Status: DC

## 2015-11-16 NOTE — Patient Instructions (Addendum)
Medication Instructions:  START Amirodrone 400mg  Take 1 tab by mouth twice a day for 7 days, THEN Take 1 tablet Once a day  Labwork: Your physician recommends that you return for lab work in: 7 days before Cardioversion-CBC, BMET, PT/INT, TSH   Testing/Procedures: Your physician has recommended that you have a Cardioversion (DCCV). Electrical Cardioversion uses a jolt of electricity to your heart either through paddles or wired patches attached to your chest. This is a controlled, usually prescheduled, procedure. Defibrillation is done under light anesthesia in the hospital, and you usually go home the day of the procedure. This is done to get your heart back into a normal rhythm. You are not awake for the procedure. Please see the instruction sheet given to you today.  Follow-Up: Your physician recommends that you schedule a follow-up appointment in: 1 MONTH with DR HILTY.  Any Other Special Instructions Will Be Listed Below (If Applicable).  SCHEDULE CARDIOVERSION WITH DR HILTY OR DR CROITORU IF DR HILTY IS UNAVAILABLE.   If you need a refill on your cardiac medications before your next appointment, please call your pharmacy.  NEXT APPT  DATE:________________________________________  TIME:_____________________________________________AM/PM

## 2015-11-17 ENCOUNTER — Other Ambulatory Visit (HOSPITAL_COMMUNITY): Payer: Medicare Other

## 2015-11-18 ENCOUNTER — Other Ambulatory Visit: Payer: Self-pay | Admitting: *Deleted

## 2015-11-18 ENCOUNTER — Telehealth: Payer: Self-pay | Admitting: Internal Medicine

## 2015-11-18 ENCOUNTER — Encounter: Payer: Self-pay | Admitting: Internal Medicine

## 2015-11-18 DIAGNOSIS — D689 Coagulation defect, unspecified: Secondary | ICD-10-CM | POA: Insufficient documentation

## 2015-11-18 DIAGNOSIS — I4891 Unspecified atrial fibrillation: Secondary | ICD-10-CM

## 2015-11-18 DIAGNOSIS — Z01818 Encounter for other preprocedural examination: Secondary | ICD-10-CM | POA: Insufficient documentation

## 2015-11-18 NOTE — Progress Notes (Signed)
OFFICE NOTE  Chief Complaint:  Feel much better  Primary Care Physician: Wende Neighbors, MD  HPI:  Cody George is an 82 year old gentleman who has a history of atrial fibrillation and tachy-brady syndrome as well as paroxysmal atrial fibrillation and sinus node arrest. He underwent a St. Jude Accent DR dual-chamber pacemaker in June of 2013. He has done fairly well other than he had some hypotension on lisinopril which was discontinued. He has recently had some tachyarrhythmias and had increased his metoprolol succinate to 25 mg daily due to tachyarrhythmias. He is enrolled in a home monitoring service with the [facility] patient care network. He was successfully transitioned over to Eliquis she is taking without any bleeding difficulty. He denies any shortness of breath or worsening chest pain. He is due for a remote pacer check in a few days.  Cody George returns for follow-up today. He is feeling well denies any chest pain or worsening shortness of breath. He has no palpitations. Blood pressure is well controlled. He had recent bilateral cataract surgery and is seeing much better. There are no bleeding issues.  I saw Cody George back in the office today for follow-up. Tells me a few months ago he had pneumonia and has been very slow to recover. Chest x-ray shows improvement however there are changes consistent with COPD. He has a greater than 65 year history of smoking. I do not believe he carries a diagnosis of COPD and is currently not on any medications for this. He reports some worsening shortness of breath as well has fatigue. He denies any particular chest pain. He does have valvular heart disease and history of aortic stenosis. Recent echo in March shows some worsening of valvular function with now moderate aortic stenosis.  Cody George returns today for follow-up. Overall he is without complaints. He scheduled to see Dr. Loletha Grayer in the office next week for follow-up of his pacemaker. He is  atrially paced today. He had a low burden of atrial fibrillation but remains on Eliquis. He is asymptomatic with this. He's had no bleeding problems on Eliquis. Blood pressure is high normal today 152/82. He says that generally is between [id number] and [id number] systolic at home. I reminded him that his prior echocardiogram showed what may be moderate aortic stenosis. Interestingly, I have not been able to auscultate that on exam. His prior echo was almost a year ago and will be due for repeat echo this year.  11/04/2015  Cody George was seen today in the office. He reports that over the past several months she's had a significant decline. He has had worsening energy and fatigue as well as shortness of breath. He can only walk short distances before becoming significantly short of breath. He is also developed a mildly productive cough with a tannish sputum. He is concerned about whether he might have had recurrent pneumonia. He does have a history of COPD and pneumonia in the past. An echocardiogram was performed and March 2017 which showed mild to moderate aortic stenosis by valve gradient however visually the aortic valve appeared to be moderate to severely stenosed. On exam is very difficult with his COPD and hyperexpansion to auscultate his aortic murmur and I cannot reliably hear the second heart sound. Cody George appeared to be short of breath today and was unable to finish sentences at times. He denies any fevers or chills at home. He has had some mild recent weight loss. He also complains of lower extremity swelling, which  is worse on the left leg than the right. There is some orthopnea.  11/16/2015  Cody George returns today for follow-up. He reports feeling much better. His weight has come down several pounds and he notes marked improvement in his edema. His shortness of breath is also improved and he feels much more energized. Unfortunately his echocardiogram shows a newly reduced LVEF of 20-25% with global  hypokinesis. 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 His chest x-ray showed no evidence of pneumonia.  PMHx:  Past Medical History:  Diagnosis Date  . Arthritis   . Cellulitis   . Community acquired pneumonia 04/14/2014  . Dysrhythmia    PAF  . GERD (gastroesophageal reflux disease)   . History of nuclear stress test 10/05/2010   dipyridamole; normal pattern of perfusion in all regions; no significant ishcemia, low risk scan   . Hypertension   . OA (osteoarthritis)   . Pacemaker 07/22/2011   St. 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 Paroxysmal atrial fibrillation (HCC)   . Prostate cancer ([place])    radiation  . Shortness of breath     Past Surgical History:  Procedure Laterality Date  . HERNIA REPAIR Bilateral   . INGUINAL HERNIA REPAIR Left 06/10/2015   Procedure: HERNIA REPAIR INGUINAL ADULT WITH MESH;  Surgeon: Aviva Signs, MD;  Location: [facility];  Service: General;  Laterality: Left;  Pt notified to arrive at 6:15am KF  . NM MYOCAR PERF WALL MOTION  10/05/10   normal  . PACEMAKER INSERTION  07/22/2011   St. 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 PERMANENT PACEMAKER INSERTION N/A 07/22/2011   Procedure: PERMANENT PACEMAKER INSERTION;  Surgeon: Sanda Klein, MD;  Location: [place] CATH LAB;  Service: Cardiovascular;  Laterality: N/A;  . PILONIDAL CYST DRAINAGE     x2  . TRANSTHORACIC ECHOCARDIOGRAM  10/05/2010   EF=50-55%, normal LV systolic function; LA & RA mildly dilated; mild MR; mild TR with RV systolic pressure elevted at 30-28mmHg; AV mildly sclerotic with mild calcif of AV leaflets, mild valvular AS, mild-mod regurg; trace pulm valve regurg  . US ECHOCARDIOGRAPHY  10/05/10   LA mildly dilated, mild TR, mild ca+ of AOV ,mild to mod. AI    FAMHx:  No family  history on file.  SOCHx:   reports that he has been smoking.  He has a 32.50 pack-year smoking history. He has never used smokeless tobacco. He reports that he does not drink alcohol or use drugs.  ALLERGIES:  No Known Allergies  ROS: Pertinent items noted in HPI and remainder of comprehensive ROS otherwise negative.  HOME MEDS: Current Outpatient Prescriptions  Medication Sig Dispense Refill  . ELIQUIS 5 MG TABS tablet TAKE ONE TABLET TWICE DAILY 60 tablet 5  . ferrous sulfate 325 (65 FE) MG EC tablet Take 325 mg by mouth daily with breakfast.    . furosemide (LASIX) 40 MG tablet Take 1 tablet (40 mg total) by mouth daily. 30 tablet 6  . metoprolol succinate (TOPROL-XL) 50 MG 24 hr tablet TAKE ONE (1) TABLET BY MOUTH EVERY DAY TAKE WITH OR IMMEDIATELY FOLLOWING A MEAL 90 tablet 3  . tamsulosin (FLOMAX) 0.4 MG CAPS capsule Take 0.4 mg by mouth daily.    . vitamin B-12 (CYANOCOBALAMIN) 1000 MCG tablet Take 1,000 mcg by mouth daily.    Marland Kitchen VITAMIN  C, CALCIUM ASCORBATE, PO Take 1,000 mg by mouth daily.     Marland Kitchen VITAMIN D, CHOLECALCIFEROL, PO Take 1,000 mg by mouth daily.     Marland Kitchen amiodarone (PACERONE) 400 MG tablet TAKE 1 TAB BY MOUTH TWICE A DAY FOR 7 DAYS THEN TAKE 1 TABLET ONCE A DAY 40 tablet 3   No current facility-administered medications for this visit.     LABS/IMAGING: No results found for this or any previous visit (from the past 48 hour(s)). No results found.  VITALS: BP 124/73   Pulse 82   Ht 5\' 9"  (1.753 m)   Wt 161 lb 12.8 oz (73.4 kg)   SpO2 98%   BMI 23.89 kg/m   EXAM: General appearance: alert, fatigued and mild distress Neck: no carotid bruit and no JVD Lungs: diminished breath sounds bilaterally Heart: irregularly irregular rhythm, S1, S2 normal, systolic murmur: systolic ejection 2/6, crescendo at 2nd right intercostal space and possibly a diastolic mumur Abdomen: soft, non-tender; bowel sounds normal; no masses,  no organomegaly Extremities: edema Trace LLE  edema Pulses: 2+ and symmetric Skin: Skin color, texture, turgor normal. No rashes or lesions Neurologic: Grossly normal Psych: Much more pleasant and awake today  EKG: Deferred  ASSESSMENT: 1. Acute systolic congestive heart failure 2. Persistent atrial fibrillation with sinus node arrest 3. Status post St. Jude Accent DR pacemaker 4. Labile hypertension - controlled 5. Chronic anticoagulation on Eliquis 6. Fatigue and dyspnea 7. COPD 8. Aortic stenosis - at least moderate if not severe by echo 04/2015  PLAN: 1.   Cody George has recently had a decline in his LVEF to 20-25%, which may be a tachycardia mediated cardiopathy. Recent check of his pacemaker shows more persistent atrial fibrillation with some fast rates. He seems to have responded to diuretics and is breathing better. I discussed future management options including attempted cardioversion. I'm concerned given his low EF and large left atrium that he will not hold sinus rhythm without antiarrhythmic therapy. I reviewed this with Dr. Loletha Grayer and he is in agreement. We will plan 2 weeks of amiodarone loading with 4 mg by mouth twice a day for 1 week and then 400 mg daily.  He's already been anticoagulated for 2 weeks on Eliquis and I would recommend continuing that for upcoming DCCV after 3-4 weeks of anticoagulation and 2 weeks of amiodarone loading. If we can restore sinus rhythm then we will continue to treat for heart failure and if LVEF does not improve, he may need cardiac catheterization for additional workup of his new LV dysfunction although he has had no chest pain whatsoever.  Follow-up after cardioversion.  Pixie Casino, MD, [facility] Attending Cardiologist [place] 11/18/2015, 6:05 PM

## 2015-11-18 NOTE — Telephone Encounter (Signed)
Returned call and discussed w pharmacist, Lynelle Smoke, at [facility]. I suggested problem might be due to the 400mg  dose. She tried running through for the 200mg  pills (at equivalent dosing, for 2 tablets twice daily x7 days and 1 tablet twice a day thereafter.) This worked and did not flag a rejection/prior authorization requirement. Pharmacist informs me she will call patient and inform him of med availability and instructions for use.

## 2015-11-18 NOTE — Telephone Encounter (Signed)
New message      Pt c/o medication issue:  1. Name of Medication: amiodarone 2. How are you currently taking this medication (dosage and times per day)? 400mg  3. Are you having a reaction (difficulty breathing--STAT)? no  4. What is your medication issue? Need prior authorization.  Pt has not been able to get medication filled.

## 2015-11-23 ENCOUNTER — Telehealth: Payer: Self-pay | Admitting: Internal Medicine

## 2015-11-23 NOTE — Telephone Encounter (Signed)
Instruct him to decrease to 400 mg daily starting tomorrow - see if this helps.  Dr. Lemmie Evens

## 2015-11-23 NOTE — Telephone Encounter (Signed)
New message   Pt daughter called and verbalized that she is calling to speak to the rn about the medications that pt is on for the procedure that he is having with Dr.Hilty on 10-23

## 2015-11-23 NOTE — Telephone Encounter (Signed)
Can only recommend that he try to avoid naps as this will decrease his nighttime sleep patterns.  Dr. Debara Pickett would have to determine if he needs prescription sleep aid.

## 2015-11-23 NOTE — Telephone Encounter (Addendum)
Called patient and discussed w wife [place], [place]) and gave recommendations to cut dose of amiodarone starting tomorrow. She voiced understanding of recommendations and thanks.

## 2015-11-23 NOTE — Telephone Encounter (Signed)
Spoke to daughter, listed DPR. She notes concern - patient recently on amiodarone for pre cardioversion dosing. He is taking the 400mg  BID and will go to lower dose this Wednesday. C/o sleeplessness & agitation since starting medication, daughter concerned bc of how fatigued he feels. Pt has been able to "catnap" but trouble sleeping at night.  He is aware not to discontinue medication.  Daughter requesting sleep aid for patient. He has taken OTC generic for unisom (diphenhydramine) w/o improvement.  Will request recommendations from pharmD.

## 2015-12-01 ENCOUNTER — Institutional Professional Consult (permissible substitution): Payer: Medicare Other | Admitting: Pulmonary Disease

## 2015-12-01 DIAGNOSIS — R5383 Other fatigue: Secondary | ICD-10-CM | POA: Diagnosis not present

## 2015-12-01 DIAGNOSIS — D689 Coagulation defect, unspecified: Secondary | ICD-10-CM | POA: Diagnosis not present

## 2015-12-01 DIAGNOSIS — Z01818 Encounter for other preprocedural examination: Secondary | ICD-10-CM | POA: Diagnosis not present

## 2015-12-01 LAB — CBC
HCT: 44.1 % (ref 38.5–50.0)
Hemoglobin: 14.6 g/dL (ref 13.2–17.1)
MCH: 31.1 pg (ref 27.0–33.0)
MCHC: 33.1 g/dL (ref 32.0–36.0)
MCV: 93.8 fL (ref 80.0–100.0)
MPV: 9.1 fL (ref 7.5–12.5)
PLATELETS: 213 10*3/uL (ref 140–400)
RBC: 4.7 MIL/uL (ref 4.20–5.80)
RDW: 14.3 % (ref 11.0–15.0)
WBC: 8.9 10*3/uL (ref 3.8–10.8)

## 2015-12-01 LAB — APTT: APTT: 30 s (ref 22–34)

## 2015-12-01 LAB — PROTIME-INR
INR: 1.2 — ABNORMAL HIGH
PROTHROMBIN TIME: 12.3 s — AB (ref 9.0–11.5)

## 2015-12-02 LAB — BASIC METABOLIC PANEL
BUN: 16 mg/dL (ref 7–25)
CALCIUM: 9.4 mg/dL (ref 8.6–10.3)
CO2: 32 mmol/L — ABNORMAL HIGH (ref 20–31)
CREATININE: 0.87 mg/dL (ref 0.70–1.11)
Chloride: 93 mmol/L — ABNORMAL LOW (ref 98–110)
Glucose, Bld: 92 mg/dL (ref 65–99)
Potassium: 4.6 mmol/L (ref 3.5–5.3)
SODIUM: 137 mmol/L (ref 135–146)

## 2015-12-02 LAB — TSH: TSH: 5.33 m[IU]/L — AB (ref 0.40–4.50)

## 2015-12-07 ENCOUNTER — Ambulatory Visit (HOSPITAL_BASED_OUTPATIENT_CLINIC_OR_DEPARTMENT_OTHER): Payer: Medicare Other

## 2015-12-07 ENCOUNTER — Encounter (HOSPITAL_COMMUNITY)
Admission: RE | Disposition: A | Discharge status: Home / Self Care | Payer: Self-pay | Source: Ambulatory Visit | Attending: Internal Medicine

## 2015-12-07 ENCOUNTER — Ambulatory Visit (HOSPITAL_COMMUNITY): Payer: Medicare Other | Admitting: Anesthesiology

## 2015-12-07 ENCOUNTER — Encounter (HOSPITAL_COMMUNITY): Payer: Self-pay | Admitting: Anesthesiology

## 2015-12-07 ENCOUNTER — Ambulatory Visit (HOSPITAL_COMMUNITY)
Admission: RE | Admit: 2015-12-07 | Discharge: 2015-12-07 | Disposition: A | Discharge status: Home / Self Care | Payer: Medicare Other | Source: Ambulatory Visit | Attending: Internal Medicine | Admitting: Internal Medicine

## 2015-12-07 DIAGNOSIS — I481 Persistent atrial fibrillation: Secondary | ICD-10-CM

## 2015-12-07 DIAGNOSIS — R0602 Shortness of breath: Secondary | ICD-10-CM | POA: Diagnosis present

## 2015-12-07 DIAGNOSIS — I4891 Unspecified atrial fibrillation: Secondary | ICD-10-CM | POA: Diagnosis not present

## 2015-12-07 DIAGNOSIS — I509 Heart failure, unspecified: Secondary | ICD-10-CM | POA: Diagnosis not present

## 2015-12-07 DIAGNOSIS — I351 Nonrheumatic aortic (valve) insufficiency: Secondary | ICD-10-CM

## 2015-12-07 DIAGNOSIS — I5021 Acute systolic (congestive) heart failure: Secondary | ICD-10-CM

## 2015-12-07 DIAGNOSIS — I083 Combined rheumatic disorders of mitral, aortic and tricuspid valves: Secondary | ICD-10-CM | POA: Diagnosis not present

## 2015-12-07 DIAGNOSIS — M1990 Unspecified osteoarthritis, unspecified site: Secondary | ICD-10-CM | POA: Diagnosis not present

## 2015-12-07 DIAGNOSIS — K219 Gastro-esophageal reflux disease without esophagitis: Secondary | ICD-10-CM | POA: Diagnosis not present

## 2015-12-07 DIAGNOSIS — I10 Essential (primary) hypertension: Secondary | ICD-10-CM | POA: Diagnosis not present

## 2015-12-07 DIAGNOSIS — I350 Nonrheumatic aortic (valve) stenosis: Secondary | ICD-10-CM

## 2015-12-07 DIAGNOSIS — F17200 Nicotine dependence, unspecified, uncomplicated: Secondary | ICD-10-CM | POA: Diagnosis not present

## 2015-12-07 DIAGNOSIS — I700 Atherosclerosis of aorta: Secondary | ICD-10-CM | POA: Insufficient documentation

## 2015-12-07 DIAGNOSIS — Z950 Presence of cardiac pacemaker: Secondary | ICD-10-CM | POA: Diagnosis present

## 2015-12-07 DIAGNOSIS — R5383 Other fatigue: Secondary | ICD-10-CM | POA: Diagnosis present

## 2015-12-07 DIAGNOSIS — I371 Nonrheumatic pulmonary valve insufficiency: Secondary | ICD-10-CM | POA: Diagnosis not present

## 2015-12-07 DIAGNOSIS — I4819 Other persistent atrial fibrillation: Secondary | ICD-10-CM

## 2015-12-07 DIAGNOSIS — I5023 Acute on chronic systolic (congestive) heart failure: Secondary | ICD-10-CM | POA: Diagnosis present

## 2015-12-07 HISTORY — DX: Acute systolic (congestive) heart failure: I50.21

## 2015-12-07 HISTORY — PX: CARDIOVERSION: SHX1299

## 2015-12-07 HISTORY — PX: TEE WITHOUT CARDIOVERSION: SHX5443

## 2015-12-07 SURGERY — CARDIOVERSION
Anesthesia: Monitor Anesthesia Care

## 2015-12-07 MED ORDER — SODIUM CHLORIDE 0.9 % IV SOLN
INTRAVENOUS | Status: DC | PRN
  Administered 2015-12-07: 13:00:00 via INTRAVENOUS

## 2015-12-07 MED ORDER — PROPOFOL 500 MG/50ML IV EMUL
INTRAVENOUS | Status: DC | PRN
  Administered 2015-12-07: 75 ug/kg/min via INTRAVENOUS

## 2015-12-07 MED ORDER — LIDOCAINE HCL (CARDIAC) 20 MG/ML IV SOLN
INTRAVENOUS | Status: DC | PRN
  Administered 2015-12-07: 40 mg via INTRAVENOUS

## 2015-12-07 MED ORDER — PROPOFOL 10 MG/ML IV BOLUS
INTRAVENOUS | Status: DC | PRN
  Administered 2015-12-07 (×2): 20 mg via INTRAVENOUS

## 2015-12-07 NOTE — Discharge Instructions (Signed)
Electrical Cardioversion, Care After °Refer to this sheet in the next few weeks. These instructions provide you with information on caring for yourself after your procedure. Your health care provider may also give you more specific instructions. Your treatment has been planned according to current medical practices, but problems sometimes occur. Call your health care provider if you have any problems or questions after your procedure. °WHAT TO EXPECT AFTER THE PROCEDURE °After your procedure, it is typical to have the following sensations: °· Some redness on the skin where the shocks were delivered. If this is tender, a sunburn lotion or hydrocortisone cream may help. °· Possible return of an abnormal heart rhythm within hours or days after the procedure. °HOME CARE INSTRUCTIONS °· Take medicines only as directed by your health care provider. Be sure you understand how and when to take your medicine. °· Learn how to feel your pulse and check it often. °· Limit your activity for 48 hours after the procedure or as directed by your health care provider. °· Avoid or minimize caffeine and other stimulants as directed by your health care provider. °SEEK MEDICAL CARE IF: °· You feel like your heart is beating too fast or your pulse is not regular. °· You have any questions about your medicines. °· You have bleeding that will not stop. °SEEK IMMEDIATE MEDICAL CARE IF: °· You are dizzy or feel faint. °· It is hard to breathe or you feel short of breath. °· There is a change in discomfort in your chest. °· Your speech is slurred or you have trouble moving an arm or leg on one side of your body. °· You get a serious muscle cramp that does not go away. °· Your fingers or toes turn cold or blue. °  °This information is not intended to replace advice given to you by your health care provider. Make sure you discuss any questions you have with your health care provider. °  °Document Released: 11/21/2012 Document Revised: 02/21/2014  Document Reviewed: 11/21/2012 °Elsevier Interactive Patient Education ©2016 [employer] °Transesophageal Echocardiogram °Transesophageal echocardiography (TEE) is a special type of test that produces images of the heart by using sound waves (echocardiogram). This type of echocardiography can obtain better images of the heart than standard echocardiography. TEE is done by passing a flexible tube down the esophagus. The heart is located in front of the esophagus. Because the heart and esophagus are close to one another, your health care provider can take very clear, detailed pictures of the heart via ultrasound waves. °TEE may be done: °· If your health care provider needs more information based on standard echocardiography findings. °· If you had a stroke. This might have happened because a clot formed in your heart. TEE can visualize different areas of the heart and check for clots. °· To check valve anatomy and function. °· To check for infection on the inside of your heart (endocarditis). °· To evaluate the dividing wall (septum) of the heart and presence of a hole that did not close after birth (patent foramen ovale or atrial septal defect). °· To help diagnose a tear in the wall of the aorta (aortic dissection). °· During cardiac valve surgery. This allows the surgeon to assess the valve repair before closing the chest. °· During a variety of other cardiac procedures to guide positioning of catheters. °· Sometimes before a cardioversion, which is a shock to convert heart rhythm back to normal. °LET YOUR HEALTH CARE PROVIDER KNOW ABOUT:  °· Any allergies you   have. °· All medicines you are taking, including vitamins, herbs, eye drops, creams, and over-the-counter medicines. °· Previous problems you or members of your family have had with the use of anesthetics. °· Any blood disorders you have. °· Previous surgeries you have had. °· Medical conditions you have. °· Swallowing difficulties. °· An esophageal  obstruction. °RISKS AND COMPLICATIONS  °Generally, TEE is a safe procedure. However, as with any procedure, complications can occur. Possible complications include an esophageal tear (rupture). °BEFORE THE PROCEDURE  °· Do not eat or drink for 6 hours before the procedure or as directed by your health care provider. °· Arrange for someone to drive you home after the procedure. Do not drive yourself home. During the procedure, you will be given medicines that can continue to make you feel drowsy and can impair your reflexes. °· An IV access tube will be started in the arm. °PROCEDURE  °· A medicine to help you relax (sedative) will be given through the IV access tube. °· A medicine may be sprayed or gargled to numb the back of the throat. °· Your blood pressure, heart rate, and breathing (vital signs) will be monitored during the procedure. °· The TEE probe is a long, flexible tube. The tip of the probe is placed into the back of the mouth, and you will be asked to swallow. This helps to pass the tip of the probe into the esophagus. Once the tip of the probe is in the correct area, your health care provider can take pictures of the heart. °· TEE is usually not a painful procedure. You may feel the probe press against the back of the throat. The probe does not enter the trachea and does not affect your breathing. °AFTER THE PROCEDURE  °· You will be in bed, resting, until you have fully returned to consciousness. °· When you first awaken, your throat may feel slightly sore and will probably still feel numb. This will improve slowly over time. °· You will not be allowed to eat or drink until it is clear that the numbness has improved. °· Once you have been able to drink, urinate, and sit on the edge of the bed without feeling sick to your stomach (nausea) or dizzy, you may be cleared to go home. °· You should have a friend or family member with you for the next 24 hours after your procedure. °  °This information is not  intended to replace advice given to you by your health care provider. Make sure you discuss any questions you have with your health care provider. °  °Document Released: 04/23/2002 Document Revised: 02/05/2013 Document Reviewed: 08/02/2012 °Elsevier Interactive Patient Education ©2016 [employer] ° °

## 2015-12-07 NOTE — CV Procedure (Addendum)
TRANSESOPHAGEAL ECHOCARDIOGRAM (TEE) NOTE  INDICATIONS: atrial fibrillation, systolic congestive heart failure, planned cardioversion  PROCEDURE:   Informed consent was obtained prior to the procedure. The risks, benefits and alternatives for the procedure were discussed and the patient comprehended these risks.  Risks include, but are not limited to, cough, sore throat, vomiting, nausea, somnolence, esophageal and stomach trauma or perforation, bleeding, low blood pressure, aspiration, pneumonia, infection, trauma to the teeth and death.    After a procedural time-out, the patient was given Propofol per anesthesia for moderate sedation.  The patient's heart rate, blood pressure, and oxygen saturation are monitored continuously during the procedure.The oropharynx was anesthetized with topical cetacaine spray.  The transesophageal probe was inserted in the esophagus and stomach without difficulty and multiple views were obtained.  The patient was kept under observation until the patient left the procedure room. The patient left the procedure room in stable condition.   Agitated microbubble saline contrast was not administered.  COMPLICATIONS:    There were no immediate complications.  Findings:  1. LEFT VENTRICLE: The left ventricular wall thickness is mildly increased.  The left ventricular cavity is dilated in size. Wall motion is severely hypokinetic.  LVEF is 25-30%.  2. RIGHT VENTRICLE:  The right ventricle is normal in structure and function without any thrombus or masses.    3. LEFT ATRIUM:  The left atrium is severely dilated in size without any thrombus or masses.  There is spontaneous echo contrast ("smoke") in the left atrium consistent with a low flow state.  4. LEFT ATRIAL APPENDAGE:  The left atrial appendage is free of any thrombus or masses. The appendage has single lobes. Pulse doppler indicates moderate flow in the appendage.  5. ATRIAL SEPTUM:  The atrial septum  appears intact and is free of thrombus and/or masses.  There is no evidence for interatrial shunting by color doppler.  6. RIGHT ATRIUM:  The right atrium is normal in size and function without any thrombus or masses.  7. MITRAL VALVE:  The mitral valve is normal in structure and function with Mild regurgitation.  There were no vegetations or stenosis.  8. AORTIC VALVE:  The aortic valve is trileaflet and calcified with mild stenosis and Moderate central regurgitation.  There were no vegetations.  9. TRICUSPID VALVE:  The tricuspid valve is normal in structure and function with Mild regurgitation.  There were no vegetations or stenosis  10.  PULMONIC VALVE:  The pulmonic valve is normal in structure and function with trivial regurgitation.  There were no vegetations or stenosis.   11. AORTIC ARCH, ASCENDING AND DESCENDING AORTA:  There was grade 1 Ron Parker et. Al, 1992) atherosclerosis of the ascending aorta, aortic arch, or proximal descending aorta. The proximal ascending aorta was dilated to 4.5 cm.  12. PULMONARY VEINS: Anomalous pulmonary venous return was not noted.  13. PERICARDIUM: The pericardium appeared normal and non-thickened.  There is no pericardial effusion.  IMPRESSION:   1. No LAA thrombus, however, LA smoke was noted 2. Severely dilated LA 3. Negative for PFO by color doppler 4. Moderate central AI and mild AS, mild MR/TR, trivial PI 5. Severe global hypokinesis, LVEF 25-30% 6. Successful DCCV (after 3 stacked shocks - 120J, 150J and 200J) to atrial paced rhythm at 70  RECOMMENDATIONS:    1. Continue amiodarone 400 mg daily - patient reports fatigue, which he thinks is due to poor sleep on the medication - may just be CHF/a-fib, hopefully this will improve 2. Continue  Eliquis - patient was strongly advised to maintain compliance with this 3. Follow-up with me in 1 month -will need device check  Time Spent Directly with the Patient:  60 minutes   Pixie Casino,  MD, [facility] Attending Cardiologist [facility]  12/07/2015, 3:30 PM

## 2015-12-07 NOTE — Progress Notes (Signed)
  Echocardiogram Echocardiogram Transesophageal has been performed.  Darlina Sicilian M 12/07/2015, 3:19 PM

## 2015-12-07 NOTE — H&P (Signed)
    INTERVAL PROCEDURE H&P  History and Physical Interval Note:  12/07/2015 1:12 PM  Cody George has presented today for their planned procedure. The various methods of treatment have been discussed with the patient and family. After consideration of risks, benefits and other options for treatment, the patient has consented to the procedure.  The patients' outpatient history has been reviewed, patient examined, and no change in status from most recent office note within the past 30 days. I have reviewed the patients' chart and labs and will proceed as planned. Questions were answered to the patient's satisfaction.   The patient reports that he may have missed some doses of his Eliquis, perhaps last Friday evening. Neither he nor his wife are certain. Interrogation of his device shows he is in an atrial tach currently. Discussed TEE guided approach, including risks/benefits/alternatives and he is in agreement with this.  Pixie Casino, MD, [facility] Attending Cardiologist [place] [facility] 12/07/2015, 1:12 PM

## 2015-12-07 NOTE — Transfer of Care (Signed)
Immediate Anesthesia Transfer of Care Note  Patient: Cody George  Procedure(s) Performed: Procedure(s): CARDIOVERSION (N/A) TRANSESOPHAGEAL ECHOCARDIOGRAM (TEE) (N/A)  Patient Location: Endoscopy Unit  Anesthesia Type:MAC  Level of Consciousness: sedated and patient cooperative  Airway & Oxygen Therapy: Patient Spontanous Breathing and Patient connected to nasal cannula oxygen  Post-op Assessment: Report given to RN and Post -op Vital signs reviewed and stable  Post vital signs: Reviewed  Last Vitals:  Vitals:   12/07/15 1216  BP: (!) 189/84  Pulse: 60  Resp: (!) 21  Temp: 36.5 C    Last Pain:  Vitals:   12/07/15 1216  TempSrc: Oral         Complications: No apparent anesthesia complications

## 2015-12-07 NOTE — Anesthesia Procedure Notes (Signed)
Procedure Name: MAC Date/Time: 12/07/2015 2:45 PM Performed by: Jenne Campus Pre-anesthesia Checklist: Patient identified, Emergency Drugs available, Suction available, Patient being monitored and Timeout performed Patient Re-evaluated:Patient Re-evaluated prior to inductionOxygen Delivery Method: Nasal cannula

## 2015-12-07 NOTE — Anesthesia Postprocedure Evaluation (Signed)
Anesthesia Post Note  Patient: RICHARDS SNIPE  Procedure(s) Performed: Procedure(s) (LRB): CARDIOVERSION (N/A) TRANSESOPHAGEAL ECHOCARDIOGRAM (TEE) (N/A)  Patient location during evaluation: PACU Anesthesia Type: MAC Level of consciousness: awake and alert Pain management: pain level controlled Vital Signs Assessment: post-procedure vital signs reviewed and stable Respiratory status: spontaneous breathing, nonlabored ventilation, respiratory function stable and patient connected to nasal cannula oxygen Cardiovascular status: stable and blood pressure returned to baseline Anesthetic complications: no    Last Vitals:  Vitals:   12/07/15 1520 12/07/15 1525  BP: 126/82 (!) 151/77  Pulse: 70 70  Resp: (!) 23 19  Temp:      Last Pain:  Vitals:   12/07/15 1515  TempSrc: Oral                 Zenaida Deed

## 2015-12-07 NOTE — Anesthesia Preprocedure Evaluation (Addendum)
Anesthesia Evaluation  Patient identified by MRN, date of birth, ID band Patient awake    Reviewed: Allergy & Precautions, H&P , NPO status , Patient's Chart, lab work & pertinent test results  History of Anesthesia Complications Negative for: history of anesthetic complications  Airway Mallampati: II  TM Distance: >3 FB Neck ROM: full    Dental no notable dental hx. (+) Partial Upper, Dental Advisory Given   Pulmonary shortness of breath, Current Smoker,    Pulmonary exam normal breath sounds clear to auscultation       Cardiovascular hypertension, Pt. on medications +CHF  Normal cardiovascular exam+ dysrhythmias Atrial Fibrillation + pacemaker  Rhythm:regular Rate:Normal  Non-ischemic stress test in 2016, neg result   Neuro/Psych negative neurological ROS     GI/Hepatic Neg liver ROS, GERD  ,  Endo/Other  negative endocrine ROS  Renal/GU negative Renal ROS     Musculoskeletal  (+) Arthritis ,   Abdominal   Peds  Hematology negative hematology ROS (+)   Anesthesia Other Findings   Reproductive/Obstetrics negative OB ROS                           Anesthesia Physical Anesthesia Plan  ASA: III  Anesthesia Plan: MAC   Post-op Pain Management:    Induction: Intravenous  Airway Management Planned: Mask  Additional Equipment:   Intra-op Plan:   Post-operative Plan:   Informed Consent: I have reviewed the patients History and Physical, chart, labs and discussed the procedure including the risks, benefits and alternatives for the proposed anesthesia with the patient or authorized representative who has indicated his/her understanding and acceptance.   Dental Advisory Given  Plan Discussed with: Anesthesiologist, CRNA and Surgeon  Anesthesia Plan Comments:         Anesthesia Quick Evaluation

## 2015-12-08 ENCOUNTER — Encounter (HOSPITAL_COMMUNITY): Payer: Self-pay | Admitting: Internal Medicine

## 2015-12-14 ENCOUNTER — Telehealth: Payer: Self-pay | Admitting: Internal Medicine

## 2015-12-14 ENCOUNTER — Ambulatory Visit (INDEPENDENT_AMBULATORY_CARE_PROVIDER_SITE_OTHER): Payer: Medicare Other | Admitting: *Deleted

## 2015-12-14 DIAGNOSIS — I495 Sick sinus syndrome: Secondary | ICD-10-CM | POA: Diagnosis not present

## 2015-12-14 MED ORDER — AMIODARONE HCL 200 MG PO TABS: 200.0000 mg | ORAL_TABLET | Freq: Every day | ORAL | 3 refills | Status: DC

## 2015-12-14 NOTE — Progress Notes (Signed)
Remote pacemaker transmission.   

## 2015-12-14 NOTE — Telephone Encounter (Signed)
Patient stated he was taking amiodarone 400 mg once a day. Advised patient to reduce to amiodarone 200mg  once a day. He verbalized understanding and will call us if he has any further issues.

## 2015-12-14 NOTE — Telephone Encounter (Signed)
New Message  Pt voiced he is wanting nurse to return his call.  Please f/u with pt

## 2015-12-14 NOTE — Telephone Encounter (Signed)
Confirm that he is down to the 200 mg daily dose. If so, have him cut the dose in half (100 mg daily).  If he is on any dose higher than 200 mg daily, please have him reduce to 200 mg daily.

## 2015-12-14 NOTE — Telephone Encounter (Signed)
Returned call to patient. Since starting amiodarone on 11/16/15 for a fib, he says he has not been able to sleep and feels weak and fatigued. Patient stated he mentioned this to Dr Debara Pickett last week at the cardioversion who said these side effects should subside or get better. Patient stated "I've got to come off this medicine, I cannot keep going on like this." Denies palpitations/fluttering.  He is usually SOB, which is at his baseline per pt, about as usual. Michela Pitcher he took "sleeping pill" which was alprazolam 0.25mg  last night for sleep and only slept 4 hours.  Dr Debara Pickett out of office today, Dr Sallyanne Kuster follows patient as well. Will route to Dr Sallyanne Kuster for advice if ok.

## 2015-12-14 NOTE — Telephone Encounter (Signed)
Patient says he is not sleeping and is very weak due to medication: Amiodarone.  He wants to know if there is something else he can take

## 2015-12-14 NOTE — Telephone Encounter (Signed)
This issue was addressed in duplicate phone call today as well.

## 2015-12-18 ENCOUNTER — Encounter: Payer: Self-pay | Admitting: Cardiology

## 2016-01-06 ENCOUNTER — Encounter: Payer: Self-pay | Admitting: Internal Medicine

## 2016-01-06 ENCOUNTER — Ambulatory Visit (INDEPENDENT_AMBULATORY_CARE_PROVIDER_SITE_OTHER): Payer: Medicare Other | Admitting: Internal Medicine

## 2016-01-06 VITALS — BP 156/82 | HR 70 | Ht 69.0 in | Wt 157.0 lb

## 2016-01-06 DIAGNOSIS — R5383 Other fatigue: Secondary | ICD-10-CM

## 2016-01-06 DIAGNOSIS — I428 Other cardiomyopathies: Secondary | ICD-10-CM | POA: Diagnosis not present

## 2016-01-06 DIAGNOSIS — Z950 Presence of cardiac pacemaker: Secondary | ICD-10-CM | POA: Diagnosis not present

## 2016-01-06 DIAGNOSIS — F32A Depression, unspecified: Secondary | ICD-10-CM | POA: Insufficient documentation

## 2016-01-06 DIAGNOSIS — F329 Major depressive disorder, single episode, unspecified: Secondary | ICD-10-CM | POA: Diagnosis not present

## 2016-01-06 DIAGNOSIS — I480 Paroxysmal atrial fibrillation: Secondary | ICD-10-CM

## 2016-01-06 HISTORY — DX: Depression, unspecified: F32.A

## 2016-01-06 NOTE — Patient Instructions (Addendum)
Medication Instructions:  Your physician recommends that you continue on your current medications as directed. Please refer to the Current Medication list given to you today.  Labwork: None   Testing/Procedures: Your physician has requested that you have an echocardiogram. Echocardiography is a painless test that uses sound waves to create images of your heart. It provides your doctor with information about the size and shape of your heart and how well your heart's chambers and valves are working. This procedure takes approximately one hour. There are no restrictions for this procedure.  SCHEDULE IN 3 MONTHS   Follow-Up: Your physician recommends that you schedule a follow-up appointment in: 3 MONTHS WITH DR HILTY   Any Other Special Instructions Will Be Listed Below (If Applicable).  You have been referred to [facility]    If you need a refill on your cardiac medications before your next appointment, please call your pharmacy.

## 2016-01-06 NOTE — Progress Notes (Addendum)
OFFICE NOTE  Chief Complaint:  "Don't feel well"  Primary Care Physician: Wende Neighbors, MD  HPI:  Cody George is an 82 year old gentleman who has a history of atrial fibrillation and tachy-brady syndrome as well as paroxysmal atrial fibrillation and sinus node arrest. He underwent a St. Jude Accent DR dual-chamber pacemaker in June of 2013. He has done fairly well other than he had some hypotension on lisinopril which was discontinued. He has recently had some tachyarrhythmias and had increased his metoprolol succinate to 25 mg daily due to tachyarrhythmias. He is enrolled in a home monitoring service with the [facility] patient care network. He was successfully transitioned over to Eliquis she is taking without any bleeding difficulty. He denies any shortness of breath or worsening chest pain. He is due for a remote pacer check in a few days.  Cody George returns for follow-up today. He is feeling well denies any chest pain or worsening shortness of breath. He has no palpitations. Blood pressure is well controlled. He had recent bilateral cataract surgery and is seeing much better. There are no bleeding issues.  I saw Cody George back in the office today for follow-up. Tells me a few months ago he had pneumonia and has been very slow to recover. Chest x-ray shows improvement however there are changes consistent with COPD. He has a greater than 65 year history of smoking. I do not believe he carries a diagnosis of COPD and is currently not on any medications for this. He reports some worsening shortness of breath as well has fatigue. He denies any particular chest pain. He does have valvular heart disease and history of aortic stenosis. Recent echo in March shows some worsening of valvular function with now moderate aortic stenosis.  Cody George returns today for follow-up. Overall he is without complaints. He scheduled to see Dr. Loletha Grayer in the office next week for follow-up of his pacemaker. He is  atrially paced today. He had a low burden of atrial fibrillation but remains on Eliquis. He is asymptomatic with this. He's had no bleeding problems on Eliquis. Blood pressure is high normal today 152/82. He says that generally is between [id number] and [id number] systolic at home. I reminded him that his prior echocardiogram showed what may be moderate aortic stenosis. Interestingly, I have not been able to auscultate that on exam. His prior echo was almost a year ago and will be due for repeat echo this year.  11/04/2015  Cody George was seen today in the office. He reports that over the past several months she's had a significant decline. He has had worsening energy and fatigue as well as shortness of breath. He can only walk short distances before becoming significantly short of breath. He is also developed a mildly productive cough with a tannish sputum. He is concerned about whether he might have had recurrent pneumonia. He does have a history of COPD and pneumonia in the past. An echocardiogram was performed and March 2017 which showed mild to moderate aortic stenosis by valve gradient however visually the aortic valve appeared to be moderate to severely stenosed. On exam is very difficult with his COPD and hyperexpansion to auscultate his aortic murmur and I cannot reliably hear the second heart sound. [name] appeared to be short of breath today and was unable to finish sentences at times. He denies any fevers or chills at home. He has had some mild recent weight loss. He also complains of lower extremity swelling, which  is worse on the left leg than the right. There is some orthopnea.  11/16/2015  Cody George returns today for follow-up. He reports feeling much better. His weight has come down several pounds and he notes marked improvement in his edema. His shortness of breath is also improved and he feels much more energized. Unfortunately his echocardiogram shows a newly reduced LVEF of 20-25% with global  hypokinesis. 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 His chest x-ray showed no evidence of pneumonia.  01/06/2016  Cody George was seen today in follow-up. He underwent cardioversion after loading with amiodarone which required 3 shocks but ultimately he converted back to sinus rhythm. Today he is in an atrial paced rhythm but appears to be maintaining sinus. Unfortunately he feels "horrible". It's not clear why he feels worse today but has felt better in the past. He says some days are good and some days are bad. He also became very tearful in the office today. He is still quite upset about the death of a family member last year. I'm concerned that he is struggling from depression. He does not appear volume overloaded on exam. Now that he is back in sinus rhythm, his cardiac output should be better but he still reports fatigue, poor sleep at night, lack of interest in things, tearfulness and other symptoms which may be more likely to be depression.  PMHx:  Past Medical History:  Diagnosis Date  . Acute systolic congestive heart failure ([place]) 12/07/2015  . Arthritis   . Cellulitis   . Community acquired pneumonia 04/14/2014  . Dysrhythmia    PAF  . GERD (gastroesophageal reflux disease)   . History of nuclear stress test 10/05/2010   dipyridamole; normal pattern of perfusion in all regions; no significant ishcemia, low risk scan   . Hypertension   . OA (osteoarthritis)   . Pacemaker 07/22/2011   St. 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 Paroxysmal atrial fibrillation ([facility])   . Prostate cancer ([place])    radiation  . Shortness of breath     Past Surgical History:  Procedure Laterality Date  . CARDIOVERSION N/A 12/07/2015   Procedure: CARDIOVERSION;  Surgeon: Pixie Casino, MD;  Location[facility] ENDOSCOPY;  Service:  Cardiovascular;  Laterality: N/A;  . HERNIA REPAIR Bilateral   . INGUINAL HERNIA REPAIR Left 06/10/2015   Procedure: HERNIA REPAIR INGUINAL ADULT WITH MESH;  Surgeon: Aviva Signs, MD;  Location: [facility] ORS;  Service: General;  Laterality: Left;  Pt notified to arrive at 6:15am KF  . NM MYOCAR PERF WALL MOTION  10/05/10   normal  . PACEMAKER INSERTION  07/22/2011   St. 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 PERMANENT PACEMAKER INSERTION N/A 07/22/2011   Procedure: PERMANENT PACEMAKER INSERTION;  Surgeon: Sanda Klein, MD;  Location: [place] CATH LAB;  Service: Cardiovascular;  Laterality: N/A;  . PILONIDAL CYST DRAINAGE     x2  . TEE WITHOUT CARDIOVERSION N/A 12/07/2015   Procedure: TRANSESOPHAGEAL ECHOCARDIOGRAM (TEE);  Surgeon: Pixie Casino, MD;  Location: [facility]COPY;  Service: Cardiovascular;  Laterality: N/A;  . TRANSTHORACIC ECHOCARDIOGRAM  10/05/2010   EF=50-55%, normal LV systolic function; LA & RA mildly dilated; mild MR; mild TR with RV systolic pressure elevted at 30-89mmHg; AV mildly sclerotic with mild calcif of AV leaflets, mild valvular AS, mild-mod regurg; trace pulm valve  regurg  . US ECHOCARDIOGRAPHY  10/05/10   LA mildly dilated, mild TR, mild ca+ of AOV ,mild to mod. AI    FAMHx:  History reviewed. No pertinent family history.  SOCHx:   reports that he has been smoking.  He has a 32.50 pack-year smoking history. He has never used smokeless tobacco. He reports that he does not drink alcohol or use drugs.  ALLERGIES:  No Known Allergies  ROS: Pertinent items noted in HPI and remainder of comprehensive ROS otherwise negative.  HOME MEDS: Current Outpatient Prescriptions  Medication Sig Dispense Refill  . ELIQUIS 5 MG TABS tablet TAKE ONE TABLET TWICE DAILY 60 tablet 5  . ferrous sulfate 325 (65 FE) MG EC tablet Take 325 mg by mouth daily with breakfast.    . furosemide (LASIX) 40 MG tablet Take 1 tablet (40 mg total) by mouth  daily. 30 tablet 6  . metoprolol succinate (TOPROL-XL) 50 MG 24 hr tablet TAKE ONE (1) TABLET BY MOUTH EVERY DAY TAKE WITH OR IMMEDIATELY FOLLOWING A MEAL 90 tablet 3  . tamsulosin (FLOMAX) 0.4 MG CAPS capsule Take 0.4 mg by mouth daily.    . vitamin B-12 (CYANOCOBALAMIN) 1000 MCG tablet Take 1,000 mcg by mouth daily.    Marland Kitchen VITAMIN C, CALCIUM ASCORBATE, PO Take 1,000 mg by mouth daily.     Marland Kitchen VITAMIN D, CHOLECALCIFEROL, PO Take 1,000 mg by mouth daily.      No current facility-administered medications for this visit.     LABS/IMAGING: No results found for this or any previous visit (from the past 48 hour(s)). No results found.  VITALS: BP (!) 156/82   Pulse 70   Ht 5\' 9"  (1.753 m)   Wt 157 lb (71.2 kg)   BMI 23.18 kg/m   EXAM: General appearance: alert, fatigued and mild distress Neck: no carotid bruit and no JVD Lungs: diminished breath sounds bilaterally Heart: irregularly irregular rhythm, S1, S2 normal, systolic murmur: systolic ejection 2/6, crescendo at 2nd right intercostal space and possibly a diastolic mumur Abdomen: soft, non-tender; bowel sounds normal; no masses,  no organomegaly Extremities: edema Trace LLE edema Pulses: 2+ and symmetric Skin: Skin color, texture, turgor normal. No rashes or lesions Neurologic: Grossly normal Psych: Tearful, depressed  EKG: Atrial paced rhythm at 76, nonspecific ST and T-wave changes  ASSESSMENT: 1. Recent acute systolic congestive heart failure, LVEF 20-25% 2. Persistent atrial fibrillation - s/p DCCV on amiodarone 3. Status post St. Jude Accent DR pacemaker for sinus arrest 4. Labile hypertension - controlled 5. Chronic anticoagulation on Eliquis 6. Fatigue and dyspnea 7. COPD 8. Aortic stenosis - at least moderate if not severe by echo 04/2015 9. Probable depression  PLAN: 1.   Mr. Dethloff seems quite upset today and appears depressed and tearful. He says he is still fatigued and not sleeping well. He blames this all on  the amiodarone. He has since stopped amiodarone on his own. That was about a week ago. I reminded him that amiodarone was to help him stay in rhythm and that he may likely go back into A. fib without it, but the side effects he feels are too much. I'm not certain at all related to amiodarone. I wonder if he has developed some worsening depression. He is beyond grieving for the loss of a child which was last year. At this point he's lost interest in a lot of things and may have even some situational depression with his recent heart failure. Poor sleep is contributing to  this. He does not snore or stop breathing but is noted to have difficulty falling asleep at night. This could be a hallmark feature of that. He could also have some underlying organic brain disease that is causing these symptoms. I think it would be beneficial for him to have a neuropsychiatric evaluation. He may need treatment for this. From a cardiac standpoint, he should be improving since he is in sinus rhythm. I would like to repeat an echocardiogram in about 3 months to see if LV function has improved. We can follow for recurrence of atrial fibrillation with pacemaker interrogations. He plans to see Dr. Loletha Grayer in January. Follow-up with me thereafter.  TIME SPENT WITH PATIENT AND FAMILY: 45 minutes  Pixie Casino, MD, [facility] Attending Cardiologist [facility]  Pixie Casino 01/06/2016, 1:36 PM

## 2016-01-25 DIAGNOSIS — Z0000 Encounter for general adult medical examination without abnormal findings: Secondary | ICD-10-CM | POA: Diagnosis not present

## 2016-01-25 LAB — CUP PACEART REMOTE DEVICE CHECK
Battery Remaining Longevity: 74 mo
Brady Statistic AP VP Percent: 3.8 %
Brady Statistic AS VP Percent: 1 %
Brady Statistic AS VS Percent: 1 %
Implantable Lead Implant Date: 20130607
Lead Channel Impedance Value: 390 Ohm
Lead Channel Impedance Value: 430 Ohm
Lead Channel Pacing Threshold Amplitude: 0.875 V
Lead Channel Pacing Threshold Pulse Width: 0.4 ms
Lead Channel Sensing Intrinsic Amplitude: 12 mV
Lead Channel Setting Pacing Amplitude: 2.5 V
Lead Channel Setting Pacing Amplitude: 2.5 V
MDC IDC LEAD IMPLANT DT: 20130607
MDC IDC LEAD LOCATION: 753859
MDC IDC LEAD LOCATION: 753860
MDC IDC MSMT BATTERY REMAINING PERCENTAGE: 81 %
MDC IDC MSMT BATTERY VOLTAGE: 2.93 V
MDC IDC MSMT LEADCHNL RA PACING THRESHOLD PULSEWIDTH: 0.4 ms
MDC IDC MSMT LEADCHNL RA SENSING INTR AMPL: 0.8 mV
MDC IDC MSMT LEADCHNL RV PACING THRESHOLD AMPLITUDE: 1.25 V
MDC IDC PG IMPLANT DT: 20130607
MDC IDC PG SERIAL: 7353349
MDC IDC SESS DTM: 20171030064916
MDC IDC SET LEADCHNL RV PACING PULSEWIDTH: 0.5 ms
MDC IDC SET LEADCHNL RV SENSING SENSITIVITY: 2 mV
MDC IDC STAT BRADY AP VS PERCENT: 96 %
MDC IDC STAT BRADY RA PERCENT PACED: 43 %
MDC IDC STAT BRADY RV PERCENT PACED: 57 %

## 2016-02-01 ENCOUNTER — Other Ambulatory Visit: Payer: Self-pay | Admitting: Internal Medicine

## 2016-02-01 IMAGING — CT CT CHEST W/ CM
2 of 3 series · 15 of 36 positions shown, 18 images · IV contrast (Omnipaque 300)
Comparison: Radiograph 04/14/2014

CLINICAL DATA: Short of breath 2 days and pneumonia may

EXAM:
CT CHEST WITH CONTRAST
TECHNIQUE: Multidetector CT imaging of the chest was performed during
intravenous contrast administration.
CONTRAST:  80mL OMNIPAQUE IOHEXOL 300 MG/ML  SOLN

[Series 2: chestroutine 5.0 b40f · axial · 0.73mm/px · z∈[-358,-38]mm · 12 of 76 slices shown, 15 images]
[im 6/76  mediastinal]
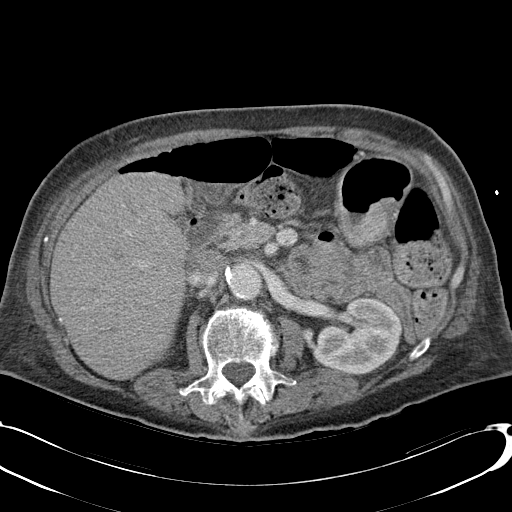
[im 6/76  lung]
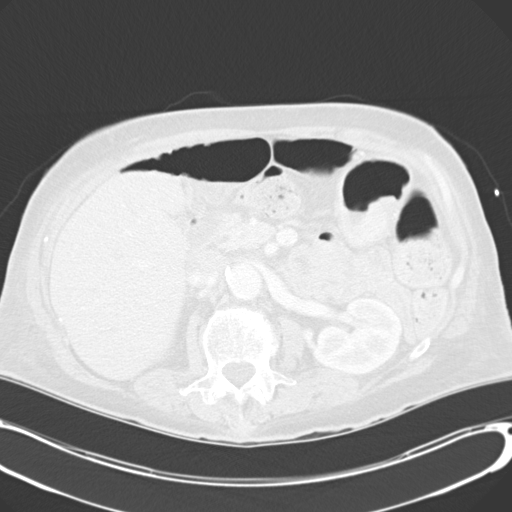
[im 12/76  lung]
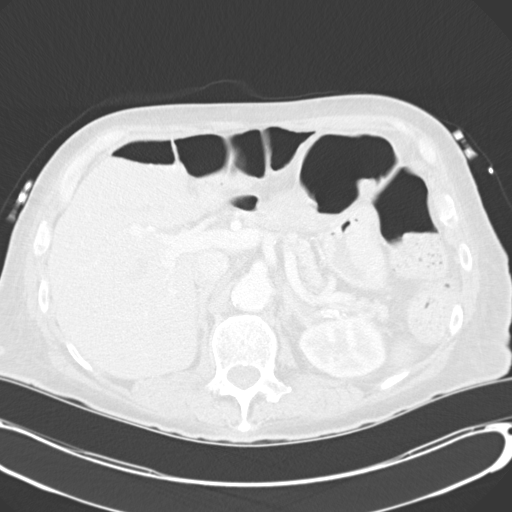
[im 17/76  lung]
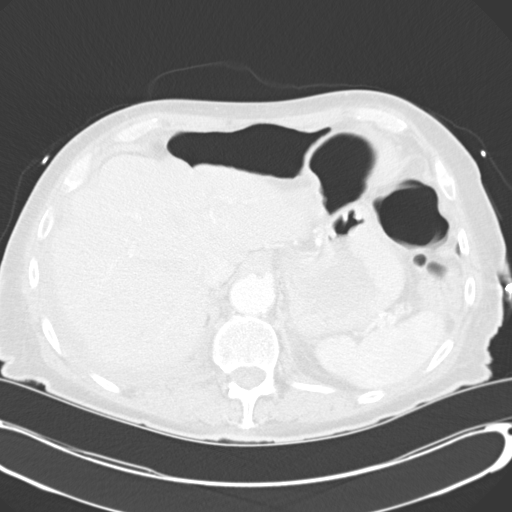
[im 23/76  lung]
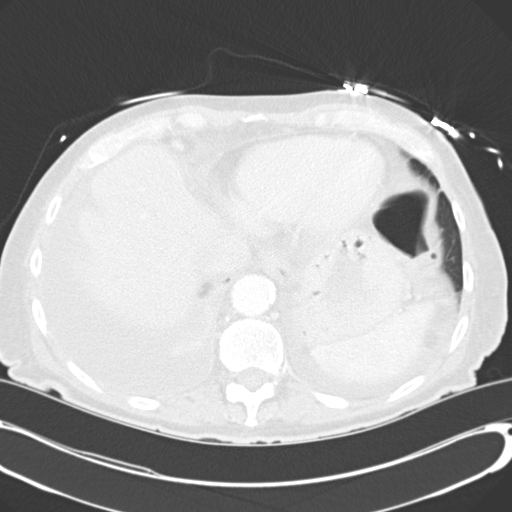
[im 28/76  mediastinal]
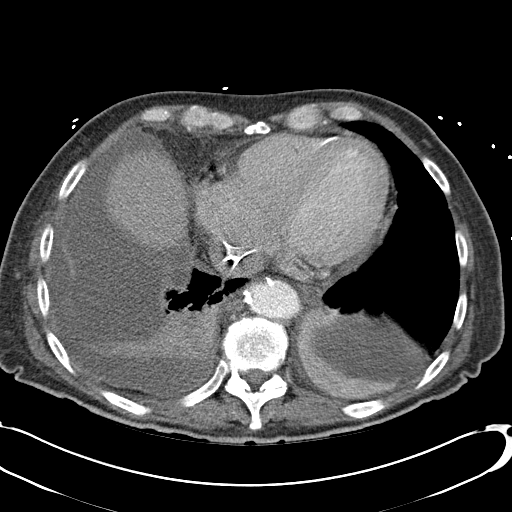
[im 28/76  lung]
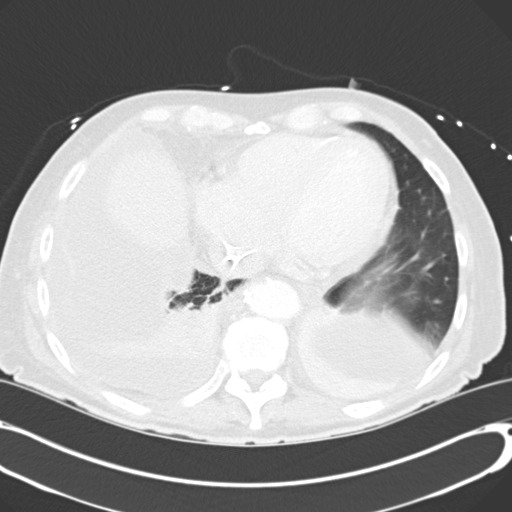
[im 34/76  lung]
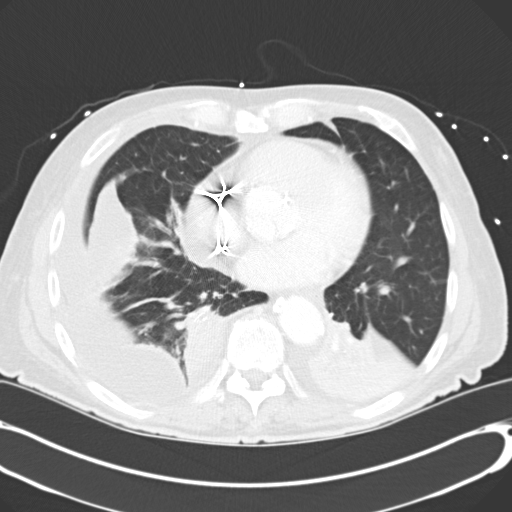
[im 42/76  lung]
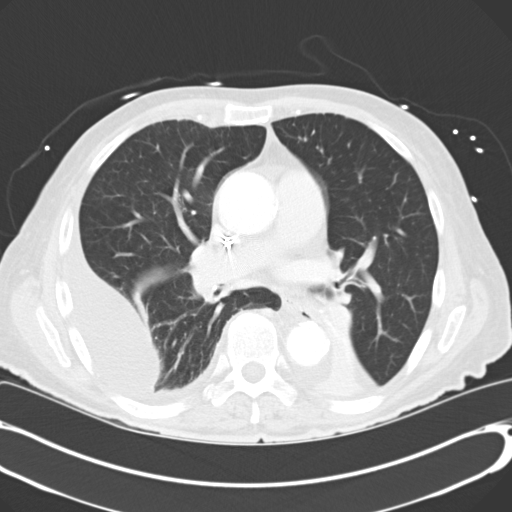
[im 48/76  lung]
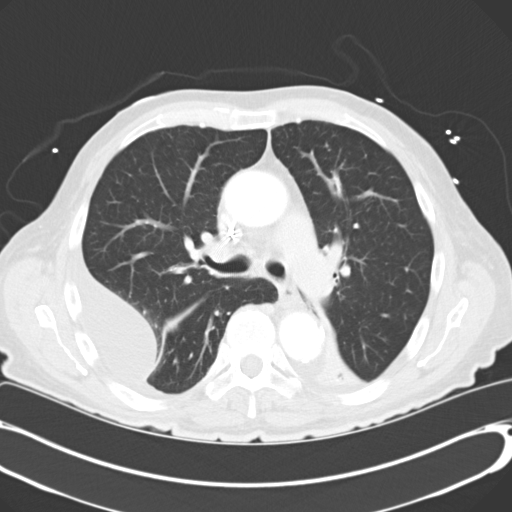
[im 53/76  mediastinal]
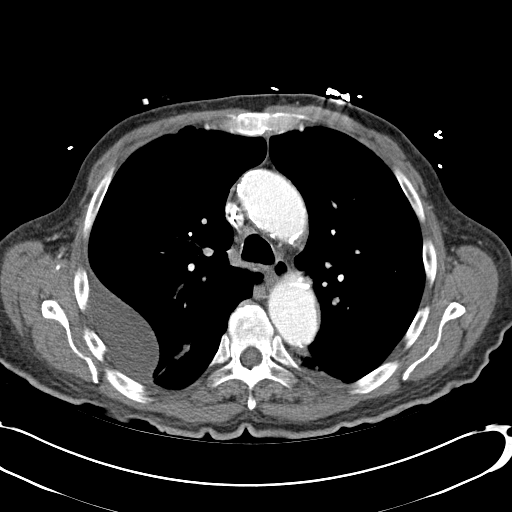
[im 53/76  lung]
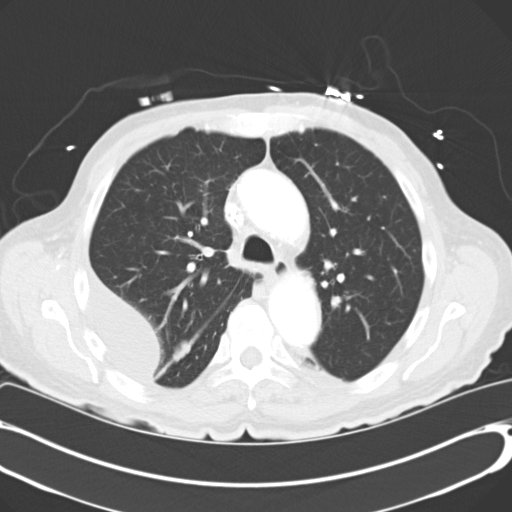
[im 59/76  lung]
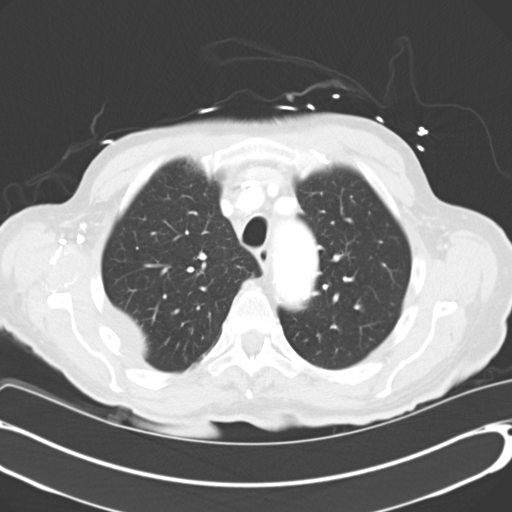
[im 64/76  lung]
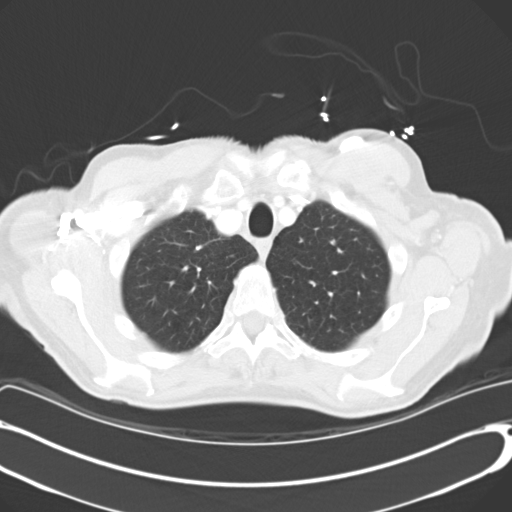
[im 70/76  lung]
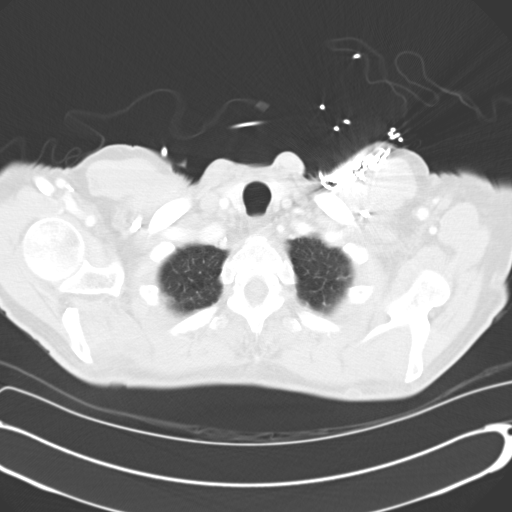

[Series 4: mpr coronal chest 3mm · coronal · 0.71mm/px · 3 of 87 slices shown]
[im 18/87  lung]
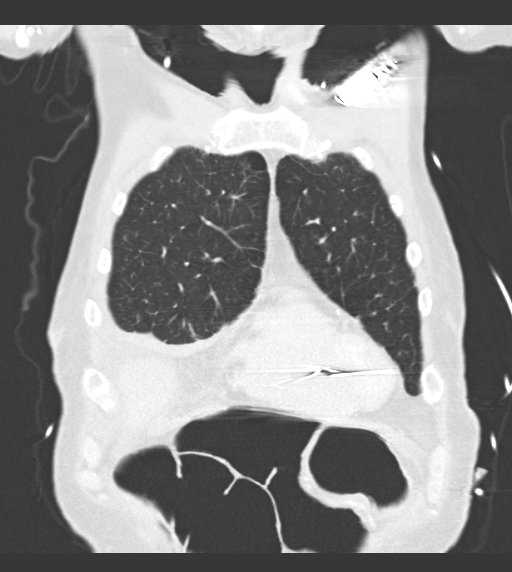
[im 35/87  lung]
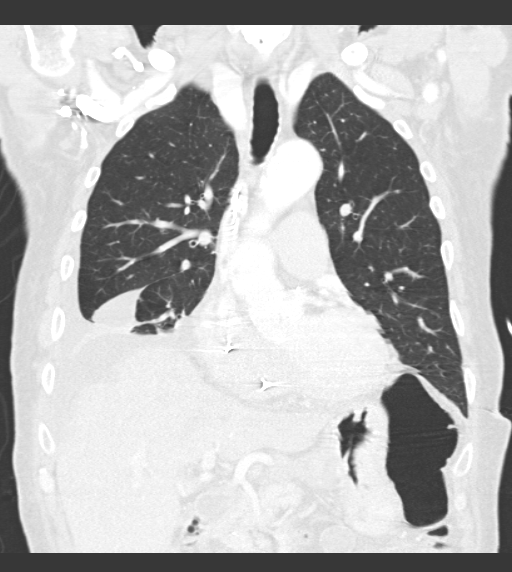
[im 52/87  lung]
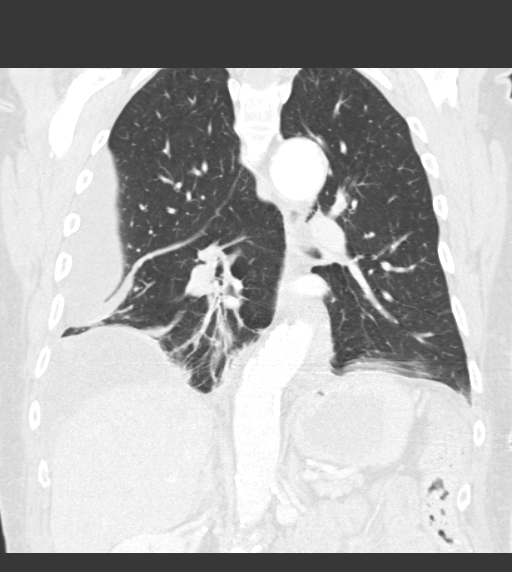

[15 of 36 positions shown; findings below may reference images not displayed]

FINDINGS: Mediastinum/Nodes: No axillary or supraclavicular lymphadenopathy.
No mediastinal hilar adenopathy. No pericardial fluid.

Lungs/Pleura: There is moderate volume loculated pleural fluid in
the right hemi thorax. Fluid extends along the oblique fissure.
There is associated lower lobe passive atelectasis. No pulmonary
edema.

There is a smaller loculated fluid in the left lower lobe associated
passive of the left lower lobe.

Upper lobes are clear.  No evidence of airspace disease.

Upper abdomen: Limited view of the liver, kidneys, pancreas are
unremarkable. Normal adrenal glands.

Musculoskeletal: No acute findings
IMPRESSION: With

1. Bilateral moderate loculated pleural effusions with bilateral
lower lobe atelectasis.
2. No evidence of pneumonia.

## 2016-02-01 IMAGING — CR DG CHEST 1V PORT
1 series · 1 of 1 positions shown · non-contrast
Comparison: 04/07/2014

CLINICAL DATA: Weakness, shortness of breath, hypotension

EXAM:
PORTABLE CHEST - 1 VIEW

[portable]
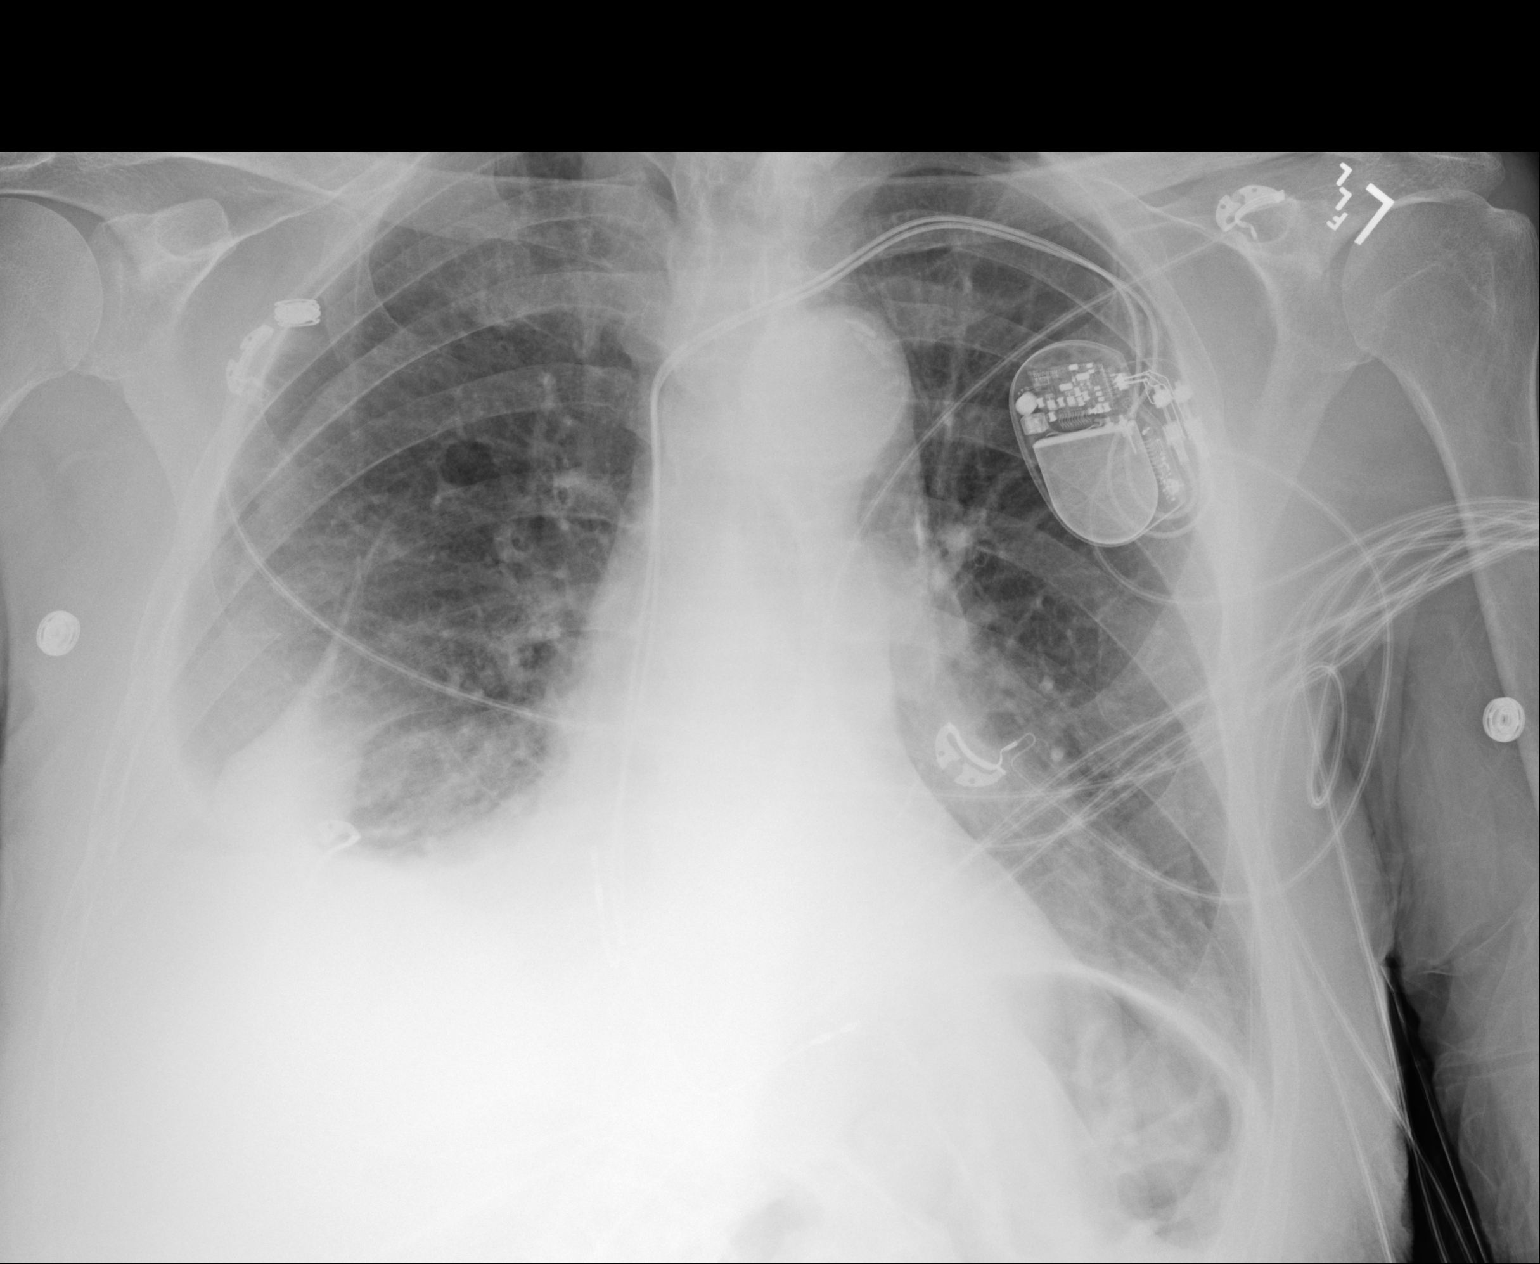

[1 of 1 positions shown; findings below may reference images not displayed]

FINDINGS: Increased partly loculated moderate right pleural effusion is
identified, obscuring the right lung base. Trace left pleural fluid
is present. No focal left-sided pulmonary opacity. Left-sided pacer
remains in place. Moderate enlargement of the cardiac silhouette and
aortic unfolding reidentified with central vascular congestion.
IMPRESSION: Increased now moderate partly loculated right pleural effusion
obscuring the lung base.

## 2016-03-15 ENCOUNTER — Ambulatory Visit (INDEPENDENT_AMBULATORY_CARE_PROVIDER_SITE_OTHER): Payer: Medicare Other | Admitting: Cardiovascular Disease

## 2016-03-15 ENCOUNTER — Encounter: Payer: Self-pay | Admitting: Cardiovascular Disease

## 2016-03-15 VITALS — BP 124/76 | HR 72 | Ht 69.0 in | Wt 158.2 lb

## 2016-03-15 DIAGNOSIS — I495 Sick sinus syndrome: Secondary | ICD-10-CM | POA: Diagnosis not present

## 2016-03-15 DIAGNOSIS — I480 Paroxysmal atrial fibrillation: Secondary | ICD-10-CM

## 2016-03-15 DIAGNOSIS — I82B12 Acute embolism and thrombosis of left subclavian vein: Secondary | ICD-10-CM | POA: Diagnosis not present

## 2016-03-15 DIAGNOSIS — Z950 Presence of cardiac pacemaker: Secondary | ICD-10-CM | POA: Diagnosis not present

## 2016-03-15 NOTE — Patient Instructions (Addendum)
Dr Sallyanne Kuster recommends that you continue on your current medications as directed. Please refer to the Current Medication list given to you today.  Remote monitoring is used to monitor your Pacemaker of ICD from home. This monitoring reduces the number of office visits required to check your device to one time per year. It allows [place] to keep an eye on the functioning of your device to ensure it is working properly. You are scheduled for a device check from home on Tuesday, May 1st, 2018. You may send your transmission at any time that day. If you have a wireless device, the transmission will be sent automatically. After your physician reviews your transmission, you will receive a postcard with your next transmission date.  Dr Sallyanne Kuster recommends that you schedule a follow-up appointment in 6 months with a pacemaker check. You will receive a reminder letter in the mail two months in advance. If you don't receive a letter, please call our office to schedule the follow-up appointment.  If you need a refill on your cardiac medications before your next appointment, please call your pharmacy.   KEEP LEFT ARM ELEVATED.

## 2016-03-15 NOTE — Progress Notes (Signed)
Patient ID: Cody George, male   DOB: [birth year], 83 y.o.   MRN: [id number]    Cardiology Office Note    Date:  03/15/2016   ID:  Cody George, DOB 1930/07/04, MRN [id number]  PCP:  Wende Neighbors, MD  Cardiologist:  Raliegh Ip. [name] HILTY, MD; Sanda Klein, MD   Chief Complaint  Patient presents with  . Follow-up    pt reports no complaints    History of Present Illness:  CEBASTIAN MENGHINI is a 83 y.o. male with a history of paroxysmal atrial fibrillation and sinus node arrest who received a dual-chamber pacemaker in 2013. He returns for routine follow-up and has no cardiac complaints. He specifically denies any palpitations and was unaware of the fact that he was in atrial fibrillation at the time of his last appointment. His overall burden of atrial fibrillation is around 27%. Ventricular rate control is generally good, typically in the 80-90 bpm range while in atrial fibrillation. He has 71% atrial pacing (virtually all the time that he has not In atrial fib) and only in T5 % ventricular pacing. His device has an expected generator longevity of another 5.6 years.  Occasional atrial undersensing is noted during his interrogation today and appropriate changes were made to device sensitivity.  He has not had any bleeding complications while on treatment with Eliquis. He started amiodarone on October 2 and then developed fatigue and weakness and sleep difficulty. We reduced the dose from 400 mg to 200 mg maintenance dose around November 1. He saw Dr. Debara Pickett on November 22 and was feeling poorly and had stopped the amiodarone already. Dr. Debara Pickett felt that depression may be playing a role in his complaints. Echo has been Scheduled for February 22.  He has "good days and bad days". This last Sunday was a particularly bad day. Comparison with his pacemaker log shows that he actually had an extended episode of atrial fibrillation lasting for 1 day and 8 hours coinciding with those symptoms. Also looking  back at the pacemaker log he had more than 2 days of uninterrupted atrial fibrillation) November 1 when he was feeling very poorly. It may be that his complaints were related to atrial fibrillation rather than side effects of amiodarone, although it is hard to tell for sure.  During rhythm today is atrial paced ventricular sensed. Feels well today and denies dyspnea, palpitations, dizziness or syncope. His mood seems to be improving.  He doess have isolated mild swelling in his left upper extremity and shows some evidence of collateral vein circulation on the anterior chest and shoulder.  Past Medical History:  Diagnosis Date  . Acute systolic congestive heart failure ([place]) 12/07/2015  . Arthritis   . Cellulitis   . Community acquired pneumonia 04/14/2014  . Depression 01/06/2016  . Dysrhythmia    PAF  . GERD (gastroesophageal reflux disease)   . History of nuclear stress test 10/05/2010   dipyridamole; normal pattern of perfusion in all regions; no significant ishcemia, low risk scan   . Hypertension   . OA (osteoarthritis)   . Pacemaker 07/22/2011   St. 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 Paroxysmal atrial fibrillation (HCC)   . Prostate cancer ([place])    radiation  . Shortness of breath     Past Surgical History:  Procedure Laterality Date  . CARDIOVERSION N/A 12/07/2015   Procedure: CARDIOVERSION;  Surgeon: Pixie Casino, MD;  Location: [facility];  Service: Cardiovascular;  Laterality:  N/A;  . HERNIA REPAIR Bilateral   . INGUINAL HERNIA REPAIR Left 06/10/2015   Procedure: HERNIA REPAIR INGUINAL ADULT WITH MESH;  Surgeon: Aviva Signs, MD;  Location: [facility];  Service: General;  Laterality: Left;  Pt notified to arrive at 6:15am KF  . NM MYOCAR PERF WALL MOTION  10/05/10   normal  . PACEMAKER INSERTION  07/22/2011   St. 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 PERMANENT PACEMAKER INSERTION N/A  07/22/2011   Procedure: PERMANENT PACEMAKER INSERTION;  Surgeon: Sanda Klein, MD;  Location: [facility] CATH [facility];  Service: Cardiovascular;  Laterality: N/A;  . PILONIDAL CYST DRAINAGE     x2  . TEE WITHOUT CARDIOVERSION N/A 12/07/2015   Procedure: TRANSESOPHAGEAL ECHOCARDIOGRAM (TEE);  Surgeon: Pixie Casino, MD;  Location: [facility] ENDOSCOPY;  Service: Cardiovascular;  Laterality: N/A;  . TRANSTHORACIC ECHOCARDIOGRAM  10/05/2010   EF=50-55%, normal LV systolic function; LA & RA mildly dilated; mild MR; mild TR with RV systolic pressure elevted at 30-61mmHg; AV mildly sclerotic with mild calcif of AV leaflets, mild valvular AS, mild-mod regurg; trace pulm valve regurg  . US ECHOCARDIOGRAPHY  10/05/10   LA mildly dilated, mild TR, mild ca+ of AOV ,mild to mod. AI    Outpatient Medications Prior to Visit  Medication Sig Dispense Refill  . ELIQUIS 5 MG TABS tablet TAKE ONE TABLET TWICE DAILY 60 tablet 5  . ferrous sulfate 325 (65 FE) MG EC tablet Take 325 mg by mouth daily with breakfast.    . furosemide (LASIX) 40 MG tablet Take 1 tablet (40 mg total) by mouth daily. 30 tablet 6  . metoprolol succinate (TOPROL-XL) 50 MG 24 hr tablet TAKE ONE (1) TABLET BY MOUTH EVERY DAY TAKE WITH OR IMMEDIATELY FOLLOWING A MEAL 90 tablet 3  . tamsulosin (FLOMAX) 0.4 MG CAPS capsule Take 0.4 mg by mouth daily.    . vitamin B-12 (CYANOCOBALAMIN) 1000 MCG tablet Take 1,000 mcg by mouth daily.    Marland Kitchen VITAMIN C, CALCIUM ASCORBATE, PO Take 1,000 mg by mouth daily.     Marland Kitchen VITAMIN D, CHOLECALCIFEROL, PO Take 1,000 mg by mouth daily.      No facility-administered medications prior to visit.      Allergies:   Patient has no known allergies.   Social History   Social History  . Marital status: Married    Spouse name: N/A  . Number of children: 5  . Years of education: N/A   Occupational History  .  Retired   Social History Main Topics  . Smoking status: Current Every Day Smoker    Packs/day: 0.50    Years: 65.00  .  Smokeless tobacco: Never Used     Comment: now 1/2 ppd (07/09/14)  . Alcohol use No  . Drug use: No  . Sexual activity: Not Currently   Other Topics Concern  . Not on file   Social History Narrative  . No narrative on file       ROS:   Please see the history of present illness.    ROS All other systems reviewed and are negative.   PHYSICAL EXAM:   VS:  BP 124/76   Pulse 72   Ht 5\' 9"  (1.753 m)   Wt 71.8 kg (158 lb 3.2 oz)   BMI 23.36 kg/m    GEN: Well nourished, well developed, in no acute distress  HEENT: normal  Neck: no JVD, carotid bruits, or masses Cardiac: irregular; no murmurs,  rubs, or gallops,no edema , LC pacemaker site Respiratory:  clear to auscultation bilaterally, normal work of breathing GI: soft, nontender, nondistended, + BS MS: no deformity or atrophy . Left arm and forearm are very slightly larger in caliber than the contralateral extremity. No overt edema. Mild collateral vein formation over the left shoulder and left anterior chest. Skin: warm and dry, no rash Neuro:  Alert and Oriented x 3, Strength and sensation are intact Psych: euthymic mood, full affect  Wt Readings from Last 3 Encounters:  03/15/16 71.8 kg (158 lb 3.2 oz)  01/06/16 71.2 kg (157 lb)  11/16/15 73.4 kg (161 lb 12.8 oz)      Studies/Labs Reviewed:   EKG:  EKG is not ordered today.   Recent Labs: 11/04/2015: ALT 15; Brain Natriuretic Peptide 392.5 12/01/2015: BUN 16; Creat 0.87; Hemoglobin 14.6; Platelets 213; Potassium 4.6; Sodium 137; TSH 5.33   Lipid Panel    Component Value Date/Time   CHOL 131 12/21/2012 0820   TRIG 68 12/21/2012 0820   HDL 53 12/21/2012 0820   CHOLHDL 2.5 12/21/2012 0820   VLDL 14 12/21/2012 0820   LDLCALC 64 12/21/2012 0820      ASSESSMENT:    1. Paroxysmal a-fib (HCC)   2. SSS (sick sinus syndrome) ([place])   3. 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 Pacemaker      PLAN:  In order of problems listed above:  1. AFIB:   adequate rate control, on appropriate anticoagulants for bleeding or embolic complications. She does appear to have more symptoms on days when he has ongoing episodes of paroxysmal atrial fibrillation. Hard to say whether the troublesome complaints he had were related to the arrhythmia or the amiodarone. In terms of depression also make it harder to make an assessment. At this point I think we can forego antiarrhythmics but I have asked him to keep a detailed log of his "bad days" and we will compare them with his pacemaker record. 2. SSS: pacemaker implanted 2013. 3. LUE venous obstruction: He has evidence of modest swelling of the left upper extremity and some collateral vein formation. I suspect that he has an occlusion of the left subclavian vein due to the pacemaker leads, possibly superimposed thrombus. He is already on a potent anticoagulant and I don't think it will make much difference whether or not we confirmed this with ultrasonography. I have asked him to try to keep the left arm elevated on an extra pillow when he sits in a chair. 4. remote pacemaker follow-up via the Export system every 3 months, will see him back in 6 months..    Medication Adjustments/Labs and Tests Ordered: Current medicines are reviewed at length with the patient today.  Concerns regarding medicines are outlined above.  Medication changes, Labs and Tests ordered today are listed in the Patient Instructions below. Patient Instructions  Dr Sallyanne Kuster recommends that you continue on your current medications as directed. Please refer to the Current Medication list given to you today.  Remote monitoring is used to monitor your Pacemaker of ICD from home. This monitoring reduces the number of office visits required to check your device to one time per year. It allows [place] to keep an eye on the functioning of your device to ensure it is working properly. You are scheduled for a device check from home on Tuesday, May 1st, 2018. You  may send your transmission at any time that day. If you have a wireless device, the transmission will  be sent automatically. After your physician reviews your transmission, you will receive a postcard with your next transmission date.  Dr Sallyanne Kuster recommends that you schedule a follow-up appointment in 6 months with a pacemaker check. You will receive a reminder letter in the mail two months in advance. If you don't receive a letter, please call our office to schedule the follow-up appointment.  If you need a refill on your cardiac medications before your next appointment, please call your pharmacy.   KEEP LEFT ARM ELEVATED.      Signed, Sanda Klein, MD  03/15/2016 9:22 AM    [facility] [place], [place], [place]  [address] Phone: [phone]; Fax: [phone]

## 2016-03-25 LAB — CUP PACEART INCLINIC DEVICE CHECK
Implantable Lead Implant Date: 20130607
Implantable Pulse Generator Implant Date: 20130607
Lead Channel Setting Pacing Amplitude: 2.5 V
Lead Channel Setting Pacing Amplitude: 2.5 V
Lead Channel Setting Sensing Sensitivity: 2 mV
MDC IDC LEAD IMPLANT DT: 20130607
MDC IDC LEAD LOCATION: 753859
MDC IDC LEAD LOCATION: 753860
MDC IDC PG SERIAL: 7353349
MDC IDC SESS DTM: 20180209094125
MDC IDC SET LEADCHNL RV PACING PULSEWIDTH: 0.5 ms

## 2016-03-29 DIAGNOSIS — N320 Bladder-neck obstruction: Secondary | ICD-10-CM | POA: Diagnosis not present

## 2016-04-05 ENCOUNTER — Ambulatory Visit (INDEPENDENT_AMBULATORY_CARE_PROVIDER_SITE_OTHER): Payer: Medicare Other | Admitting: Urology

## 2016-04-05 DIAGNOSIS — C61 Malignant neoplasm of prostate: Secondary | ICD-10-CM | POA: Diagnosis not present

## 2016-04-07 ENCOUNTER — Ambulatory Visit (HOSPITAL_COMMUNITY)
Admission: RE | Admit: 2016-04-07 | Discharge: 2016-04-07 | Disposition: A | Discharge status: Home / Self Care | Payer: Medicare Other | Source: Ambulatory Visit | Attending: Internal Medicine | Admitting: Internal Medicine

## 2016-04-07 DIAGNOSIS — I351 Nonrheumatic aortic (valve) insufficiency: Secondary | ICD-10-CM | POA: Insufficient documentation

## 2016-04-07 DIAGNOSIS — I428 Other cardiomyopathies: Secondary | ICD-10-CM | POA: Insufficient documentation

## 2016-04-07 DIAGNOSIS — I058 Other rheumatic mitral valve diseases: Secondary | ICD-10-CM | POA: Diagnosis not present

## 2016-04-07 NOTE — Progress Notes (Signed)
*  PRELIMINARY RESULTS* Echocardiogram 2D Echocardiogram has been performed.  Cody George 04/07/2016, 10:18 AM

## 2016-04-08 DIAGNOSIS — L570 Actinic keratosis: Secondary | ICD-10-CM | POA: Diagnosis not present

## 2016-04-08 DIAGNOSIS — L400 Psoriasis vulgaris: Secondary | ICD-10-CM | POA: Diagnosis not present

## 2016-04-13 ENCOUNTER — Ambulatory Visit (INDEPENDENT_AMBULATORY_CARE_PROVIDER_SITE_OTHER): Payer: Medicare Other | Admitting: Internal Medicine

## 2016-04-13 ENCOUNTER — Encounter: Payer: Self-pay | Admitting: Internal Medicine

## 2016-04-13 VITALS — BP 146/84 | HR 76 | Ht 69.0 in | Wt 161.2 lb

## 2016-04-13 DIAGNOSIS — I5022 Chronic systolic (congestive) heart failure: Secondary | ICD-10-CM | POA: Diagnosis not present

## 2016-04-13 DIAGNOSIS — I495 Sick sinus syndrome: Secondary | ICD-10-CM | POA: Diagnosis not present

## 2016-04-13 DIAGNOSIS — I481 Persistent atrial fibrillation: Secondary | ICD-10-CM

## 2016-04-13 DIAGNOSIS — I82622 Acute embolism and thrombosis of deep veins of left upper extremity: Secondary | ICD-10-CM | POA: Insufficient documentation

## 2016-04-13 DIAGNOSIS — M7989 Other specified soft tissue disorders: Secondary | ICD-10-CM | POA: Diagnosis not present

## 2016-04-13 DIAGNOSIS — I4819 Other persistent atrial fibrillation: Secondary | ICD-10-CM

## 2016-04-13 HISTORY — DX: Acute embolism and thrombosis of deep veins of left upper extremity: I82.622

## 2016-04-13 NOTE — Patient Instructions (Signed)
Medication Instructions:  Your physician recommends that you continue on your current medications as directed. Please refer to the Current Medication list given to you today.  If you need a refill on your cardiac medications before your next appointment, please call your pharmacy.  Testing/Procedures: Your physician has requested that you have a lower or upper extremity venous duplex. This test is an ultrasound of the veins in the legs or arms. It looks at venous blood flow that carries blood from the heart to the legs or arms. Allow one hour for a Lower Venous exam. Allow thirty minutes for an Upper Venous exam. There are no restrictions or special instructions.   Follow-Up: Your physician recommends that you schedule a follow-up appointment in: [place]   Thank you for choosing [facility] at [facility]!!    Lyman Bishop, MD Sharyn Lull, LPN

## 2016-04-13 NOTE — Progress Notes (Signed)
OFFICE NOTE  Chief Complaint:  Feels much better today  Primary Care Physician: Wende Neighbors, MD  HPI:  Cody George is an 83 year old gentleman who has a history of atrial fibrillation and tachy-brady syndrome as well as paroxysmal atrial fibrillation and sinus node arrest. He underwent a St. Jude Accent DR dual-chamber pacemaker in June of 2013. He has done fairly well other than he had some hypotension on lisinopril which was discontinued. He has recently had some tachyarrhythmias and had increased his metoprolol succinate to 25 mg daily due to tachyarrhythmias. He is enrolled in a home monitoring service with the [facility] patient care network. He was successfully transitioned over to Eliquis she is taking without any bleeding difficulty. He denies any shortness of breath or worsening chest pain. He is due for a remote pacer check in a few days.  Cody George returns for follow-up today. He is feeling well denies any chest pain or worsening shortness of breath. He has no palpitations. Blood pressure is well controlled. He had recent bilateral cataract surgery and is seeing much better. There are no bleeding issues.  I saw Cody George back in the office today for follow-up. Tells me a few months ago he had pneumonia and has been very slow to recover. Chest x-ray shows improvement however there are changes consistent with COPD. He has a greater than 65 year history of smoking. I do not believe he carries a diagnosis of COPD and is currently not on any medications for this. He reports some worsening shortness of breath as well has fatigue. He denies any particular chest pain. He does have valvular heart disease and history of aortic stenosis. Recent echo in March shows some worsening of valvular function with now moderate aortic stenosis.  Cody George returns today for follow-up. Overall he is without complaints. He scheduled to see Dr. Loletha Grayer in the office next week for follow-up of his pacemaker.  He is atrially paced today. He had a low burden of atrial fibrillation but remains on Eliquis. He is asymptomatic with this. He's had no bleeding problems on Eliquis. Blood pressure is high normal today 152/82. He says that generally is between [id number] and [id number] systolic at home. I reminded him that his prior echocardiogram showed what may be moderate aortic stenosis. Interestingly, I have not been able to auscultate that on exam. His prior echo was almost a year ago and will be due for repeat echo this year.  11/04/2015  Cody George was seen today in the office. He reports that over the past several months she's had a significant decline. He has had worsening energy and fatigue as well as shortness of breath. He can only walk short distances before becoming significantly short of breath. He is also developed a mildly productive cough with a tannish sputum. He is concerned about whether he might have had recurrent pneumonia. He does have a history of COPD and pneumonia in the past. An echocardiogram was performed and March 2017 which showed mild to moderate aortic stenosis by valve gradient however visually the aortic valve appeared to be moderate to severely stenosed. On exam is very difficult with his COPD and hyperexpansion to auscultate his aortic murmur and I cannot reliably hear the second heart sound. Cody George appeared to be short of breath today and was unable to finish sentences at times. He denies any fevers or chills at home. He has had some mild recent weight loss. He also complains of lower extremity swelling,  which is worse on the left leg than the right. There is some orthopnea.  11/16/2015  Cody George returns today for follow-up. He reports feeling much better. His weight has come down several pounds and he notes marked improvement in his edema. His shortness of breath is also improved and he feels much more energized. Unfortunately his echocardiogram shows a newly reduced LVEF of 20-25% with  global hypokinesis. 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 His chest x-ray showed no evidence of pneumonia.  01/06/2016  Cody George was seen today in follow-up. He underwent cardioversion after loading with amiodarone which required 3 shocks but ultimately he converted back to sinus rhythm. Today he is in an atrial paced rhythm but appears to be maintaining sinus. Unfortunately he feels "horrible". It's not clear why he feels worse today but has felt better in the past. He says some days are good and some days are bad. He also became very tearful in the office today. He is still quite upset about the death of a family member last year. I'm concerned that he is struggling from depression. He does not appear volume overloaded on exam. Now that he is back in sinus rhythm, his cardiac output should be better but he still reports fatigue, poor sleep at night, lack of interest in things, tearfulness and other symptoms which may be more likely to be depression.  04/13/2016  Cody George returns today for follow-up. He is doing markedly better. His color is improved significantly and so is his attitude. He has generally maintained a sinus rhythm since cardioversion on 12/07/2015. He was on amiodarone but had significant side effects with this. The medication was then wean down and discontinued. His overall burden of atrial fibrillation was about 27%. In general it seems like his A. fib may be playing a role in his fatigue. He recently has been describing some left upper extremity swelling. He saw Dr. Loletha Grayer, who felt that this could be occlusion of the left subclavian vein related to the pacemaker leads with possibly super impose thrombus. Today he reports is been no significant change in his swelling despite being on Eliquis for more than 3 months. He has been trying to keep his arm  elevated.  PMHx:  Past Medical History:  Diagnosis Date  . Acute systolic congestive heart failure ([place]) 12/07/2015  . Arthritis   . Cellulitis   . Community acquired pneumonia 04/14/2014  . Depression 01/06/2016  . Dysrhythmia    PAF  . GERD (gastroesophageal reflux disease)   . History of nuclear stress test 10/05/2010   dipyridamole; normal pattern of perfusion in all regions; no significant ishcemia, low risk scan   . Hypertension   . OA (osteoarthritis)   . Pacemaker 07/22/2011   St. 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 Paroxysmal atrial fibrillation (HCC)   . Prostate cancer ([place])    radiation  . Shortness of breath     Past Surgical History:  Procedure Laterality Date  . CARDIOVERSION N/A 12/07/2015   Procedure: CARDIOVERSION;  Surgeon: Pixie Casino, MD;  Location: [facility] ENDOSCOPY;  Service: Cardiovascular;  Laterality: N/A;  . HERNIA REPAIR Bilateral   . INGUINAL HERNIA REPAIR Left 06/10/2015   Procedure: HERNIA REPAIR INGUINAL ADULT WITH MESH;  Surgeon: Aviva Signs, MD;  Location: [facility];  Service: General;  Laterality: Left;  Pt notified to arrive at  6:15am KF  . NM MYOCAR PERF WALL MOTION  10/05/10   normal  . PACEMAKER INSERTION  07/22/2011   St. 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 PERMANENT PACEMAKER INSERTION N/A 07/22/2011   Procedure: PERMANENT PACEMAKER INSERTION;  Surgeon: Sanda Klein, MD;  Location: [facility] CATH LAB;  Service: Cardiovascular;  Laterality: N/A;  . PILONIDAL CYST DRAINAGE     x2  . TEE WITHOUT CARDIOVERSION N/A 12/07/2015   Procedure: TRANSESOPHAGEAL ECHOCARDIOGRAM (TEE);  Surgeon: Pixie Casino, MD;  Location: [facility] ENDOSCOPY;  Service: Cardiovascular;  Laterality: N/A;  . TRANSTHORACIC ECHOCARDIOGRAM  10/05/2010   EF=50-55%, normal LV systolic function; LA & RA mildly dilated; mild MR; mild TR with RV systolic pressure elevted at 30-74mmHg; AV mildly sclerotic  with mild calcif of AV leaflets, mild valvular AS, mild-mod regurg; trace pulm valve regurg  . US ECHOCARDIOGRAPHY  10/05/10   LA mildly dilated, mild TR, mild ca+ of AOV ,mild to mod. AI    FAMHx:  No family history on file.  SOCHx:   reports that he has been smoking.  He has a 32.50 pack-year smoking history. He has never used smokeless tobacco. He reports that he does not drink alcohol or use drugs.  ALLERGIES:  No Known Allergies  ROS: Pertinent items noted in HPI and remainder of comprehensive ROS otherwise negative.  HOME MEDS: Current Outpatient Prescriptions  Medication Sig Dispense Refill  . ELIQUIS 5 MG TABS tablet TAKE ONE TABLET TWICE DAILY 60 tablet 5  . furosemide (LASIX) 40 MG tablet Take 1 tablet (40 mg total) by mouth daily. 30 tablet 6  . metoprolol succinate (TOPROL-XL) 50 MG 24 hr tablet TAKE ONE (1) TABLET BY MOUTH EVERY DAY TAKE WITH OR IMMEDIATELY FOLLOWING A MEAL 90 tablet 3  . tamsulosin (FLOMAX) 0.4 MG CAPS capsule Take 0.4 mg by mouth daily.    . vitamin B-12 (CYANOCOBALAMIN) 1000 MCG tablet Take 1,000 mcg by mouth daily.    Marland Kitchen VITAMIN C, CALCIUM ASCORBATE, PO Take 1,000 mg by mouth daily.     Marland Kitchen VITAMIN D, CHOLECALCIFEROL, PO Take 1,000 mg by mouth daily.      No current facility-administered medications for this visit.     LABS/IMAGING: No results found for this or any previous visit (from the past 48 hour(s)). No results found.  VITALS: BP (!) 146/84   Pulse 76   Ht 5\' 9"  (1.753 m)   Wt 161 lb 3.2 oz (73.1 kg)   BMI 23.81 kg/m   EXAM: General appearance: alert and no distress Neck: no carotid bruit and no JVD Lungs: diminished breath sounds bilaterally Heart: irregularly irregular rhythm, S1, S2 normal, systolic murmur: systolic ejection 2/6, crescendo at 2nd right intercostal space and possibly a diastolic mumur Abdomen: soft, non-tender; bowel sounds normal; no masses,  no organomegaly Extremities: edema LUE edema Pulses: 2+ and  symmetric Skin: Skin color, texture, turgor normal. No rashes or lesions Neurologic: Grossly normal Psych: Pleasant  EKG: Atrial paced rhythm at 76, nonspecific ST and T-wave changes  ASSESSMENT: 1. Persistent UE swelling, despite Eliquis - ?pacer related chronic venous thrombus? 2. Recent acute systolic congestive heart failure, LVEF 20-25% (improved to 40% - 2018) 3. Persistent atrial fibrillation - s/p DCCV (intolerant to amiodarone) 4. Status post St. Jude Accent DR pacemaker for sinus arrest 5. Labile hypertension - controlled 6. Chronic anticoagulation on Eliquis 7. Fatigue and dyspnea 8. COPD 9. Aortic stenosis - at least moderate if not severe  by echo 04/2015  PLAN: 1.   Cody George continues to have persistent left upper extremity swelling which could be pacemaker related chronic venous thrombus or perhaps some other mechanism causing venous compression. While he is on adequate therapy with Eliquis, I think it would be helpful to have a diagnosis and that would negate any other possible mechanisms such as tumor or compressive adenopathy.  I will plan to obtain a left upper extremity ultrasound. He appears to be doing well and fairly euvolemic on his current dose of Lasix which we'll continue. He is not currently on antiarrhythmics but his burden of atrial fibrillation is lower since his recent cardioversion. He has some short episodes of recurrence. Dr. Loletha Grayer did not feel that ongoing antiarrhythmic therapy was justified and certainly he is intolerant to amiodarone. The kidneys is an EF has improved up to 40%. Suggesting that the mechanism is likely to related to A. fib with rapid ventricular response.  Plan follow-up in 6 months for ongoing monitoring of his heart failure and aortic stenosis.  Pixie Casino, MD, [facility] Attending Cardiologist Kemp C Hilty 04/13/2016, 9:53 AM

## 2016-04-14 ENCOUNTER — Other Ambulatory Visit: Payer: Self-pay | Admitting: Cardiovascular Disease

## 2016-04-15 ENCOUNTER — Ambulatory Visit (HOSPITAL_COMMUNITY)
Admission: RE | Admit: 2016-04-15 | Discharge: 2016-04-15 | Disposition: A | Discharge status: Home / Self Care | Payer: Medicare Other | Source: Ambulatory Visit | Attending: Cardiovascular Disease | Admitting: Cardiovascular Disease

## 2016-04-15 DIAGNOSIS — M7989 Other specified soft tissue disorders: Secondary | ICD-10-CM | POA: Insufficient documentation

## 2016-04-15 NOTE — Telephone Encounter (Signed)
Rx(s) sent to pharmacy electronically.  

## 2016-06-04 ENCOUNTER — Encounter: Payer: Self-pay | Admitting: Cardiovascular Disease

## 2016-06-14 ENCOUNTER — Ambulatory Visit (INDEPENDENT_AMBULATORY_CARE_PROVIDER_SITE_OTHER): Payer: Medicare Other | Admitting: *Deleted

## 2016-06-14 DIAGNOSIS — I495 Sick sinus syndrome: Secondary | ICD-10-CM | POA: Diagnosis not present

## 2016-06-14 NOTE — Progress Notes (Signed)
Remote pacemaker transmission.   

## 2016-06-15 ENCOUNTER — Encounter: Payer: Self-pay | Admitting: Cardiology

## 2016-06-15 LAB — CUP PACEART REMOTE DEVICE CHECK
Brady Statistic AP VP Percent: 1 %
Brady Statistic AP VS Percent: 99 %
Brady Statistic AS VP Percent: 1 %
Brady Statistic AS VS Percent: 1 %
Brady Statistic RA Percent Paced: 66 %
Implantable Lead Implant Date: 20130607
Lead Channel Impedance Value: 480 Ohm
Lead Channel Pacing Threshold Amplitude: 0.875 V
Lead Channel Pacing Threshold Amplitude: 1.25 V
Lead Channel Pacing Threshold Pulse Width: 0.4 ms
Lead Channel Sensing Intrinsic Amplitude: 12 mV
Lead Channel Setting Pacing Amplitude: 2.5 V
Lead Channel Setting Pacing Amplitude: 2.5 V
Lead Channel Setting Pacing Pulse Width: 0.5 ms
MDC IDC LEAD IMPLANT DT: 20130607
MDC IDC LEAD LOCATION: 753859
MDC IDC LEAD LOCATION: 753860
MDC IDC MSMT BATTERY REMAINING LONGEVITY: 68 mo
MDC IDC MSMT BATTERY REMAINING PERCENTAGE: 73 %
MDC IDC MSMT BATTERY VOLTAGE: 2.92 V
MDC IDC MSMT LEADCHNL RA IMPEDANCE VALUE: 450 Ohm
MDC IDC MSMT LEADCHNL RA PACING THRESHOLD PULSEWIDTH: 0.4 ms
MDC IDC MSMT LEADCHNL RA SENSING INTR AMPL: 4.5 mV
MDC IDC PG IMPLANT DT: 20130607
MDC IDC PG SERIAL: 7353349
MDC IDC SESS DTM: 20180501095650
MDC IDC SET LEADCHNL RV SENSING SENSITIVITY: 2 mV
MDC IDC STAT BRADY RV PERCENT PACED: 29 %

## 2016-07-06 ENCOUNTER — Ambulatory Visit (HOSPITAL_COMMUNITY)
Admission: RE | Admit: 2016-07-06 | Discharge: 2016-07-06 | Disposition: A | Discharge status: Home / Self Care | Payer: Medicare Other | Source: Ambulatory Visit | Attending: Internal Medicine | Admitting: Internal Medicine

## 2016-07-06 ENCOUNTER — Other Ambulatory Visit (HOSPITAL_COMMUNITY): Payer: Self-pay | Admitting: Internal Medicine

## 2016-07-06 DIAGNOSIS — R5383 Other fatigue: Secondary | ICD-10-CM | POA: Diagnosis not present

## 2016-07-06 DIAGNOSIS — R531 Weakness: Secondary | ICD-10-CM | POA: Diagnosis not present

## 2016-07-06 DIAGNOSIS — I2510 Atherosclerotic heart disease of native coronary artery without angina pectoris: Secondary | ICD-10-CM | POA: Insufficient documentation

## 2016-07-06 DIAGNOSIS — R0602 Shortness of breath: Secondary | ICD-10-CM

## 2016-07-06 DIAGNOSIS — R918 Other nonspecific abnormal finding of lung field: Secondary | ICD-10-CM | POA: Diagnosis not present

## 2016-07-06 DIAGNOSIS — N200 Calculus of kidney: Secondary | ICD-10-CM | POA: Diagnosis not present

## 2016-07-06 DIAGNOSIS — I700 Atherosclerosis of aorta: Secondary | ICD-10-CM | POA: Insufficient documentation

## 2016-07-06 DIAGNOSIS — M545 Low back pain: Secondary | ICD-10-CM | POA: Diagnosis not present

## 2016-07-06 DIAGNOSIS — I712 Thoracic aortic aneurysm, without rupture: Secondary | ICD-10-CM | POA: Diagnosis not present

## 2016-07-06 DIAGNOSIS — N281 Cyst of kidney, acquired: Secondary | ICD-10-CM | POA: Insufficient documentation

## 2016-07-08 DIAGNOSIS — I10 Essential (primary) hypertension: Secondary | ICD-10-CM | POA: Diagnosis not present

## 2016-07-13 DIAGNOSIS — Z0000 Encounter for general adult medical examination without abnormal findings: Secondary | ICD-10-CM | POA: Diagnosis not present

## 2016-07-13 DIAGNOSIS — I482 Chronic atrial fibrillation: Secondary | ICD-10-CM | POA: Diagnosis not present

## 2016-07-13 DIAGNOSIS — J439 Emphysema, unspecified: Secondary | ICD-10-CM | POA: Diagnosis not present

## 2016-07-13 DIAGNOSIS — J189 Pneumonia, unspecified organism: Secondary | ICD-10-CM | POA: Diagnosis not present

## 2016-08-01 ENCOUNTER — Other Ambulatory Visit: Payer: Self-pay | Admitting: Internal Medicine

## 2016-09-13 ENCOUNTER — Ambulatory Visit (INDEPENDENT_AMBULATORY_CARE_PROVIDER_SITE_OTHER): Payer: Medicare Other | Admitting: *Deleted

## 2016-09-13 DIAGNOSIS — I495 Sick sinus syndrome: Secondary | ICD-10-CM

## 2016-09-13 NOTE — Progress Notes (Signed)
Remote pacemaker transmission.   

## 2016-09-14 ENCOUNTER — Ambulatory Visit (INDEPENDENT_AMBULATORY_CARE_PROVIDER_SITE_OTHER): Payer: Medicare Other | Admitting: Cardiovascular Disease

## 2016-09-14 ENCOUNTER — Encounter: Payer: Self-pay | Admitting: Cardiovascular Disease

## 2016-09-14 VITALS — BP 141/82 | HR 97 | Ht 69.0 in | Wt 160.0 lb

## 2016-09-14 DIAGNOSIS — I495 Sick sinus syndrome: Secondary | ICD-10-CM | POA: Diagnosis not present

## 2016-09-14 DIAGNOSIS — Z950 Presence of cardiac pacemaker: Secondary | ICD-10-CM

## 2016-09-14 DIAGNOSIS — I481 Persistent atrial fibrillation: Secondary | ICD-10-CM

## 2016-09-14 DIAGNOSIS — I82A22 Chronic embolism and thrombosis of left axillary vein: Secondary | ICD-10-CM

## 2016-09-14 DIAGNOSIS — I4819 Other persistent atrial fibrillation: Secondary | ICD-10-CM

## 2016-09-14 DIAGNOSIS — I5022 Chronic systolic (congestive) heart failure: Secondary | ICD-10-CM

## 2016-09-14 DIAGNOSIS — Z7901 Long term (current) use of anticoagulants: Secondary | ICD-10-CM | POA: Diagnosis not present

## 2016-09-14 NOTE — Progress Notes (Signed)
Patient ID: Cody George, male   DOB: [birth year], 83 y.o.   MRN: [id number]    Cardiology Office Note    Date:  09/14/2016   ID:  Cody George, DOB 1930/03/31, MRN [id number]  PCP:  Celene Squibb, MD  Cardiologist:  Raliegh Ip. [name] HILTY, MD; Sanda Klein, MD   Chief Complaint  Patient presents with  . Follow-up    6 months;    History of Present Illness:  Cody George is a 83 y.o. male with a history of paroxysmal atrial fibrillation and sinus node arrest who received a dual-chamber pacemaker in 2013.   He returns for routine follow-up and has no cardiac complaints. He specifically denies any palpitations and, as before, is unaware of the fact that he is in atrial fibrillation today. His recent burden of atrial fibrillation is around 42% and has been slowly increasing over the last 10 months or so.. Ventricular rate control is generally good, typically in the 80-90 bpm range while in atrial fibrillation. He has 57 % atrial pacing (virtually all the time that he has not In atrial fib) and only in 35 % ventricular pacing. His device has an expected generator longevity of another 5-6 years.  Echo performed in February 2018 showed moderately depressed left ventricular systolic function with ejection fraction of 40% with diffuse hypokinesis and mild to moderate LVH. There is mild to moderate aortic stenosis (mean gradient 12 mmHg, valve area 1.3 cm). The left atrium was moderate to severely dilated 47 mm.   Last saw Dr. Debara Pickett in February. He felt better after discontinuing amiodarone. In fact he has no cardiac complaints whatsoever. He has not had any bleeding complications while on treatment with Eliquis.   He had isolated mild swelling in his left upper extremity and showed some evidence of collateral vein circulation on the anterior chest and shoulder, consistent with subclavian vein occlusion, but this has resolved completely. He no longer has any swelling in the collateral veins have  shrunk..  Past Medical History:  Diagnosis Date  . Acute systolic congestive heart failure ([place]) 12/07/2015  . Arthritis   . Cellulitis   . Community acquired pneumonia 04/14/2014  . Deep vein thrombosis (DVT) of left upper extremity ([place]) 04/13/2016  . Depression 01/06/2016  . Dysrhythmia    PAF  . GERD (gastroesophageal reflux disease)   . History of nuclear stress test 10/05/2010   dipyridamole; normal pattern of perfusion in all regions; no significant ishcemia, low risk scan   . Hypertension   . OA (osteoarthritis)   . Pacemaker 07/22/2011   St. 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 Paroxysmal atrial fibrillation (HCC)   . Prostate cancer ([place])    radiation  . Shortness of breath     Past Surgical History:  Procedure Laterality Date  . CARDIOVERSION N/A 12/07/2015   Procedure: CARDIOVERSION;  Surgeon: Pixie Casino, MD;  Location: [facility] ENDOSCOPY;  Service: Cardiovascular;  Laterality: N/A;  . HERNIA REPAIR Bilateral   . INGUINAL HERNIA REPAIR Left 06/10/2015   Procedure: HERNIA REPAIR INGUINAL ADULT WITH MESH;  Surgeon: Aviva Signs, MD;  Location: [facility] ORS;  Service: General;  Laterality: Left;  Pt notified to arrive at 6:15am KF  . NM MYOCAR PERF WALL MOTION  10/05/10   normal  . PACEMAKER INSERTION  07/22/2011   St. 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 PERMANENT PACEMAKER INSERTION N/A 07/22/2011  Procedure: PERMANENT PACEMAKER INSERTION;  Surgeon: Sanda Klein, MD;  Location: [facility] CATH LAB;  Service: Cardiovascular;  Laterality: N/A;  . PILONIDAL CYST DRAINAGE     x2  . TEE WITHOUT CARDIOVERSION N/A 12/07/2015   Procedure: TRANSESOPHAGEAL ECHOCARDIOGRAM (TEE);  Surgeon: Pixie Casino, MD;  Location: [facility] ENDOSCOPY;  Service: Cardiovascular;  Laterality: N/A;  . TRANSTHORACIC ECHOCARDIOGRAM  10/05/2010   EF=50-55%, normal LV systolic function; LA & RA mildly dilated; mild MR; mild TR with  RV systolic pressure elevted at 30-73mmHg; AV mildly sclerotic with mild calcif of AV leaflets, mild valvular AS, mild-mod regurg; trace pulm valve regurg  . US ECHOCARDIOGRAPHY  10/05/10   LA mildly dilated, mild TR, mild ca+ of AOV ,mild to mod. AI    Outpatient Medications Prior to Visit  Medication Sig Dispense Refill  . ELIQUIS 5 MG TABS tablet TAKE ONE TABLET TWICE DAILY 60 tablet 1  . furosemide (LASIX) 40 MG tablet Take 1 tablet (40 mg total) by mouth daily. 30 tablet 6  . metoprolol succinate (TOPROL-XL) 50 MG 24 hr tablet Take 1 tablet (50 mg total) by mouth daily. 90 tablet 3  . tamsulosin (FLOMAX) 0.4 MG CAPS capsule Take 0.4 mg by mouth daily.    . vitamin B-12 (CYANOCOBALAMIN) 1000 MCG tablet Take 1,000 mcg by mouth daily.    Marland Kitchen VITAMIN C, CALCIUM ASCORBATE, PO Take 1,000 mg by mouth daily.     Marland Kitchen VITAMIN D, CHOLECALCIFEROL, PO Take 1,000 mg by mouth daily.      No facility-administered medications prior to visit.      Allergies:   Patient has no known allergies.   Social History   Social History  . Marital status: Married    Spouse name: N/A  . Number of children: 5  . Years of education: N/A   Occupational History  .  Retired   Social History Main Topics  . Smoking status: Current Every Day Smoker    Packs/day: 0.50    Years: 65.00  . Smokeless tobacco: Never Used     Comment: now 1/2 ppd (07/09/14)  . Alcohol use No  . Drug use: No  . Sexual activity: Not Currently   Other Topics Concern  . None   Social History Narrative  . None       ROS:   Please see the history of present illness.    ROS All other systems reviewed and are negative.   PHYSICAL EXAM:   VS:  BP (!) 141/82   Pulse 97   Ht 5\' 9"  (1.753 m)   Wt 160 lb (72.6 kg)   BMI 23.63 kg/m    GEN: Well nourished, well developed, in no acute distress  General: Alert, oriented x3, no distress Head: no evidence of trauma, PERRL, EOMI, no exophtalmos or lid lag, no myxedema, no xanthelasma;  normal ears, nose and oropharynx Neck: normal jugular venous pulsations and no hepatojugular reflux; brisk carotid pulses without delay and no carotid bruits Chest: clear to auscultation, no signs of consolidation by percussion or palpation, normal fremitus, symmetrical and full respiratory excursions Cardiovascular: normal position and quality of the apical impulse, regular rhythm, normal first and second heart sounds, no murmurs, rubs or gallops Abdomen: no tenderness or distention, no masses by palpation, no abnormal pulsatility or arterial bruits, normal bowel sounds, no hepatosplenomegaly Extremities: no clubbing, cyanosis or edema; 2+ radial, ulnar and brachial pulses bilaterally; 2+ right femoral, posterior tibial and dorsalis pedis pulses; 2+ left femoral, posterior tibial and  dorsalis pedis pulses; no subclavian or femoral bruits Neurological: grossly nonfocal Psych: euthymic mood, full affect  Wt Readings from Last 3 Encounters:  09/14/16 160 lb (72.6 kg)  04/13/16 161 lb 3.2 oz (73.1 kg)  03/15/16 158 lb 3.2 oz (71.8 kg)      Studies/Labs Reviewed:   EKG:  EKG is  ordered today. It shows atrial fibrillation with controlled ventricular rates and alternating ventricular sensed, ventricular paced beats, probable background LVH with secondary repolarization changes.  Recent Labs: 11/04/2015: ALT 15; Brain Natriuretic Peptide 392.5 12/01/2015: BUN 16; Creat 0.87; Hemoglobin 14.6; Platelets 213; Potassium 4.6; Sodium 137; TSH 5.33   Lipid Panel    Component Value Date/Time   CHOL 131 12/21/2012 0820   TRIG 68 12/21/2012 0820   HDL 53 12/21/2012 0820   CHOLHDL 2.5 12/21/2012 0820   VLDL 14 12/21/2012 0820   LDLCALC 64 12/21/2012 0820      ASSESSMENT:    1. Persistent atrial fibrillation ([place])   2. Tachycardia-bradycardia syndrome ([place])   3. Pacemaker - St Jude Accent DR RF June 2013, sinus node arrest   4. Chronic systolic CHF (congestive heart failure) ([place])   5.  Chronic deep vein thrombosis (DVT) of axillary vein of left upper extremity (HCC)   6. Chronic anticoagulation      PLAN:  In order of problems listed above:  1. AFIB:  He is tolerating the arrhythmia without any obvious symptoms. He definitely felt worse while he was taking antiarrhythmics. He has a lengthy episodes of persistent atrial fibrillation, sometimes lasting for a whole week and the overall burden of the arrhythmia has been steadily increasing over the last year. He is compliant with anticoagulation. HCDSVasc 5 (age 30, CHF, CAD, HTN). 2. SSS: pacemaker implanted 2013. No syncope since pacemaker implantation. He is not pacemaker dependent, but did have prolonged postconversion pauses. 3. LUE venous obstruction: This appears to have resolved based on clinical exam.. 4. remote pacemaker follow-up via the [employer] system every 3 months, will see him back in 12 months in the office. 5. CHF: Etiology of his cardiomyopathy is not clear, could have been tachycardia related .Need to adjust his beta blocker dose to maintain a balance between episodes of RVR and periods of excessive ventricular pacing, both of which could be deleterious for heart failure decompensation. At this point, I think we have struck the right balance. Clinically he appears euvolemic and has good functional status (NYHA class I-II). Most recent echo in [place] 2018 actually showed substantial improvement in LVEF. If EF deteriorates and he has very [place] ventricular pacing, there is an option to upgrade his device to CRT-P. 6. Eliquis, well-tolerated without bleeding problems   Medication Adjustments/Labs and Tests Ordered: Current medicines are reviewed at length with the patient today.  Concerns regarding medicines are outlined above.  Medication changes, Labs and Tests ordered today are listed in the Patient Instructions below. Patient Instructions  Dr Sallyanne Kuster recommends that you continue on your current medications  as directed. Please refer to the Current Medication list given to you today.  Remote monitoring is used to monitor your Pacemaker or ICD from home. This monitoring reduces the number of office visits required to check your device to one time per year. It allows [place] to keep an eye on the functioning of your device to ensure it is working properly. You are scheduled for a device check from home on Wednesday, October 31st, 2018. You may send your transmission at any time that day. If  you have a wireless device, the transmission will be sent automatically. After your physician reviews your transmission, you will receive a notification with your next transmission date.  Dr Sallyanne Kuster recommends that you schedule a follow-up appointment in 12 months with a pacemaker check. You will receive a reminder letter in the mail two months in advance. If you don't receive a letter, please call our office to schedule the follow-up appointment.  If you need a refill on your cardiac medications before your next appointment, please call your pharmacy.      Signed, Sanda Klein, MD  09/14/2016 9:42 AM    [facility] [place], [place],   [address] Phone: [phone]; Fax: [phone]

## 2016-09-14 NOTE — Patient Instructions (Signed)

## 2016-09-15 ENCOUNTER — Encounter: Payer: Self-pay | Admitting: Cardiology

## 2016-09-21 LAB — CUP PACEART INCLINIC DEVICE CHECK
Brady Statistic RA Percent Paced: 57 %
Brady Statistic RV Percent Paced: 35 %
Date Time Interrogation Session: 20180808132233
Implantable Lead Implant Date: 20130607
Implantable Lead Location: 753859
Implantable Pulse Generator Implant Date: 20130607
Lead Channel Impedance Value: 490 Ohm
Lead Channel Pacing Threshold Amplitude: 1 V
Lead Channel Sensing Intrinsic Amplitude: 0.5 mV
Lead Channel Setting Pacing Pulse Width: 0.5 ms
Lead Channel Setting Sensing Sensitivity: 2 mV
MDC IDC LEAD IMPLANT DT: 20130607
MDC IDC LEAD LOCATION: 753860
MDC IDC MSMT BATTERY REMAINING LONGEVITY: 66 mo
MDC IDC MSMT BATTERY REMAINING PERCENTAGE: 73 %
MDC IDC MSMT BATTERY VOLTAGE: 2.92 V
MDC IDC MSMT LEADCHNL RA IMPEDANCE VALUE: 430 Ohm
MDC IDC MSMT LEADCHNL RV PACING THRESHOLD PULSEWIDTH: 0.5 ms
MDC IDC MSMT LEADCHNL RV SENSING INTR AMPL: 12 mV
MDC IDC SET LEADCHNL RA PACING AMPLITUDE: 2.5 V
MDC IDC SET LEADCHNL RV PACING AMPLITUDE: 2.5 V
Pulse Gen Model: 2210
Pulse Gen Serial Number: 7353349

## 2016-09-26 DIAGNOSIS — R5383 Other fatigue: Secondary | ICD-10-CM | POA: Diagnosis not present

## 2016-09-26 DIAGNOSIS — D519 Vitamin B12 deficiency anemia, unspecified: Secondary | ICD-10-CM | POA: Diagnosis not present

## 2016-09-26 DIAGNOSIS — E559 Vitamin D deficiency, unspecified: Secondary | ICD-10-CM | POA: Diagnosis not present

## 2016-09-28 DIAGNOSIS — E559 Vitamin D deficiency, unspecified: Secondary | ICD-10-CM | POA: Diagnosis not present

## 2016-09-28 DIAGNOSIS — E875 Hyperkalemia: Secondary | ICD-10-CM | POA: Diagnosis not present

## 2016-09-28 DIAGNOSIS — D509 Iron deficiency anemia, unspecified: Secondary | ICD-10-CM | POA: Diagnosis not present

## 2016-09-28 DIAGNOSIS — R5383 Other fatigue: Secondary | ICD-10-CM | POA: Diagnosis not present

## 2016-10-01 ENCOUNTER — Other Ambulatory Visit: Payer: Self-pay | Admitting: Internal Medicine

## 2016-10-11 ENCOUNTER — Ambulatory Visit (INDEPENDENT_AMBULATORY_CARE_PROVIDER_SITE_OTHER): Payer: Medicare Other | Admitting: Internal Medicine

## 2016-10-11 ENCOUNTER — Encounter: Payer: Self-pay | Admitting: Internal Medicine

## 2016-10-11 VITALS — BP 118/73 | HR 96 | Ht 69.0 in | Wt 166.2 lb

## 2016-10-11 DIAGNOSIS — I481 Persistent atrial fibrillation: Secondary | ICD-10-CM

## 2016-10-11 DIAGNOSIS — I4819 Other persistent atrial fibrillation: Secondary | ICD-10-CM

## 2016-10-11 DIAGNOSIS — I872 Venous insufficiency (chronic) (peripheral): Secondary | ICD-10-CM | POA: Diagnosis not present

## 2016-10-11 DIAGNOSIS — Z950 Presence of cardiac pacemaker: Secondary | ICD-10-CM

## 2016-10-11 DIAGNOSIS — Z7901 Long term (current) use of anticoagulants: Secondary | ICD-10-CM | POA: Diagnosis not present

## 2016-10-11 DIAGNOSIS — I5022 Chronic systolic (congestive) heart failure: Secondary | ICD-10-CM

## 2016-10-11 NOTE — Patient Instructions (Signed)
Your physician wants you to follow-up in: 6 months with Dr. Hilty. You will receive a reminder letter in the mail two months in advance. If you don't receive a letter, please call our office to schedule the follow-up appointment.    

## 2016-10-11 NOTE — Progress Notes (Signed)
OFFICE NOTE  Chief Complaint:  Back pain  Primary Care Physician: Celene Squibb, MD  HPI:  Cody George is an 83 year old gentleman who has a history of atrial fibrillation and tachy-brady syndrome as well as paroxysmal atrial fibrillation and sinus node arrest. He underwent a St. Jude Accent DR dual-chamber pacemaker in June of 2013. He has done fairly well other than he had some hypotension on lisinopril which was discontinued. He has recently had some tachyarrhythmias and had increased his metoprolol succinate to 25 mg daily due to tachyarrhythmias. He is enrolled in a home monitoring service with the [facility] patient care network. He was successfully transitioned over to Eliquis she is taking without any bleeding difficulty. He denies any shortness of breath or worsening chest pain. He is due for a remote pacer check in a few days.  Cody George returns for follow-up today. He is feeling well denies any chest pain or worsening shortness of breath. He has no palpitations. Blood pressure is well controlled. He had recent bilateral cataract surgery and is seeing much better. There are no bleeding issues.  I saw Cody George back in the office today for follow-up. Tells me a few months ago he had pneumonia and has been very slow to recover. Chest x-ray shows improvement however there are changes consistent with COPD. He has a greater than 65 year history of smoking. I do not believe he carries a diagnosis of COPD and is currently not on any medications for this. He reports some worsening shortness of breath as well has fatigue. He denies any particular chest pain. He does have valvular heart disease and history of aortic stenosis. Recent echo in March shows some worsening of valvular function with now moderate aortic stenosis.  Cody George returns today for follow-up. Overall he is without complaints. He scheduled to see Dr. Loletha Grayer in the office next week for follow-up of his pacemaker. He is  atrially paced today. He had a low burden of atrial fibrillation but remains on Eliquis. He is asymptomatic with this. He's had no bleeding problems on Eliquis. Blood pressure is high normal today 152/82. He says that generally is between 858 and 850 systolic at home. I reminded him that his prior echocardiogram showed what may be moderate aortic stenosis. Interestingly, I have not been able to auscultate that on exam. His prior echo was almost a year ago and will be due for repeat echo this year.  11/04/2015  Cody George was seen today in the office. He reports that over the past several months she's had a significant decline. He has had worsening energy and fatigue as well as shortness of breath. He can only walk short distances before becoming significantly short of breath. He is also developed a mildly productive cough with a tannish sputum. He is concerned about whether he might have had recurrent pneumonia. He does have a history of COPD and pneumonia in the past. An echocardiogram was performed and March 2017 which showed mild to moderate aortic stenosis by valve gradient however visually the aortic valve appeared to be moderate to severely stenosed. On exam is very difficult with his COPD and hyperexpansion to auscultate his aortic murmur and I cannot reliably hear the second heart sound. Cody George appeared to be short of breath today and was unable to finish sentences at times. He denies any fevers or chills at home. He has had some mild recent weight loss. He also complains of lower extremity swelling, which  is worse on the left leg than the right. There is some orthopnea.  11/16/2015  Cody George returns today for follow-up. He reports feeling much better. His weight has come down several pounds and he notes marked improvement in his edema. His shortness of breath is also improved and he feels much more energized. Unfortunately his echocardiogram shows a newly reduced LVEF of 20-25% with global  hypokinesis. 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 His chest x-ray showed no evidence of pneumonia.  01/06/2016  Cody George was seen today in follow-up. He underwent cardioversion after loading with amiodarone which required 3 shocks but ultimately he converted back to sinus rhythm. Today he is in an atrial paced rhythm but appears to be maintaining sinus. Unfortunately he feels "horrible". It's not clear why he feels worse today but has felt better in the past. He says some days are good and some days are bad. He also became very tearful in the office today. He is still quite upset about the death of a family member last year. I'm concerned that he is struggling from depression. He does not appear volume overloaded on exam. Now that he is back in sinus rhythm, his cardiac output should be better but he still reports fatigue, poor sleep at night, lack of interest in things, tearfulness and other symptoms which may be more likely to be depression.  04/13/2016  Cody George returns today for follow-up. He is doing markedly better. His color is improved significantly and so is his attitude. He has generally maintained a sinus rhythm since cardioversion on 12/07/2015. He was on amiodarone but had significant side effects with this. The medication was then wean down and discontinued. His overall burden of atrial fibrillation was about 27%. In general it seems like his A. fib may be playing a role in his fatigue. He recently has been describing some left upper extremity swelling. He saw Dr. Loletha Grayer, who felt that this could be occlusion of the left subclavian vein related to the pacemaker leads with possibly super impose thrombus. Today he reports is been no significant change in his swelling despite being on Eliquis for more than 3 months. He has been trying to keep his arm  elevated.  10/11/2016  Cody George returns today for follow-up. He recently saw Dr. Loletha Grayer for interrogation of his pacemaker. He had an echo in February which showed improvement in EF from 20-25% up to 40%. He says he feels great and in fact if he didn't have ongoing back pain he be in very good shape. He's had resolution of his upper extremity swelling. He does get some right lower extremity swelling. He has hemosiderin deposition of the right lower extremity and some superficial varicosities suggesting poor venous return. He notes marked improvement when elevating his foot on aggressive suspect this is venous insufficiency. He may benefit from wearing compression stocking. He denies any further syncope. He has no chest pain or worsening shortness of breath.  PMHx:  Past Medical History:  Diagnosis Date  . Acute systolic congestive heart failure ([place]) 12/07/2015  . Arthritis   . Cellulitis   . Community acquired pneumonia 04/14/2014  . Deep vein thrombosis (DVT) of left upper extremity ([place]) 04/13/2016  . Depression 01/06/2016  . Dysrhythmia    PAF  . GERD (gastroesophageal reflux disease)   . History of nuclear stress test 10/05/2010   dipyridamole; normal pattern of perfusion in  all regions; no significant ishcemia, low risk scan   . Hypertension   . OA (osteoarthritis)   . Pacemaker 07/22/2011   St. 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 Paroxysmal atrial fibrillation (HCC)   . Prostate cancer ([place])    radiation  . Shortness of breath     Past Surgical History:  Procedure Laterality Date  . CARDIOVERSION N/A 12/07/2015   Procedure: CARDIOVERSION;  Surgeon: Pixie Casino, MD;  Location: [facility] ENDOSCOPY;  Service: Cardiovascular;  Laterality: N/A;  . HERNIA REPAIR Bilateral   . INGUINAL HERNIA REPAIR Left 06/10/2015   Procedure: HERNIA REPAIR INGUINAL ADULT WITH MESH;  Surgeon: Aviva Signs, MD;  Location: [facility] ORS;  Service: General;  Laterality:  Left;  Pt notified to arrive at 6:15am KF  . NM MYOCAR PERF WALL MOTION  10/05/10   normal  . PACEMAKER INSERTION  07/22/2011   St. 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 PERMANENT PACEMAKER INSERTION N/A 07/22/2011   Procedure: PERMANENT PACEMAKER INSERTION;  Surgeon: Sanda Klein, MD;  Location: [facility] CATH LAB;  Service: Cardiovascular;  Laterality: N/A;  . PILONIDAL CYST DRAINAGE     x2  . TEE WITHOUT CARDIOVERSION N/A 12/07/2015   Procedure: TRANSESOPHAGEAL ECHOCARDIOGRAM (TEE);  Surgeon: Pixie Casino, MD;  Location: [facility] ENDOSCOPY;  Service: Cardiovascular;  Laterality: N/A;  . TRANSTHORACIC ECHOCARDIOGRAM  10/05/2010   EF=50-55%, normal LV systolic function; LA & RA mildly dilated; mild MR; mild TR with RV systolic pressure elevted at 30-28mmHg; AV mildly sclerotic with mild calcif of AV leaflets, mild valvular AS, mild-mod regurg; trace pulm valve regurg  . US ECHOCARDIOGRAPHY  10/05/10   LA mildly dilated, mild TR, mild ca+ of AOV ,mild to mod. AI    FAMHx:  No family history on file.  SOCHx:   reports that he has been smoking.  He has a 32.50 pack-year smoking history. He has never used smokeless tobacco. He reports that he does not drink alcohol or use drugs.  ALLERGIES:  No Known Allergies  ROS: Pertinent items noted in HPI and remainder of comprehensive ROS otherwise negative.  HOME MEDS: Current Outpatient Prescriptions  Medication Sig Dispense Refill  . ELIQUIS 5 MG TABS tablet TAKE ONE TABLET TWICE DAILY 180 tablet 1  . furosemide (LASIX) 40 MG tablet Take 40 mg by mouth daily.    . metoprolol succinate (TOPROL-XL) 50 MG 24 hr tablet Take 1 tablet (50 mg total) by mouth daily. 90 tablet 3  . tamsulosin (FLOMAX) 0.4 MG CAPS capsule Take 0.4 mg by mouth daily.    . vitamin B-12 (CYANOCOBALAMIN) 1000 MCG tablet Take 1,000 mcg by mouth daily.    Marland Kitchen VITAMIN C, CALCIUM ASCORBATE, PO Take 1,000 mg by mouth daily.     Marland Kitchen VITAMIN D,  CHOLECALCIFEROL, PO Take 1,000 mg by mouth daily.      No current facility-administered medications for this visit.     LABS/IMAGING: No results found for this or any previous visit (from the past 48 hour(s)). No results found.  VITALS: BP 118/73   Pulse 96   Ht 5\' 9"  (1.753 m)   Wt 166 lb 3.2 oz (75.4 kg)   BMI 24.54 kg/m   EXAM: General appearance: alert and no distress Neck: no carotid bruit, no JVD and thyroid not enlarged, symmetric, no tenderness/mass/nodules Lungs: clear to auscultation bilaterally Heart: irregularly irregular rhythm, S1, S2 normal, systolic murmur: systolic ejection 2/6,  crescendo at 2nd right intercostal space and possibly a diastolic mumur Abdomen: soft, non-tender; bowel sounds normal; no masses,  no organomegaly Extremities: edema Trace right lower extremity edema and venous stasis dermatitis noted Pulses: 2+ and symmetric Skin: Skin color, texture, turgor normal. No rashes or lesions Neurologic: Grossly normal Psych: Pleasant  EKG: Deferred  ASSESSMENT: 1. Chronic systolic congestive heart failure, LVEF 20-25% (improved to 40% - 2018) 2. Persistent atrial fibrillation - s/p DCCV (intolerant to amiodarone) 3. Status post St. Jude Accent DR pacemaker for sinus arrest 4. Labile hypertension - controlled 5. Chronic anticoagulation on Eliquis 6. COPD 7. Aortic stenosis - mild to moderate (03/2016)  PLAN: 1.   Mr. Ancrum says he feels well and has no signs or symptoms of worsening heart failure. He is in persistent A. fib and appropriate anticoagulated on Eliquis. He has chronic systolic congestive heart failure however EF improved to 40% this year. Blood pressures been well controlled. He has COPD which is quiet sent. He has aortic stenosis which is mild to moderate based on echo this year. We'll continue to follow that clinically. Follow-up with me in 6 months sooner as necessary.  Pixie Casino, MD, [facility] Attending Cardiologist [place] C Hilty 10/11/2016, 9:13 AM

## 2016-10-18 ENCOUNTER — Ambulatory Visit (INDEPENDENT_AMBULATORY_CARE_PROVIDER_SITE_OTHER): Payer: Medicare Other | Admitting: Orthopaedic Surgery

## 2016-10-18 ENCOUNTER — Ambulatory Visit (INDEPENDENT_AMBULATORY_CARE_PROVIDER_SITE_OTHER): Payer: Medicare Other

## 2016-10-18 ENCOUNTER — Encounter: Payer: Self-pay | Admitting: Orthopaedic Surgery

## 2016-10-18 VITALS — BP 141/84 | HR 90 | Temp 97.3°F | Ht 68.0 in | Wt 165.0 lb

## 2016-10-18 DIAGNOSIS — M5441 Lumbago with sciatica, right side: Secondary | ICD-10-CM

## 2016-10-18 DIAGNOSIS — G8929 Other chronic pain: Secondary | ICD-10-CM

## 2016-10-18 DIAGNOSIS — M25551 Pain in right hip: Secondary | ICD-10-CM | POA: Diagnosis not present

## 2016-10-18 DIAGNOSIS — F17210 Nicotine dependence, cigarettes, uncomplicated: Secondary | ICD-10-CM | POA: Diagnosis not present

## 2016-10-18 MED ORDER — HYDROCODONE-ACETAMINOPHEN 5-325 MG PO TABS: ORAL_TABLET | ORAL | 0 refills | Status: DC

## 2016-10-18 MED ORDER — PREDNISONE 5 MG (21) PO TBPK: ORAL_TABLET | ORAL | 0 refills | Status: DC

## 2016-10-18 NOTE — Progress Notes (Signed)
Subjective:    Patient ID: Cody George, male    DOB: 1930-07-01, 83 y.o.   MRN: [id number]  HPI He has had increasing pain in the lower back on the right side over the last six weeks or so.  He has no trauma.  He has pain to the right hip and below.  He has no weakness.  He cannot take NSAIDs as he is on blood thinner.  He has pacemaker as well and cannot have MRI.  He has tried Tylenol, heat, ice with no help.  He is not sleeping well.  He has pain with prolonged sitting.  He is not getting better.   Review of Systems  HENT: Negative for congestion.   Respiratory: Positive for shortness of breath. Negative for cough.   Cardiovascular: Negative for chest pain and leg swelling.  Endocrine: Positive for cold intolerance.  Musculoskeletal: Positive for arthralgias, back pain and gait problem.  Allergic/Immunologic: Positive for environmental allergies.   Past Medical History:  Diagnosis Date  . Acute systolic congestive heart failure ([place]) 12/07/2015  . Arthritis   . Cellulitis   . Community acquired pneumonia 04/14/2014  . Deep vein thrombosis (DVT) of left upper extremity ([place]) 04/13/2016  . Depression 01/06/2016  . Dysrhythmia    PAF  . GERD (gastroesophageal reflux disease)   . History of nuclear stress test 10/05/2010   dipyridamole; normal pattern of perfusion in all regions; no significant ishcemia, low risk scan   . Hypertension   . OA (osteoarthritis)   . Pacemaker 07/22/2011   St. 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 Paroxysmal atrial fibrillation (HCC)   . Prostate cancer ([place])    radiation  . Shortness of breath     Past Surgical History:  Procedure Laterality Date  . CARDIOVERSION N/A 12/07/2015   Procedure: CARDIOVERSION;  Surgeon: Pixie Casino, MD;  Location: [facility] ENDOSCOPY;  Service: Cardiovascular;  Laterality: N/A;  . HERNIA REPAIR Bilateral   . INGUINAL HERNIA REPAIR Left 06/10/2015   Procedure: HERNIA REPAIR  INGUINAL ADULT WITH MESH;  Surgeon: Aviva Signs, MD;  Location: [facility] ORS;  Service: General;  Laterality: Left;  Pt notified to arrive at 6:15am KF  . NM MYOCAR PERF WALL MOTION  10/05/10   normal  . PACEMAKER INSERTION  07/22/2011   St. 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 PERMANENT PACEMAKER INSERTION N/A 07/22/2011   Procedure: PERMANENT PACEMAKER INSERTION;  Surgeon: Sanda Klein, MD;  Location: [facility] CATH LAB;  Service: Cardiovascular;  Laterality: N/A;  . PILONIDAL CYST DRAINAGE     x2  . TEE WITHOUT CARDIOVERSION N/A 12/07/2015   Procedure: TRANSESOPHAGEAL ECHOCARDIOGRAM (TEE);  Surgeon: Pixie Casino, MD;  Location: [facility] ENDOSCOPY;  Service: Cardiovascular;  Laterality: N/A;  . TRANSTHORACIC ECHOCARDIOGRAM  10/05/2010   EF=50-55%, normal LV systolic function; LA & RA mildly dilated; mild MR; mild TR with RV systolic pressure elevted at 30-41mmHg; AV mildly sclerotic with mild calcif of AV leaflets, mild valvular AS, mild-mod regurg; trace pulm valve regurg  . US ECHOCARDIOGRAPHY  10/05/10   LA mildly dilated, mild TR, mild ca+ of AOV ,mild to mod. AI    Current Outpatient Prescriptions on File Prior to Visit  Medication Sig Dispense Refill  . ELIQUIS 5 MG TABS tablet TAKE ONE TABLET TWICE DAILY 180 tablet 1  . furosemide (LASIX) 40 MG tablet Take 40 mg by mouth daily.    Marland Kitchen  metoprolol succinate (TOPROL-XL) 50 MG 24 hr tablet Take 1 tablet (50 mg total) by mouth daily. 90 tablet 3  . tamsulosin (FLOMAX) 0.4 MG CAPS capsule Take 0.4 mg by mouth daily.    . vitamin B-12 (CYANOCOBALAMIN) 1000 MCG tablet Take 1,000 mcg by mouth daily.    Marland Kitchen VITAMIN C, CALCIUM ASCORBATE, PO Take 1,000 mg by mouth daily.     Marland Kitchen VITAMIN D, CHOLECALCIFEROL, PO Take 1,000 mg by mouth daily.      No current facility-administered medications on file prior to visit.     Social History   Social History  . Marital status: Married    Spouse name: N/A  . Number of children:  5  . Years of education: N/A   Occupational History  .  Retired   Social History Main Topics  . Smoking status: Current Every Day Smoker    Packs/day: 0.50    Years: 65.00  . Smokeless tobacco: Never Used     Comment: now 1/2 ppd (07/09/14)  . Alcohol use No  . Drug use: No  . Sexual activity: Not Currently   Other Topics Concern  . Not on file   Social History Narrative  . No narrative on file    History reviewed. No pertinent family history.  BP (!) 141/84   Pulse 90   Temp (!) 97.3 F (36.3 C)   Ht 5\' 8"  (5.361 m)   Wt 165 lb (74.8 kg)   BMI 25.09 kg/m      Objective:   Physical Exam  Constitutional: He is oriented to person, place, and time. He appears well-developed and well-nourished.  HENT:  Head: Normocephalic and atraumatic.  Eyes: Pupils are equal, round, and reactive to light. Conjunctivae and EOM are normal.  Neck: Normal range of motion. Neck supple.  Cardiovascular: Normal rate, regular rhythm and intact distal pulses.   Pulmonary/Chest: Effort normal.  Abdominal: Soft.  Musculoskeletal: Tenderness: His right lower back is tender, no spasm, ROM is full but painful, SLR negative, NV intact.  Hip ROM is full.    Neurological: He is alert and oriented to person, place, and time. He has normal reflexes. He displays normal reflexes. No cranial nerve deficit. He exhibits normal muscle tone. Coordination normal.  Skin: Skin is warm and dry.  Psychiatric: He has a normal mood and affect. His behavior is normal. Judgment and thought content normal.  Vitals reviewed.   X-rays of the lumbar spine and pelvis were done, reported separately.      Assessment & Plan:   Encounter Diagnoses  Name Primary?  . Chronic right-sided low back pain with right-sided sciatica Yes  . Pain of right hip joint   . Cigarette nicotine dependence without complication    I would like to get CT of the lumbar spine.  He also has enlargement of the distal aorta by plain  X-rays.  He smokes and says it will be hard to cut back.  Call if any problem.  Precautions discussed.   Electronically Signed Sanjuana Kava, MD 9/4/20182:18 PM

## 2016-10-18 NOTE — Patient Instructions (Signed)
Steps to Quit Smoking Smoking tobacco can be bad for your health. It can also affect almost every organ in your body. Smoking puts you and people around you at risk for many serious Koleman Marling-lasting (chronic) diseases. Quitting smoking is hard, but it is one of the best things that you can do for your health. It is never too late to quit. What are the benefits of quitting smoking? When you quit smoking, you lower your risk for getting serious diseases and conditions. They can include:  Lung cancer or lung disease.  Heart disease.  Stroke.  Heart attack.  Not being able to have children (infertility).  Weak bones (osteoporosis) and broken bones (fractures).  If you have coughing, wheezing, and shortness of breath, those symptoms may get better when you quit. You may also get sick less often. If you are pregnant, quitting smoking can help to lower your chances of having a baby of low birth weight. What can I do to help me quit smoking? Talk with your doctor about what can help you quit smoking. Some things you can do (strategies) include:  Quitting smoking totally, instead of slowly cutting back how much you smoke over a period of time.  Going to in-person counseling. You are more likely to quit if you go to many counseling sessions.  Using resources and support systems, such as: ? Online chats with a counselor. ? Phone quitlines. ? Printed self-help materials. ? Support groups or group counseling. ? Text messaging programs. ? Mobile phone apps or applications.  Taking medicines. Some of these medicines may have nicotine in them. If you are pregnant or breastfeeding, do not take any medicines to quit smoking unless your doctor says it is okay. Talk with your doctor about counseling or other things that can help you.  Talk with your doctor about using more than one strategy at the same time, such as taking medicines while you are also going to in-person counseling. This can help make  quitting easier. What things can I do to make it easier to quit? Quitting smoking might feel very hard at first, but there is a lot that you can do to make it easier. Take these steps:  Talk to your family and friends. Ask them to support and encourage you.  Call phone quitlines, reach out to support groups, or work with a counselor.  Ask people who smoke to not smoke around you.  Avoid places that make you want (trigger) to smoke, such as: ? Bars. ? Parties. ? Smoke-break areas at work.  Spend time with people who do not smoke.  Lower the stress in your life. Stress can make you want to smoke. Try these things to help your stress: ? Getting regular exercise. ? Deep-breathing exercises. ? Yoga. ? Meditating. ? Doing a body scan. To do this, close your eyes, focus on one area of your body at a time from head to toe, and notice which parts of your body are tense. Try to relax the muscles in those areas.  Download or buy apps on your mobile phone or tablet that can help you stick to your quit plan. There are many free apps, such as QuitGuide from the CDC (Centers for Disease Control and Prevention). You can find more support from smokefree.gov and other websites.  This information is not intended to replace advice given to you by your health care provider. Make sure you discuss any questions you have with your health care provider. Document Released: 11/27/2008 Document   Revised: 09/29/2015 Document Reviewed: 06/17/2014 Elsevier Interactive Patient Education  2018 Elsevier Inc.  

## 2016-10-19 LAB — CUP PACEART REMOTE DEVICE CHECK
Battery Remaining Longevity: 69 mo
Battery Voltage: 2.92 V
Brady Statistic AP VP Percent: 1 %
Brady Statistic AP VS Percent: 98 %
Brady Statistic AS VS Percent: 1 %
Brady Statistic RA Percent Paced: 57 %
Brady Statistic RV Percent Paced: 35 %
Implantable Lead Implant Date: 20130607
Implantable Lead Location: 753860
Implantable Pulse Generator Implant Date: 20130607
Lead Channel Impedance Value: 460 Ohm
Lead Channel Pacing Threshold Amplitude: 0.875 V
Lead Channel Pacing Threshold Pulse Width: 0.4 ms
Lead Channel Setting Pacing Amplitude: 2.5 V
Lead Channel Setting Sensing Sensitivity: 2 mV
MDC IDC LEAD IMPLANT DT: 20130607
MDC IDC LEAD LOCATION: 753859
MDC IDC MSMT BATTERY REMAINING PERCENTAGE: 73 %
MDC IDC MSMT LEADCHNL RA IMPEDANCE VALUE: 430 Ohm
MDC IDC MSMT LEADCHNL RA PACING THRESHOLD PULSEWIDTH: 0.4 ms
MDC IDC MSMT LEADCHNL RA SENSING INTR AMPL: 2.7 mV
MDC IDC MSMT LEADCHNL RV PACING THRESHOLD AMPLITUDE: 1.25 V
MDC IDC MSMT LEADCHNL RV SENSING INTR AMPL: 12 mV
MDC IDC PG SERIAL: 7353349
MDC IDC SESS DTM: 20180731083021
MDC IDC SET LEADCHNL RA PACING AMPLITUDE: 2.5 V
MDC IDC SET LEADCHNL RV PACING PULSEWIDTH: 0.5 ms
MDC IDC STAT BRADY AS VP PERCENT: 1 %

## 2016-10-28 ENCOUNTER — Ambulatory Visit (HOSPITAL_COMMUNITY)
Admission: RE | Admit: 2016-10-28 | Discharge: 2016-10-28 | Disposition: A | Discharge status: Home / Self Care | Payer: Medicare Other | Source: Ambulatory Visit | Attending: Orthopaedic Surgery | Admitting: Orthopaedic Surgery

## 2016-10-28 DIAGNOSIS — M419 Scoliosis, unspecified: Secondary | ICD-10-CM | POA: Diagnosis not present

## 2016-10-28 DIAGNOSIS — M5136 Other intervertebral disc degeneration, lumbar region: Secondary | ICD-10-CM | POA: Insufficient documentation

## 2016-10-28 DIAGNOSIS — M5441 Lumbago with sciatica, right side: Secondary | ICD-10-CM | POA: Diagnosis not present

## 2016-10-28 DIAGNOSIS — I714 Abdominal aortic aneurysm, without rupture: Secondary | ICD-10-CM | POA: Insufficient documentation

## 2016-10-28 DIAGNOSIS — I723 Aneurysm of iliac artery: Secondary | ICD-10-CM | POA: Diagnosis not present

## 2016-10-28 DIAGNOSIS — M47816 Spondylosis without myelopathy or radiculopathy, lumbar region: Secondary | ICD-10-CM | POA: Diagnosis not present

## 2016-10-28 DIAGNOSIS — G8929 Other chronic pain: Secondary | ICD-10-CM | POA: Diagnosis not present

## 2016-10-28 DIAGNOSIS — M48061 Spinal stenosis, lumbar region without neurogenic claudication: Secondary | ICD-10-CM | POA: Diagnosis not present

## 2016-11-01 ENCOUNTER — Ambulatory Visit (INDEPENDENT_AMBULATORY_CARE_PROVIDER_SITE_OTHER): Payer: Medicare Other | Admitting: Orthopaedic Surgery

## 2016-11-01 VITALS — BP 124/76 | HR 125 | Temp 96.9°F | Ht 68.0 in | Wt 164.0 lb

## 2016-11-01 DIAGNOSIS — G8929 Other chronic pain: Secondary | ICD-10-CM

## 2016-11-01 DIAGNOSIS — I714 Abdominal aortic aneurysm, without rupture, unspecified: Secondary | ICD-10-CM

## 2016-11-01 DIAGNOSIS — M5441 Lumbago with sciatica, right side: Secondary | ICD-10-CM

## 2016-11-01 NOTE — Progress Notes (Signed)
[name], male DOB:10/24/1930, 83 y.o. XTK:[id number]  Chief Complaint  Patient presents with  . Results    CT Lumbar    HPI  Cody George is a 83 y.o. male who has lower back pain with right sided sciatica.  He had a CT of his lower back.  He has pacemaker and cannot have MRI.  He still has pain.  CT scan showed: IMPRESSION: 1. No acute finding. 2. Advanced lumbar disc degeneration at L1-2 to L4-5 with scoliosis. 3. L3-4 moderate spinal stenosis. Either L4 nerve root could be affected in the subarticular recesses. 4. L4-5 right foraminal L4 impingement due to disc degeneration. Mild bilateral subarticular recess narrowing. 5. Fusiform aneurysmal enlargement of the abdominal aorta, at least 4.7 cm. Both common iliac arteries are aneurysmal, up to 3 cm on the left. If appropriate for comorbidities, recommend vascular surgery Referral.  I have explained the findings to him.  I will have him see neurosurgeon as well as vascular surgeon. HPI  Body mass index is 24.94 kg/m.  ROS  Review of Systems  HENT: Negative for congestion.   Respiratory: Positive for shortness of breath. Negative for cough.   Cardiovascular: Negative for chest pain and leg swelling.  Endocrine: Positive for cold intolerance.  Musculoskeletal: Positive for arthralgias, back pain and gait problem.  Allergic/Immunologic: Positive for environmental allergies.    Past Medical History:  Diagnosis Date  . Acute systolic congestive heart failure ([place]) 12/07/2015  . Arthritis   . Cellulitis   . Community acquired pneumonia 04/14/2014  . Deep vein thrombosis (DVT) of left upper extremity ([place]) 04/13/2016  . Depression 01/06/2016  . Dysrhythmia    PAF  . GERD (gastroesophageal reflux disease)   . History of nuclear stress test 10/05/2010   dipyridamole; normal pattern of perfusion in all regions; no significant ishcemia, low risk scan   . Hypertension   . OA (osteoarthritis)   .  Pacemaker 07/22/2011   St. 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 Paroxysmal atrial fibrillation (HCC)   . Prostate cancer ([place])    radiation  . Shortness of breath     Past Surgical History:  Procedure Laterality Date  . CARDIOVERSION N/A 12/07/2015   Procedure: CARDIOVERSION;  Surgeon: Pixie Casino, MD;  Location: [facility] ENDOSCOPY;  Service: Cardiovascular;  Laterality: N/A;  . HERNIA REPAIR Bilateral   . INGUINAL HERNIA REPAIR Left 06/10/2015   Procedure: HERNIA REPAIR INGUINAL ADULT WITH MESH;  Surgeon: Aviva Signs, MD;  Location: [facility] ORS;  Service: General;  Laterality: Left;  Pt notified to arrive at 6:15am KF  . NM MYOCAR PERF WALL MOTION  10/05/10   normal  . PACEMAKER INSERTION  07/22/2011   St. 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 PERMANENT PACEMAKER INSERTION N/A 07/22/2011   Procedure: PERMANENT PACEMAKER INSERTION;  Surgeon: Sanda Klein, MD;  Location: [place] CATH LAB;  Service: Cardiovascular;  Laterality: N/A;  . PILONIDAL CYST DRAINAGE     x2  . TEE WITHOUT CARDIOVERSION N/A 12/07/2015   Procedure: TRANSESOPHAGEAL ECHOCARDIOGRAM (TEE);  Surgeon: Pixie Casino, MD;  Location: [facility] ENDOSCOPY;  Service: Cardiovascular;  Laterality: N/A;  . TRANSTHORACIC ECHOCARDIOGRAM  10/05/2010   EF=50-55%, normal LV systolic function; LA & RA mildly dilated; mild MR; mild TR with RV systolic pressure elevted at 30-52mmHg; AV mildly sclerotic with mild calcif of AV leaflets, mild valvular AS, mild-mod regurg; trace pulm valve regurg  .  US ECHOCARDIOGRAPHY  10/05/10   LA mildly dilated, mild TR, mild ca+ of AOV ,mild to mod. AI    No family history on file.  Social History Social History  Substance Use Topics  . Smoking status: Current Every Day Smoker    Packs/day: 0.50    Years: 65.00  . Smokeless tobacco: Never Used     Comment: now 1/2 ppd (07/09/14)  . Alcohol use No    No Known  Allergies  Current Outpatient Prescriptions  Medication Sig Dispense Refill  . ELIQUIS 5 MG TABS tablet TAKE ONE TABLET TWICE DAILY 180 tablet 1  . furosemide (LASIX) 40 MG tablet Take 40 mg by mouth daily.    Marland Kitchen HYDROcodone-acetaminophen (NORCO/VICODIN) 5-325 MG tablet One tablet every four hours as needed for acute pain.  Limit of five days per [place]. 30 tablet 0  . metoprolol succinate (TOPROL-XL) 50 MG 24 hr tablet Take 1 tablet (50 mg total) by mouth daily. 90 tablet 3  . predniSONE (STERAPRED UNI-PAK 21 TAB) 5 MG (21) TBPK tablet Take 6 pills first day; 5 pills second day; 4 pills third day; 3 pills fourth day; 2 pills next day and 1 pill last day. 21 tablet 0  . tamsulosin (FLOMAX) 0.4 MG CAPS capsule Take 0.4 mg by mouth daily.    . vitamin B-12 (CYANOCOBALAMIN) 1000 MCG tablet Take 1,000 mcg by mouth daily.    Marland Kitchen VITAMIN C, CALCIUM ASCORBATE, PO Take 1,000 mg by mouth daily.     Marland Kitchen VITAMIN D, CHOLECALCIFEROL, PO Take 1,000 mg by mouth daily.      No current facility-administered medications for this visit.      Physical Exam  Blood pressure 124/76, pulse (!) 125, temperature (!) 96.9 F (36.1 C), height 5\' 8"  (1.727 m), weight 164 lb (74.4 kg).  Constitutional: overall normal hygiene, normal nutrition, well developed, normal grooming, normal body habitus. Assistive device:none  Musculoskeletal: gait and station Limp none, muscle tone and strength are normal, no tremors or atrophy is present.  .  Neurological: coordination overall normal.  Deep tendon reflex/nerve stretch intact.  Sensation normal.  Cranial nerves II-XII intact.   Skin:   Normal overall no scars, lesions, ulcers or rashes. No psoriasis.  Psychiatric: Alert and oriented x 3.  Recent memory intact, remote memory unclear.  Normal mood and affect. Well groomed.  Good eye contact.  Cardiovascular: overall no swelling, no varicosities, no edema bilaterally, normal temperatures of the legs and arms, no  clubbing, cyanosis and good capillary refill.  Lymphatic: palpation is normal.  All other systems reviewed and are negative   Spine/Pelvis examination:  Inspection:  Overall, sacoiliac joint benign and hips nontender; without crepitus or defects.   Thoracic spine inspection: Alignment normal without kyphosis present   Lumbar spine inspection:  Alignment  with normal lumbar lordosis, with scoliosis apparent.   Thoracic spine palpation:  without tenderness of spinal processes   Lumbar spine palpation: with tenderness of lumbar area; without tightness of lumbar muscles    Range of Motion:   Lumbar flexion, forward flexion is 35 with pain or tenderness    Lumbar extension is 5 with pain or tenderness   Left lateral bend is Normal  without pain or tenderness   Right lateral bend is Normal without pain or tenderness   Straight leg raising is Normal   Strength & tone: Normal   Stability overall normal stability    The patient has been educated about the nature of  the problem(s) and counseled on treatment options.  The patient appeared to understand what I have discussed and is in agreement with it.  Encounter Diagnoses  Name Primary?  . Chronic right-sided low back pain with right-sided sciatica Yes  . Abdominal aortic aneurysm (AAA) without rupture [facility])     PLAN Call if any problems.  Precautions discussed.  Continue current medications.   Return to clinic to see neurosurgeon and vascular surgeon   Electronically Signed Sanjuana Kava, MD 9/18/20189:07 AM

## 2016-11-04 ENCOUNTER — Emergency Department (HOSPITAL_COMMUNITY): Payer: Medicare Other

## 2016-11-04 ENCOUNTER — Emergency Department (HOSPITAL_COMMUNITY)
Admission: EM | Admit: 2016-11-04 | Discharge: 2016-11-04 | Disposition: A | Discharge status: Home / Self Care | Payer: Medicare Other | Attending: Emergency Medicine | Admitting: Emergency Medicine

## 2016-11-04 ENCOUNTER — Encounter (HOSPITAL_COMMUNITY): Payer: Self-pay | Admitting: Cardiology

## 2016-11-04 DIAGNOSIS — Z8546 Personal history of malignant neoplasm of prostate: Secondary | ICD-10-CM | POA: Diagnosis not present

## 2016-11-04 DIAGNOSIS — Z7901 Long term (current) use of anticoagulants: Secondary | ICD-10-CM | POA: Diagnosis not present

## 2016-11-04 DIAGNOSIS — I110 Hypertensive heart disease with heart failure: Secondary | ICD-10-CM | POA: Diagnosis not present

## 2016-11-04 DIAGNOSIS — R0602 Shortness of breath: Secondary | ICD-10-CM | POA: Diagnosis not present

## 2016-11-04 DIAGNOSIS — Z950 Presence of cardiac pacemaker: Secondary | ICD-10-CM | POA: Diagnosis not present

## 2016-11-04 DIAGNOSIS — F17200 Nicotine dependence, unspecified, uncomplicated: Secondary | ICD-10-CM | POA: Diagnosis not present

## 2016-11-04 DIAGNOSIS — I5022 Chronic systolic (congestive) heart failure: Secondary | ICD-10-CM | POA: Insufficient documentation

## 2016-11-04 DIAGNOSIS — J441 Chronic obstructive pulmonary disease with (acute) exacerbation: Secondary | ICD-10-CM

## 2016-11-04 DIAGNOSIS — Z79899 Other long term (current) drug therapy: Secondary | ICD-10-CM | POA: Insufficient documentation

## 2016-11-04 LAB — COMPREHENSIVE METABOLIC PANEL
ALK PHOS: 56 U/L (ref 38–126)
ALT: 22 U/L (ref 17–63)
AST: 19 U/L (ref 15–41)
Albumin: 4.1 g/dL (ref 3.5–5.0)
Anion gap: 10 (ref 5–15)
BILIRUBIN TOTAL: 0.9 mg/dL (ref 0.3–1.2)
BUN: 16 mg/dL (ref 6–20)
CALCIUM: 8.9 mg/dL (ref 8.9–10.3)
CO2: 28 mmol/L (ref 22–32)
CREATININE: 0.7 mg/dL (ref 0.61–1.24)
Chloride: 95 mmol/L — ABNORMAL LOW (ref 101–111)
Glucose, Bld: 94 mg/dL (ref 65–99)
Potassium: 4.2 mmol/L (ref 3.5–5.1)
Sodium: 133 mmol/L — ABNORMAL LOW (ref 135–145)
TOTAL PROTEIN: 6.7 g/dL (ref 6.5–8.1)

## 2016-11-04 LAB — CBC WITH DIFFERENTIAL/PLATELET
BASOS ABS: 0 10*3/uL (ref 0.0–0.1)
Basophils Relative: 0 %
Eosinophils Absolute: 0.1 10*3/uL (ref 0.0–0.7)
Eosinophils Relative: 1 %
HEMATOCRIT: 42.3 % (ref 39.0–52.0)
Hemoglobin: 14.5 g/dL (ref 13.0–17.0)
LYMPHS ABS: 0.7 10*3/uL (ref 0.7–4.0)
Lymphocytes Relative: 11 %
MCH: 32.7 pg (ref 26.0–34.0)
MCHC: 34.3 g/dL (ref 30.0–36.0)
MCV: 95.3 fL (ref 78.0–100.0)
Monocytes Absolute: 0.9 10*3/uL (ref 0.1–1.0)
Monocytes Relative: 14 %
NEUTROS ABS: 4.8 10*3/uL (ref 1.7–7.7)
Neutrophils Relative %: 74 %
Platelets: 182 10*3/uL (ref 150–400)
RBC: 4.44 MIL/uL (ref 4.22–5.81)
RDW: 15 % (ref 11.5–15.5)
WBC: 6.5 10*3/uL (ref 4.0–10.5)

## 2016-11-04 LAB — BRAIN NATRIURETIC PEPTIDE: B Natriuretic Peptide: 1343 pg/mL — ABNORMAL HIGH (ref 0.0–100.0)

## 2016-11-04 LAB — D-DIMER, QUANTITATIVE: D-Dimer, Quant: 1.06 ug/mL-FEU — ABNORMAL HIGH (ref 0.00–0.50)

## 2016-11-04 LAB — TROPONIN I: Troponin I: <0.03 ng/mL (ref <0.03)

## 2016-11-04 MED ORDER — PREDNISONE 50 MG PO TABS
60.0000 mg | ORAL_TABLET | Freq: Once | ORAL | Status: AC
  Administered 2016-11-04: 60 mg via ORAL
  Filled 2016-11-04: qty 1

## 2016-11-04 MED ORDER — IOPAMIDOL (ISOVUE-370) INJECTION 76%
100.0000 mL | Freq: Once | INTRAVENOUS | Status: AC | PRN
  Administered 2016-11-04: 100 mL via INTRAVENOUS

## 2016-11-04 MED ORDER — FUROSEMIDE 40 MG PO TABS
40.0000 mg | ORAL_TABLET | Freq: Once | ORAL | Status: AC
  Administered 2016-11-04: 40 mg via ORAL
  Filled 2016-11-04: qty 1

## 2016-11-04 MED ORDER — PREDNISONE 20 MG PO TABS: 20.0000 mg | ORAL_TABLET | Freq: Every day | ORAL | 0 refills | Status: DC

## 2016-11-04 NOTE — ED Notes (Signed)
Pt discharged home with family with prescription and AVS reviewed with pt and family with verbalized understandings.  PIV DC with hemostasis.

## 2016-11-04 NOTE — Discharge Instructions (Signed)
Take your lasix daily.  Go to your family md this week and bring your medicine

## 2016-11-04 NOTE — ED Triage Notes (Signed)
Worsening  weakness.  Has been seeing his PCP for same.   SOB since yesterday.  Denies any pain.

## 2016-11-04 NOTE — ED Provider Notes (Signed)
Cody George Provider Note   CSN: [id number] Arrival date & time: 11/04/16  1702     History   Chief Complaint Chief Complaint  Patient presents with  . Shortness of Breath    HPI Cody George is a 83 y.o. male.  Patient complains of shortness of breath. She states he's been like this for a number of weeks   The history is provided by the patient.  Shortness of Breath  This is a recurrent problem. The problem occurs continuously.The current episode started more than 1 week ago. The problem has not changed since onset.Pertinent negatives include no fever, no headaches, no cough, no chest pain, no abdominal pain and no rash.    Past Medical History:  Diagnosis Date  . Acute systolic congestive heart failure ([place]) 12/07/2015  . Arthritis   . Cellulitis   . Community acquired pneumonia 04/14/2014  . Deep vein thrombosis (DVT) of left upper extremity ([place]) 04/13/2016  . Depression 01/06/2016  . Dysrhythmia    PAF  . GERD (gastroesophageal reflux disease)   . History of nuclear stress test 10/05/2010   dipyridamole; normal pattern of perfusion in all regions; no significant ishcemia, low risk scan   . Hypertension   . OA (osteoarthritis)   . Pacemaker 07/22/2011   St. 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 Paroxysmal atrial fibrillation (HCC)   . Prostate cancer ([place])    radiation  . Shortness of breath     Patient Active Problem List   Diagnosis Date Noted  . Venous insufficiency 10/11/2016  . Chronic systolic CHF (congestive heart failure) ([place]) 04/13/2016  . Deep vein thrombosis (DVT) of left upper extremity ([place]) 04/13/2016  . Left upper extremity swelling 04/13/2016  . SSS (sick sinus syndrome) ([place]) 03/15/2016  . Nonischemic cardiomyopathy ([place]) 01/06/2016  . Depression 01/06/2016  . Acute systolic congestive heart failure ([place]) 12/07/2015  . Pre-operative clearance 11/18/2015  . Blood clotting disorder  ([place]) 11/18/2015  . Fatigue 11/04/2015  . Cough 11/04/2015  . Aortic stenosis 11/04/2015  . Shortness of breath 07/09/2014  . Pleural effusion 04/14/2014  . Community acquired pneumonia 04/14/2014  . Paroxysmal A-fib ([place]) 12/06/2012  . Sinus arrest 12/06/2012  . Chronic anticoagulation 12/06/2012  . Labile hypertension 12/06/2012  . Persistent atrial fibrillation ([place]) 04/20/2012  . Prostate cancer ([place]) 12/19/2011  . Pacemaker - St Jude Accent DR RF June 2013, sinus node arrest 12/19/2011  . Prepatellar bursitis 12/19/2011    Past Surgical History:  Procedure Laterality Date  . CARDIOVERSION N/A 12/07/2015   Procedure: CARDIOVERSION;  Surgeon: Pixie Casino, MD;  Location: [facility] ENDOSCOPY;  Service: Cardiovascular;  Laterality: N/A;  . HERNIA REPAIR Bilateral   . INGUINAL HERNIA REPAIR Left 06/10/2015   Procedure: HERNIA REPAIR INGUINAL ADULT WITH MESH;  Surgeon: Aviva Signs, MD;  Location: [facility] ORS;  Service: General;  Laterality: Left;  Pt notified to arrive at 6:15am KF  . NM MYOCAR PERF WALL MOTION  10/05/10   normal  . PACEMAKER INSERTION  07/22/2011   St. 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 PERMANENT PACEMAKER INSERTION N/A 07/22/2011   Procedure: PERMANENT PACEMAKER INSERTION;  Surgeon: Sanda Klein, MD;  Location: [facility] CATH LAB;  Service: Cardiovascular;  Laterality: N/A;  . PILONIDAL CYST DRAINAGE     x2  . TEE WITHOUT CARDIOVERSION N/A 12/07/2015   Procedure: TRANSESOPHAGEAL ECHOCARDIOGRAM (TEE);  Surgeon: Pixie Casino, MD;  Location:  [facility] ENDOSCOPY;  Service: Cardiovascular;  Laterality: N/A;  . TRANSTHORACIC ECHOCARDIOGRAM  10/05/2010   EF=50-55%, normal LV systolic function; LA & RA mildly dilated; mild MR; mild TR with RV systolic pressure elevted at 30-33mmHg; AV mildly sclerotic with mild calcif of AV leaflets, mild valvular AS, mild-mod regurg; trace pulm valve regurg  . US ECHOCARDIOGRAPHY  10/05/10   LA mildly dilated,  mild TR, mild ca+ of AOV ,mild to mod. AI       Home Medications    Prior to Admission medications   Medication Sig Start Date End Date Taking? Authorizing Provider  ELIQUIS 5 MG TABS tablet TAKE ONE TABLET TWICE DAILY 10/03/16   Croitoru, Mihai, MD  furosemide (LASIX) 40 MG tablet Take 40 mg by mouth daily.    [provider]  HYDROcodone-acetaminophen (NORCO/VICODIN) 5-325 MG tablet One tablet every four hours as needed for acute pain.  Limit of five days per [place] statue. 10/18/16   Sanjuana Kava, MD  metoprolol succinate (TOPROL-XL) 50 MG 24 hr tablet Take 1 tablet (50 mg total) by mouth daily. 04/15/16   Croitoru, Mihai, MD  predniSONE (DELTASONE) 20 MG tablet Take 1 tablet (20 mg total) by mouth daily with breakfast. 11/04/16   Milton Ferguson, MD  tamsulosin (FLOMAX) 0.4 MG CAPS capsule Take 0.4 mg by mouth daily.    [provider]  vitamin B-12 (CYANOCOBALAMIN) 1000 MCG tablet Take 1,000 mcg by mouth daily.    [provider]  VITAMIN C, CALCIUM ASCORBATE, PO Take 1,000 mg by mouth daily.     [provider]  VITAMIN D, CHOLECALCIFEROL, PO Take 1,000 mg by mouth daily.     [provider]    Family History History reviewed. No pertinent family history.  Social History Social History  Substance Use Topics  . Smoking status: Current Every Day Smoker    Packs/day: 0.50    Years: 65.00  . Smokeless tobacco: Never Used     Comment: now 1/2 ppd (07/09/14)  . Alcohol use No     Allergies   Patient has no known allergies.   Review of Systems Review of Systems  Constitutional: Negative for appetite change, fatigue and fever.  HENT: Negative for congestion, ear discharge and sinus pressure.   Eyes: Negative for discharge.  Respiratory: Positive for shortness of breath. Negative for cough.   Cardiovascular: Negative for chest pain.  Gastrointestinal: Negative for abdominal pain and diarrhea.  Genitourinary: Negative for frequency  and hematuria.  Musculoskeletal: Negative for back pain.  Skin: Negative for rash.  Neurological: Negative for seizures and headaches.  Psychiatric/Behavioral: Negative for hallucinations.     Physical Exam Updated Vital Signs BP 127/87   Pulse 97   Temp 97.8 F (36.6 C) (Oral)   Resp (!) 23   Ht 5\' 8"  (1.727 m)   Wt 74.4 kg (164 lb)   SpO2 96%   BMI 24.94 kg/m   Physical Exam  Constitutional: He is oriented to person, place, and time. He appears well-developed.  HENT:  Head: Normocephalic.  Eyes: Conjunctivae and EOM are normal. No scleral icterus.  Neck: Neck supple. No thyromegaly present.  Cardiovascular: Normal rate.  Exam reveals no gallop and no friction rub.   No murmur heard. Irregular rhythm  Pulmonary/Chest: No stridor. He has wheezes. He has no rales. He exhibits no tenderness.  Abdominal: He exhibits no distension. There is no tenderness. There is no rebound.  Musculoskeletal: Normal range of motion. He exhibits no edema.  Lymphadenopathy:    He has no cervical adenopathy.  Neurological: He is oriented to person, place, and time. He exhibits normal muscle tone. Coordination normal.  Skin: No rash noted. No erythema.  Psychiatric: He has a normal mood and affect. His behavior is normal.     ED Treatments / Results  Labs (all labs ordered are listed, but only abnormal results are displayed) Labs Reviewed  COMPREHENSIVE METABOLIC PANEL - Abnormal; Notable for the following:       Result Value   Sodium 133 (*)    Chloride 95 (*)    All other components within normal limits  BRAIN NATRIURETIC PEPTIDE - Abnormal; Notable for the following:    B Natriuretic Peptide 1,343.0 (*)    All other components within normal limits  D-DIMER, QUANTITATIVE (NOT AT [facility]) - Abnormal; Notable for the following:    D-Dimer, Quant 1.06 (*)    All other components within normal limits  CBC WITH DIFFERENTIAL/PLATELET  TROPONIN I    EKG  EKG  Interpretation  Date/Time:  Friday November 04 2016 17:16:39 EDT Ventricular Rate:  113 PR Interval:    QRS Duration: 88 QT Interval:  329 QTC Calculation: 467 R Axis:   64 Text Interpretation:  6Afib/flut and V-paced complexes No further rhythm analysis attempted due to paced rhythm Nonspecific repol abnormality, diffuse leads Baseline wander in lead(s) I aVR V6 Confirmed by Milton Ferguson [phone]) on 11/04/2016 8:15:43 PM       Radiology Ct Angio Chest Pe W And/or Wo Contrast  Result Date: 11/04/2016 CLINICAL DATA:  Shortness of breath since yesterday.  Smoker. EXAM: CT ANGIOGRAPHY CHEST WITH CONTRAST TECHNIQUE: Multidetector CT imaging of the chest was performed using the standard protocol during bolus administration of intravenous contrast. Multiplanar CT image reconstructions and MIPs were obtained to evaluate the vascular anatomy. CONTRAST:  100 cc Isovue 370 COMPARISON:  Portable chest obtained earlier today. Chest CT dated 04/14/2014. FINDINGS: Cardiovascular: Several small, linear filling defects in the right lower lobe pulmonary arteries. These are nonocclusive. Atheromatous arterial calcifications, including the coronary arteries and aorta. Mediastinum/Nodes: No enlarged mediastinal, hilar, or axillary lymph nodes. Thyroid gland, trachea, and esophagus demonstrate no significant findings. Lungs/Pleura: Posteromedial right lower lobe endobronchial occluding soft tissue and atelectasis. The endobronchial soft tissue density is not unchanged. Small left pleural effusion. The lungs are mildly hyperexpanded. Upper Abdomen: Reflux of contrast into the inferior vena cava and hepatic veins. Musculoskeletal: Thoracic and lower cervical spine degenerative changes. Review of the MIP images confirms the above findings. IMPRESSION: 1. Several small, linear right lower lobe pulmonary arterial filling defects. These are nonocclusive and are compatible with subacute emboli. No definite acute pulmonary  emboli seen. 2. Chronic soft tissue density filling a right lower lobe bronchus with associated atelectasis. The chronicity is compatible with a chronic mucous plug or scarring rather than a neoplastic process. 3. Small left pleural effusion. 4. Mild changes of COPD. 5. Calcified coronary artery and aortic atherosclerosis. Aortic Atherosclerosis (ICD10-I70.0) and Emphysema (ICD10-J43.9). Electronically Signed   By: Claudie Revering M.D.   On: 11/04/2016 19:52   Dg Chest Portable 1 View  Result Date: 11/04/2016 CLINICAL DATA:  Shortness of breath. EXAM: PORTABLE CHEST 1 VIEW COMPARISON:  Chest x-ray dated November 04, 2015. FINDINGS: The cardiomediastinal silhouette remains mildly enlarged. Unchanged left chest wall AICD. Atherosclerotic calcification of the aortic arch. Mild pulmonary vascular congestion. No pleural effusion or pneumothorax. Bibasilar atelectasis. No acute osseous abnormality. IMPRESSION: Cardiomegaly and mild pulmonary vascular congestion without overt  pulmonary edema. Electronically Signed   By: Titus Dubin M.D.   On: 11/04/2016 17:51    Procedures Procedures (including critical care time)  Medications Ordered in ED Medications  furosemide (LASIX) tablet 40 mg (not administered)  predniSONE (DELTASONE) tablet 60 mg (not administered)  iopamidol (ISOVUE-370) 76 % injection 100 mL (100 mLs Intravenous Contrast Given 11/04/16 1919)     Initial Impression / Assessment and Plan / ED Course  I have reviewed the triage vital signs and the nursing notes.  Pertinent labs & imaging results that were available during my care of the patient were reviewed by me and considered in my medical decision making (see chart for details).     Patient has some heart failure and some COPD CT scan does not show any new PE. Patient is presently taking metoprolol Lasix eliquis. It appeared the patient is not taking his medicines properly. I don't believe he is taking the Lasix is some question  whether he is taking any metoprolol he does state that he is taking the blood thinner. He had some heart failure and COPD I will put him on prednisone I have stressed to him taking his Lasix daily and he is to see his family doctor next week and go over all his medicines. Patient refuses staying in the hospital.  Final Clinical Impressions(s) / ED Diagnoses   Final diagnoses:  COPD exacerbation ([place])    New Prescriptions New Prescriptions   PREDNISONE (DELTASONE) 20 MG TABLET    Take 1 tablet (20 mg total) by mouth daily with breakfast.     Milton Ferguson, MD 11/04/16 2031

## 2016-11-04 NOTE — ED Notes (Signed)
X-ray at bedside

## 2016-11-05 ENCOUNTER — Other Ambulatory Visit: Payer: Self-pay | Admitting: Internal Medicine

## 2016-11-07 NOTE — Telephone Encounter (Signed)
Rx(s) sent to pharmacy electronically.  

## 2016-11-23 ENCOUNTER — Encounter: Payer: Self-pay | Admitting: Vascular Surgery

## 2016-11-23 ENCOUNTER — Ambulatory Visit (INDEPENDENT_AMBULATORY_CARE_PROVIDER_SITE_OTHER): Payer: Medicare Other | Admitting: Vascular Surgery

## 2016-11-23 VITALS — BP 124/87 | HR 147 | Temp 95.6°F | Resp 14 | Ht 68.0 in | Wt 167.0 lb

## 2016-11-23 DIAGNOSIS — I723 Aneurysm of iliac artery: Secondary | ICD-10-CM

## 2016-11-23 DIAGNOSIS — I713 Abdominal aortic aneurysm, ruptured, unspecified: Secondary | ICD-10-CM

## 2016-11-23 DIAGNOSIS — I714 Abdominal aortic aneurysm, without rupture, unspecified: Secondary | ICD-10-CM

## 2016-11-23 NOTE — Progress Notes (Signed)
Referring Physician: Dr Luna Glasgow  Patient name: Cody George MRN: [id number] DOB: [birth year] Sex: male  REASON FOR CONSULT: Abdominal aortic and iliac aneurysm  HPI: Cody George is a 83 y.o. male who recently had a CT scan for evaluation of back pain. He was noted to have abdominal aortic and iliac artery aneurysm at the time of that CT scan. He states that he has chronic back pain. He has been told that this is primarily degenerative arthritis. He denies any abdominal pain. He has no family history of abdominal aortic aneurysm. He does have fairly significant COPD. He states that on walking one block on flat ground he would need to stop 2 times to catch his breath. He cannot climb any stairs without becoming short of breath. He previously had a cardiac pacemaker placed and states that he had improvement of his energy level after this. He states that currently he does not have much energy. Other medical problems include hypertension and paroxysmal atrial fibrillation. These have both been stable. He did undergo an inguinal hernia repair about a year ago and states he did okay with the operation. He is on Eliquis for his atrial fibrillation.  Past Medical History:  Diagnosis Date  . Acute systolic congestive heart failure ([place]) 12/07/2015  . Arthritis   . Cellulitis   . Community acquired pneumonia 04/14/2014  . Deep vein thrombosis (DVT) of left upper extremity ([place]) 04/13/2016  . Depression 01/06/2016  . Dysrhythmia    PAF  . GERD (gastroesophageal reflux disease)   . History of nuclear stress test 10/05/2010   dipyridamole; normal pattern of perfusion in all regions; no significant ishcemia, low risk scan   . Hypertension   . OA (osteoarthritis)   . Pacemaker 07/22/2011   St. 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 Paroxysmal atrial fibrillation (HCC)   . Prostate cancer ([place])    radiation  . Shortness of breath    Past Surgical  History:  Procedure Laterality Date  . CARDIOVERSION N/A 12/07/2015   Procedure: CARDIOVERSION;  Surgeon: Pixie Casino, MD;  Location: [facility] ENDOSCOPY;  Service: Cardiovascular;  Laterality: N/A;  . HERNIA REPAIR Bilateral   . INGUINAL HERNIA REPAIR Left 06/10/2015   Procedure: HERNIA REPAIR INGUINAL ADULT WITH MESH;  Surgeon: Aviva Signs, MD;  Location: [facility] ORS;  Service: General;  Laterality: Left;  Pt notified to arrive at 6:15am KF  . NM MYOCAR PERF WALL MOTION  10/05/10   normal  . PACEMAKER INSERTION  07/22/2011   St. 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 PERMANENT PACEMAKER INSERTION N/A 07/22/2011   Procedure: PERMANENT PACEMAKER INSERTION;  Surgeon: Sanda Klein, MD;  Location: [facility] CATH LAB;  Service: Cardiovascular;  Laterality: N/A;  . PILONIDAL CYST DRAINAGE     x2  . TEE WITHOUT CARDIOVERSION N/A 12/07/2015   Procedure: TRANSESOPHAGEAL ECHOCARDIOGRAM (TEE);  Surgeon: Pixie Casino, MD;  Location: [facility] ENDOSCOPY;  Service: Cardiovascular;  Laterality: N/A;  . TRANSTHORACIC ECHOCARDIOGRAM  10/05/2010   EF=50-55%, normal LV systolic function; LA & RA mildly dilated; mild MR; mild TR with RV systolic pressure elevted at 30-87mmHg; AV mildly sclerotic with mild calcif of AV leaflets, mild valvular AS, mild-mod regurg; trace pulm valve regurg  . US ECHOCARDIOGRAPHY  10/05/10   LA mildly dilated, mild TR, mild ca+ of AOV ,mild to mod. AI    No family history on file.  SOCIAL HISTORY:  Social History   Social History  . Marital status: Married    Spouse name: N/A  . Number of children: 5  . Years of education: N/A   Occupational History  .  Retired   Social History Main Topics  . Smoking status: Current Every Day Smoker    Packs/day: 0.50    Years: 65.00  . Smokeless tobacco: Never Used     Comment: now 1/2 ppd (07/09/14)  . Alcohol use No  . Drug use: No  . Sexual activity: Not Currently   Other Topics Concern  . Not on file    Social History Narrative  . No narrative on file    No Known Allergies  Current Outpatient Prescriptions  Medication Sig Dispense Refill  . ELIQUIS 5 MG TABS tablet TAKE ONE TABLET TWICE DAILY 180 tablet 1  . furosemide (LASIX) 40 MG tablet Take 40 mg by mouth daily.    . furosemide (LASIX) 40 MG tablet TAKE ONE (1) TABLET EACH DAY 30 tablet 11  . HYDROcodone-acetaminophen (NORCO/VICODIN) 5-325 MG tablet One tablet every four hours as needed for acute pain.  Limit of five days per  statue. 30 tablet 0  . metoprolol succinate (TOPROL-XL) 50 MG 24 hr tablet Take 1 tablet (50 mg total) by mouth daily. 90 tablet 3  . predniSONE (DELTASONE) 20 MG tablet Take 1 tablet (20 mg total) by mouth daily with breakfast. 14 tablet 0  . tamsulosin (FLOMAX) 0.4 MG CAPS capsule Take 0.4 mg by mouth daily.    . vitamin B-12 (CYANOCOBALAMIN) 1000 MCG tablet Take 1,000 mcg by mouth daily.    Marland Kitchen VITAMIN C, CALCIUM ASCORBATE, PO Take 1,000 mg by mouth daily.     Marland Kitchen VITAMIN D, CHOLECALCIFEROL, PO Take 1,000 mg by mouth daily.      No current facility-administered medications for this visit.     ROS:   General:  No weight loss, Fever, chills  HEENT: No recent headaches, no nasal bleeding, no visual changes, no sore throat  Neurologic: No dizziness, blackouts, seizures. No recent symptoms of stroke or mini- stroke. No recent episodes of slurred speech, or temporary blindness.  Cardiac: No recent episodes of chest pain/pressure, no shortness of breath at rest.  + shortness of breath with exertion.  Denies history of atrial fibrillation or irregular heartbeat  Vascular: No history of rest pain in feet.  No history of claudication.  No history of non-healing ulcer, No history of DVT   Pulmonary: No home oxygen, + productive cough, no hemoptysis,  + asthma or wheezing  Musculoskeletal:  [X]  Arthritis, [X]  Low back pain,  [X]  Joint pain  Hematologic:No history of hypercoagulable state.  No history  of easy bleeding.  No history of anemia  Gastrointestinal: No hematochezia or melena,  No gastroesophageal reflux, no trouble swallowing  Urinary: [ ]  chronic Kidney disease, [ ]  on HD - [ ]  MWF or [ ]  TTHS, [ ]  Burning with urination, [ ]  Frequent urination, [ ]  Difficulty urinating;   Skin: No rashes  Psychological: No history of anxiety,  No history of depression   Physical Examination Vitals:   11/23/16 1235  BP: 124/87  Pulse: (!) 147  Resp: 14  Temp: (!) 95.6 F (35.3 C)  SpO2: 95%  Weight: 167 lb (75.8 kg)  Height: 5\' 8"  (1.727 m)    General:  Alert and oriented, no acute distress HEENT: Normal Neck: No bruit or JVD Pulmonary: Clear to auscultation bilaterally Cardiac: Regular Rate and  Rhythm without murmur Abdomen: Soft, non-tender, non-distended, no mass Skin: No rash Extremity Pulses:  2+ radial, brachial,2+ right absent left femoral left femoral artery feels extremely calcified on palpation, absent popliteal dorsalis pedis, posterior tibial pulses bilaterally Musculoskeletal: No deformity trace pretibial and pedal edema bilaterally  Neurologic: Upper and lower extremity motor 5/5 and symmetric  DATA:  I reviewed the images of the patient's recent CT scan of his lumbar spine. This showed a 4.7 cm abdominal aortic aneurysm and a 3.4 cm left common iliac aneurysm  ASSESSMENT:  Asymptomatic 4.7 cm abdominal aortic aneurysm. 3.4 cm left common iliac aneurysm.   PLAN:  The patient's prior CT scan was a dedicated lumbar spine CT and we have not fully evaluated his aortoiliac arteries so this will need a CT Angiogram to further define this. Consideration will then be given for whether or not he needs repair. In light of the fact he may require an operation for this I have also scheduled him for an evaluation by pulmonology since he has fairly severe COPD. He will return after his CT scan in pulmonary follow-up.   Ruta Hinds, MD [facility]  [place] Office: [phone] Pager: [phone]

## 2016-11-23 NOTE — Addendum Note (Signed)
Addended by: Lianne Cure A on: 11/23/2016 03:30 PM   Modules accepted: Orders

## 2016-11-24 ENCOUNTER — Telehealth: Payer: Self-pay | Admitting: Radiology

## 2016-11-24 NOTE — Telephone Encounter (Signed)
[facility] called to schedule the patient and he refused, stating he is better and does not need them at this time.

## 2016-11-26 ENCOUNTER — Emergency Department (HOSPITAL_COMMUNITY): Payer: Medicare Other

## 2016-11-26 ENCOUNTER — Inpatient Hospital Stay (HOSPITAL_COMMUNITY)
Admission: EM | Admit: 2016-11-26 | Discharge: 2016-12-01 | DRG: 292 | Disposition: A | Discharge status: Home / Self Care | Payer: Medicare Other | Attending: Internal Medicine | Admitting: Internal Medicine

## 2016-11-26 ENCOUNTER — Encounter (HOSPITAL_COMMUNITY): Payer: Self-pay | Admitting: Emergency Medicine

## 2016-11-26 DIAGNOSIS — I429 Cardiomyopathy, unspecified: Secondary | ICD-10-CM | POA: Diagnosis not present

## 2016-11-26 DIAGNOSIS — I5023 Acute on chronic systolic (congestive) heart failure: Secondary | ICD-10-CM | POA: Diagnosis present

## 2016-11-26 DIAGNOSIS — J9809 Other diseases of bronchus, not elsewhere classified: Secondary | ICD-10-CM | POA: Diagnosis present

## 2016-11-26 DIAGNOSIS — L89151 Pressure ulcer of sacral region, stage 1: Secondary | ICD-10-CM | POA: Diagnosis present

## 2016-11-26 DIAGNOSIS — T17990A Other foreign object in respiratory tract, part unspecified in causing asphyxiation, initial encounter: Secondary | ICD-10-CM | POA: Diagnosis present

## 2016-11-26 DIAGNOSIS — R0600 Dyspnea, unspecified: Secondary | ICD-10-CM | POA: Diagnosis not present

## 2016-11-26 DIAGNOSIS — F17210 Nicotine dependence, cigarettes, uncomplicated: Secondary | ICD-10-CM | POA: Diagnosis present

## 2016-11-26 DIAGNOSIS — R918 Other nonspecific abnormal finding of lung field: Secondary | ICD-10-CM | POA: Diagnosis not present

## 2016-11-26 DIAGNOSIS — E871 Hypo-osmolality and hyponatremia: Secondary | ICD-10-CM | POA: Diagnosis present

## 2016-11-26 DIAGNOSIS — I110 Hypertensive heart disease with heart failure: Secondary | ICD-10-CM | POA: Diagnosis not present

## 2016-11-26 DIAGNOSIS — Z8546 Personal history of malignant neoplasm of prostate: Secondary | ICD-10-CM

## 2016-11-26 DIAGNOSIS — I509 Heart failure, unspecified: Secondary | ICD-10-CM | POA: Diagnosis not present

## 2016-11-26 DIAGNOSIS — Z923 Personal history of irradiation: Secondary | ICD-10-CM | POA: Diagnosis not present

## 2016-11-26 DIAGNOSIS — I714 Abdominal aortic aneurysm, without rupture, unspecified: Secondary | ICD-10-CM | POA: Diagnosis present

## 2016-11-26 DIAGNOSIS — Z7901 Long term (current) use of anticoagulants: Secondary | ICD-10-CM | POA: Diagnosis not present

## 2016-11-26 DIAGNOSIS — Z950 Presence of cardiac pacemaker: Secondary | ICD-10-CM

## 2016-11-26 DIAGNOSIS — I4891 Unspecified atrial fibrillation: Secondary | ICD-10-CM | POA: Diagnosis not present

## 2016-11-26 DIAGNOSIS — I480 Paroxysmal atrial fibrillation: Secondary | ICD-10-CM | POA: Diagnosis present

## 2016-11-26 DIAGNOSIS — R109 Unspecified abdominal pain: Secondary | ICD-10-CM | POA: Diagnosis not present

## 2016-11-26 DIAGNOSIS — J449 Chronic obstructive pulmonary disease, unspecified: Secondary | ICD-10-CM | POA: Diagnosis not present

## 2016-11-26 DIAGNOSIS — G8929 Other chronic pain: Secondary | ICD-10-CM | POA: Diagnosis not present

## 2016-11-26 DIAGNOSIS — I340 Nonrheumatic mitral (valve) insufficiency: Secondary | ICD-10-CM | POA: Diagnosis not present

## 2016-11-26 DIAGNOSIS — K769 Liver disease, unspecified: Secondary | ICD-10-CM | POA: Diagnosis present

## 2016-11-26 DIAGNOSIS — I481 Persistent atrial fibrillation: Secondary | ICD-10-CM | POA: Diagnosis present

## 2016-11-26 DIAGNOSIS — L8990 Pressure ulcer of unspecified site, unspecified stage: Secondary | ICD-10-CM | POA: Insufficient documentation

## 2016-11-26 DIAGNOSIS — Z86718 Personal history of other venous thrombosis and embolism: Secondary | ICD-10-CM | POA: Diagnosis not present

## 2016-11-26 LAB — COMPREHENSIVE METABOLIC PANEL
ALBUMIN: 4 g/dL (ref 3.5–5.0)
ALT: 26 U/L (ref 17–63)
AST: 24 U/L (ref 15–41)
Alkaline Phosphatase: 59 U/L (ref 38–126)
Anion gap: 9 (ref 5–15)
BUN: 21 mg/dL — ABNORMAL HIGH (ref 6–20)
CHLORIDE: 94 mmol/L — AB (ref 101–111)
CO2: 30 mmol/L (ref 22–32)
CREATININE: 0.79 mg/dL (ref 0.61–1.24)
Calcium: 9.2 mg/dL (ref 8.9–10.3)
GFR calc non Af Amer: >60 mL/min (ref >60)
GLUCOSE: 119 mg/dL — AB (ref 65–99)
Potassium: 4.3 mmol/L (ref 3.5–5.1)
SODIUM: 133 mmol/L — AB (ref 135–145)
Total Bilirubin: 1.2 mg/dL (ref 0.3–1.2)
Total Protein: 6.1 g/dL — ABNORMAL LOW (ref 6.5–8.1)

## 2016-11-26 LAB — CBC WITH DIFFERENTIAL/PLATELET
BASOS ABS: 0 10*3/uL (ref 0.0–0.1)
Basophils Relative: 0 %
EOS ABS: 0 10*3/uL (ref 0.0–0.7)
Eosinophils Relative: 0 %
HCT: 41.8 % (ref 39.0–52.0)
HEMOGLOBIN: 14.2 g/dL (ref 13.0–17.0)
LYMPHS ABS: 0.5 10*3/uL — AB (ref 0.7–4.0)
Lymphocytes Relative: 5 %
MCH: 32.5 pg (ref 26.0–34.0)
MCHC: 34 g/dL (ref 30.0–36.0)
MCV: 95.7 fL (ref 78.0–100.0)
MONOS PCT: 12 %
Monocytes Absolute: 1.2 10*3/uL — ABNORMAL HIGH (ref 0.1–1.0)
NEUTROS PCT: 83 %
Neutro Abs: 8.2 10*3/uL — ABNORMAL HIGH (ref 1.7–7.7)
PLATELETS: 144 10*3/uL — AB (ref 150–400)
RBC: 4.37 MIL/uL (ref 4.22–5.81)
RDW: 15 % (ref 11.5–15.5)
WBC: 9.9 10*3/uL (ref 4.0–10.5)

## 2016-11-26 LAB — URINALYSIS, ROUTINE W REFLEX MICROSCOPIC
Bilirubin Urine: NEGATIVE
Glucose, UA: NEGATIVE mg/dL
Hgb urine dipstick: NEGATIVE
Ketones, ur: NEGATIVE mg/dL
LEUKOCYTES UA: NEGATIVE
Nitrite: NEGATIVE
PROTEIN: NEGATIVE mg/dL
Specific Gravity, Urine: 1.01 (ref 1.005–1.030)
pH: 7 (ref 5.0–8.0)

## 2016-11-26 LAB — BRAIN NATRIURETIC PEPTIDE: B NATRIURETIC PEPTIDE 5: 1175 pg/mL — AB (ref 0.0–100.0)

## 2016-11-26 LAB — TROPONIN I: Troponin I: <0.03 ng/mL (ref <0.03)

## 2016-11-26 MED ORDER — METOPROLOL SUCCINATE ER 50 MG PO TB24: 50.0000 mg | ORAL_TABLET | Freq: Every day | ORAL | Status: DC

## 2016-11-26 MED ORDER — ACETAMINOPHEN 325 MG PO TABS: 650.0000 mg | ORAL_TABLET | ORAL | Status: DC | PRN

## 2016-11-26 MED ORDER — APIXABAN 5 MG PO TABS
5.0000 mg | ORAL_TABLET | Freq: Two times a day (BID) | ORAL | Status: DC
  Administered 2016-11-26 – 2016-12-01 (×10): 5 mg via ORAL
  Filled 2016-11-26 (×9): qty 1

## 2016-11-26 MED ORDER — TAMSULOSIN HCL 0.4 MG PO CAPS
0.4000 mg | ORAL_CAPSULE | Freq: Every day | ORAL | Status: DC
  Administered 2016-11-27 – 2016-11-30 (×4): 0.4 mg via ORAL
  Filled 2016-11-26 (×4): qty 1

## 2016-11-26 MED ORDER — DILTIAZEM HCL-DEXTROSE 100-5 MG/100ML-% IV SOLN (PREMIX)
5.0000 mg/h | INTRAVENOUS | Status: DC
  Administered 2016-11-26: 5 mg/h via INTRAVENOUS
  Administered 2016-11-26 – 2016-11-27 (×4): 15 mg/h via INTRAVENOUS
  Administered 2016-11-28: 5 mg/h via INTRAVENOUS
  Administered 2016-11-28: 10 mg/h via INTRAVENOUS
  Filled 2016-11-26 (×8): qty 100

## 2016-11-26 MED ORDER — HYDROCODONE-ACETAMINOPHEN 5-325 MG PO TABS: 1.0000 | ORAL_TABLET | Freq: Four times a day (QID) | ORAL | Status: DC | PRN

## 2016-11-26 MED ORDER — IPRATROPIUM-ALBUTEROL 0.5-2.5 (3) MG/3ML IN SOLN
3.0000 mL | Freq: Once | RESPIRATORY_TRACT | Status: AC
  Administered 2016-11-26: 3 mL via RESPIRATORY_TRACT
  Filled 2016-11-26: qty 3

## 2016-11-26 MED ORDER — FUROSEMIDE 10 MG/ML IJ SOLN
40.0000 mg | Freq: Once | INTRAMUSCULAR | Status: AC
Start: 2016-11-26 — End: 2016-11-26
  Administered 2016-11-26: 40 mg via INTRAVENOUS
  Filled 2016-11-26: qty 4

## 2016-11-26 MED ORDER — SODIUM CHLORIDE 0.9 % IV SOLN: 250.0000 mL | INTRAVENOUS | Status: DC | PRN

## 2016-11-26 MED ORDER — DILTIAZEM LOAD VIA INFUSION
5.0000 mg | Freq: Once | INTRAVENOUS | Status: AC
  Administered 2016-11-26: 5 mg via INTRAVENOUS
  Filled 2016-11-26: qty 5

## 2016-11-26 MED ORDER — IOPAMIDOL (ISOVUE-370) INJECTION 76%
100.0000 mL | Freq: Once | INTRAVENOUS | Status: AC | PRN
  Administered 2016-11-26: 100 mL via INTRAVENOUS

## 2016-11-26 MED ORDER — MIRTAZAPINE 30 MG PO TABS
30.0000 mg | ORAL_TABLET | Freq: Every day | ORAL | Status: DC
  Administered 2016-11-26 – 2016-11-30 (×5): 30 mg via ORAL
  Filled 2016-11-26 (×5): qty 1

## 2016-11-26 MED ORDER — VITAMIN B-12 1000 MCG PO TABS
1000.0000 ug | ORAL_TABLET | Freq: Every day | ORAL | Status: DC
  Administered 2016-11-27 – 2016-12-01 (×5): 1000 ug via ORAL
  Filled 2016-11-26 (×5): qty 1

## 2016-11-26 MED ORDER — ALPRAZOLAM 0.25 MG PO TABS
0.2500 mg | ORAL_TABLET | Freq: Two times a day (BID) | ORAL | Status: DC | PRN
  Administered 2016-11-28: 0.25 mg via ORAL
  Filled 2016-11-26: qty 1

## 2016-11-26 MED ORDER — FUROSEMIDE 10 MG/ML IJ SOLN
40.0000 mg | Freq: Two times a day (BID) | INTRAMUSCULAR | Status: DC
  Administered 2016-11-27 – 2016-11-29 (×5): 40 mg via INTRAVENOUS
  Filled 2016-11-26 (×5): qty 4

## 2016-11-26 MED ORDER — SODIUM CHLORIDE 0.9% FLUSH: 3.0000 mL | INTRAVENOUS | Status: DC | PRN

## 2016-11-26 MED ORDER — ONDANSETRON HCL 4 MG/2ML IJ SOLN: 4.0000 mg | Freq: Four times a day (QID) | INTRAMUSCULAR | Status: DC | PRN

## 2016-11-26 MED ORDER — SODIUM CHLORIDE 0.9% FLUSH
3.0000 mL | Freq: Two times a day (BID) | INTRAVENOUS | Status: DC
  Administered 2016-11-26 – 2016-12-01 (×10): 3 mL via INTRAVENOUS

## 2016-11-26 NOTE — ED Notes (Signed)
Family at bedside. 

## 2016-11-26 NOTE — ED Notes (Signed)
Patient transported to CT 

## 2016-11-26 NOTE — ED Notes (Signed)
ED Provider at bedside. 

## 2016-11-26 NOTE — ED Notes (Signed)
Post residual is 145.  MD notified.

## 2016-11-26 NOTE — ED Provider Notes (Signed)
[facility] DEPT Provider Note   CSN: [id number] Arrival date & time: 11/26/16  1630     History   Chief Complaint Chief Complaint  Patient presents with  . Abdominal Pain    Cody George is a 83 y.o. male.  Cody Patient presents with abdominal pain and swelling in his legs. Swelling in both his lower legs but worse on the right side. States there is always some swelling there but over the last week he's put on around 10 pounds. Some difficulty breathing also.decreased urination. No fevers. Has had a cough that he is had for 40 years per family member. He is a smoker. Has dull abdominal pain and is slow to midabdomen and goes to the back. He's had pains like this for a while now. Has a known aortic and iliac aneurysm. He was however found on a spinal CT and she is scheduled for a CT angiography by vascular surgery. He is on Eliquis for his A. Fib and he has a pacemaker. Past Medical History:  Diagnosis Date  . Acute systolic congestive heart failure ([place]) 12/07/2015  . Arthritis   . Cellulitis   . Community acquired pneumonia 04/14/2014  . Deep vein thrombosis (DVT) of left upper extremity ([place]) 04/13/2016  . Depression 01/06/2016  . Dysrhythmia    PAF  . GERD (gastroesophageal reflux disease)   . History of nuclear stress test 10/05/2010   dipyridamole; normal pattern of perfusion in all regions; no significant ishcemia, low risk scan   . Hypertension   . OA (osteoarthritis)   . Pacemaker 07/22/2011   St. 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 Paroxysmal atrial fibrillation (HCC)   . Prostate cancer ([place])    radiation  . Shortness of breath     Patient Active Problem List   Diagnosis Date Noted  . Venous insufficiency 10/11/2016  . Chronic systolic CHF (congestive heart failure) ([place]) 04/13/2016  . Deep vein thrombosis (DVT) of left upper extremity ([place]) 04/13/2016  . Left upper extremity swelling 04/13/2016  . SSS  (sick sinus syndrome) ([place]) 03/15/2016  . Nonischemic cardiomyopathy ([place]) 01/06/2016  . Depression 01/06/2016  . Acute systolic congestive heart failure ([place]) 12/07/2015  . Pre-operative clearance 11/18/2015  . Blood clotting disorder ([place]) 11/18/2015  . Fatigue 11/04/2015  . Cough 11/04/2015  . Aortic stenosis 11/04/2015  . Shortness of breath 07/09/2014  . Pleural effusion 04/14/2014  . Community acquired pneumonia 04/14/2014  . Paroxysmal A-fib ([place]) 12/06/2012  . Sinus arrest 12/06/2012  . Chronic anticoagulation 12/06/2012  . Labile hypertension 12/06/2012  . Persistent atrial fibrillation ([place]) 04/20/2012  . Prostate cancer ([place]) 12/19/2011  . Pacemaker - St Jude Accent DR RF June 2013, sinus node arrest 12/19/2011  . Prepatellar bursitis 12/19/2011    Past Surgical History:  Procedure Laterality Date  . CARDIOVERSION N/A 12/07/2015   Procedure: CARDIOVERSION;  Surgeon: Pixie Casino, MD;  Location: [facility] ENDOSCOPY;  Service: Cardiovascular;  Laterality: N/A;  . HERNIA REPAIR Bilateral   . INGUINAL HERNIA REPAIR Left 06/10/2015   Procedure: HERNIA REPAIR INGUINAL ADULT WITH MESH;  Surgeon: Aviva Signs, MD;  Location: [facility] ORS;  Service: General;  Laterality: Left;  Pt notified to arrive at 6:15am KF  . NM MYOCAR PERF WALL MOTION  10/05/10   normal  . PACEMAKER INSERTION  07/22/2011   St. 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 PERMANENT PACEMAKER INSERTION N/A 07/22/2011  Procedure: PERMANENT PACEMAKER INSERTION;  Surgeon: Sanda Klein, MD;  Location: [facility] CATH LAB;  Service: Cardiovascular;  Laterality: N/A;  . PILONIDAL CYST DRAINAGE     x2  . TEE WITHOUT CARDIOVERSION N/A 12/07/2015   Procedure: TRANSESOPHAGEAL ECHOCARDIOGRAM (TEE);  Surgeon: Pixie Casino, MD;  Location: [facility] ENDOSCOPY;  Service: Cardiovascular;  Laterality: N/A;  . TRANSTHORACIC ECHOCARDIOGRAM  10/05/2010   EF=50-55%, normal LV systolic function; LA & RA mildly  dilated; mild MR; mild TR with RV systolic pressure elevted at 30-51mmHg; AV mildly sclerotic with mild calcif of AV leaflets, mild valvular AS, mild-mod regurg; trace pulm valve regurg  . US ECHOCARDIOGRAPHY  10/05/10   LA mildly dilated, mild TR, mild ca+ of AOV ,mild to mod. AI       Home Medications    Prior to Admission medications   Medication Sig Start Date End Date Taking? Authorizing Provider  ELIQUIS 5 MG TABS tablet TAKE ONE TABLET TWICE DAILY 10/03/16  Yes Croitoru, Mihai, MD  furosemide (LASIX) 40 MG tablet TAKE ONE (1) TABLET EACH DAY 11/07/16  Yes Hilty, Nadean Corwin, MD  HYDROcodone-acetaminophen (NORCO/VICODIN) 5-325 MG tablet One tablet every four hours as needed for acute pain.  Limit of five days per [place]. 10/18/16  Yes Sanjuana Kava, MD  mirtazapine (REMERON) 30 MG tablet Take 30 mg by mouth at bedtime.  11/15/16  Yes [provider]  tamsulosin (FLOMAX) 0.4 MG CAPS capsule Take 0.4 mg by mouth every evening.    Yes [provider]  vitamin B-12 (CYANOCOBALAMIN) 1000 MCG tablet Take 1,000 mcg by mouth daily.   Yes [provider]  VITAMIN C, CALCIUM ASCORBATE, PO Take 1,000 mg by mouth daily.    Yes [provider]  VITAMIN D, CHOLECALCIFEROL, PO Take 1,000 mg by mouth daily.    Yes [provider]  metoprolol succinate (TOPROL-XL) 50 MG 24 hr tablet Take 1 tablet (50 mg total) by mouth daily. 04/15/16   Croitoru, Mihai, MD  predniSONE (DELTASONE) 20 MG tablet Take 1 tablet (20 mg total) by mouth daily with breakfast. Patient not taking: Reported on 11/23/2016 11/04/16   Milton Ferguson, MD    Family History No family history on file.  Social History Social History  Substance Use Topics  . Smoking status: Current Every Day Smoker    Packs/day: 0.50    Years: 65.00  . Smokeless tobacco: Never Used     Comment: now 1/2 ppd (07/09/14)  . Alcohol use No     Allergies   Patient has no known allergies.   Review of  Systems Review of Systems  Constitutional: Positive for fatigue. Negative for activity change.  HENT: Negative for congestion.   Respiratory: Positive for cough and shortness of breath.   Cardiovascular: Positive for leg swelling.  Gastrointestinal: Positive for abdominal pain. Negative for diarrhea, nausea and vomiting.  Endocrine: Negative for polyuria.  Genitourinary: Positive for difficulty urinating. Negative for flank pain.  Musculoskeletal: Positive for back pain.  Skin: Negative for rash.  Neurological: Negative for weakness and numbness.  Psychiatric/Behavioral: Negative for confusion.     Physical Exam Updated Vital Signs BP 107/79   Pulse 92   Temp 97.8 F (36.6 C) (Oral)   Resp (!) 22   Ht 5\' 8"  (1.727 m)   Wt 76.7 kg (169 lb)   SpO2 95%   BMI 25.70 kg/m   Physical Exam  Constitutional: He appears well-developed.  HENT:  Head: Atraumatic.  Eyes: Pupils are equal,  round, and reactive to light.  Neck: JVD present.  Cardiovascular:  Irregular tachycardia  Pulmonary/Chest:  Diffuse wheezes and prolonged expirations  Abdominal: He exhibits mass.  Potential suprapubic fullness. No rebound or guarding.  Musculoskeletal: He exhibits edema.  Moderate to severe pitting edema bilateral lower extremities but worse on the right side. No erythema.  Neurological: He is alert.  Skin: Skin is warm. Capillary refill takes less than 2 seconds.     ED Treatments / Results  Labs (all labs ordered are listed, but only abnormal results are displayed) Labs Reviewed  BRAIN NATRIURETIC PEPTIDE - Abnormal; Notable for the following:       Result Value   B Natriuretic Peptide 1,175.0 (*)    All other components within normal limits  COMPREHENSIVE METABOLIC PANEL - Abnormal; Notable for the following:    Sodium 133 (*)    Chloride 94 (*)    Glucose, Bld 119 (*)    BUN 21 (*)    Total Protein 6.1 (*)    All other components within normal limits  CBC WITH  DIFFERENTIAL/PLATELET - Abnormal; Notable for the following:    Platelets 144 (*)    Neutro Abs 8.2 (*)    Lymphs Abs 0.5 (*)    Monocytes Absolute 1.2 (*)    All other components within normal limits  TROPONIN I  URINALYSIS, ROUTINE W REFLEX MICROSCOPIC    EKG  EKG Interpretation  Date/Time:  Saturday November 26 2016 16:47:25 EDT Ventricular Rate:  136 PR Interval:    QRS Duration: 92 QT Interval:  302 QTC Calculation: 463 R Axis:   45 Text Interpretation:  Afib/flut and V-paced complexes No further rhythm analysis attempted due to paced rhythm Repolarization abnormality, prob rate related Confirmed by Davonna Belling [phone]) on 11/26/2016 4:58:03 PM       Radiology Dg Chest 2 View  Result Date: 11/26/2016 CLINICAL DATA:  Dyspnea EXAM: CHEST  2 VIEW COMPARISON:  11/04/2016 FINDINGS: Diffuse interstitial coarsening, new. This could represent edema or infection. No confluent alveolar consolidation. No pleural effusions. Unremarkable hilar and mediastinal contours. Borderline heart size, unchanged. IMPRESSION: New diffuse coarsening of the interstitium. This could be due to infection or edema. No confluent consolidation. Electronically Signed   By: Andreas Newport M.D.   On: 11/26/2016 18:27   Ct Angio Abd/pel W And/or Wo Contrast  Result Date: 11/26/2016 CLINICAL DATA:  Generalized weakness. Abdominal pain with radiation to bilateral flanks. Known abdominal aortic aneurysm. EXAM: CTA ABDOMEN AND PELVIS wITHOUT AND WITH CONTRAST TECHNIQUE: Multidetector CT imaging of the abdomen and pelvis was performed using the standard protocol during bolus administration of intravenous contrast. Multiplanar reconstructed images and MIPs were obtained and reviewed to evaluate the vascular anatomy. CONTRAST:  100 cc Isovue 370 intravenously. COMPARISON:  None. FINDINGS: VASCULAR Aorta: Atherosclerotic disease of the aorta with calcified and noncalcified plaque. Ectatic aorta. Infra renal  fusiform abdominal aortic aneurysm measuring 4.6 by 4.2 cm. The aneurysmal sac measures 6.5 cm in craniocaudal direction. Crescent-shaped hypoattenuated area along the left wall of the aorta likely represents an intramural thrombus. There is an associated aneurysmal dilation of the bilateral common iliac artery is, the larger on the left measures 3.2 cm. Celiac: Patent without evidence of aneurysm, dissection, vasculitis or significant stenosis. SMA: Patent without evidence of aneurysm, dissection, vasculitis or significant stenosis. Renals: Both renal arteries are patent without evidence of aneurysm, dissection, vasculitis, fibromuscular dysplasia or significant stenosis. IMA: Patent without evidence of aneurysm, dissection, vasculitis or significant stenosis. Arises  from the anterior portion of the aneurysmal aorta. Inflow: Patent without evidence of aneurysm, dissection, vasculitis or significant stenosis. Proximal Outflow: Bilateral common femoral and visualized portions of the superficial and profunda femoral arteries are patent without evidence of aneurysm, dissection, vasculitis or significant stenosis. Veins: No obvious venous abnormality within the limitations of this arterial phase study. Review of the MIP images confirms the above findings. NON-VASCULAR Lower chest: Soft tissue impaction of the segmental bronchi to the basilar segments of bilateral lower lobes. Persistent right lower lobe posteromedial endobronchial soft tissue with surrounding atelectasis. Hepatobiliary: Indeterminate 7 mm left lower lobe hypoattenuated lesion. Small amount of pericholecystic fluid. Pancreas: Unremarkable. No pancreatic ductal dilatation or surrounding inflammatory changes. Spleen: Normal in size without focal abnormality. Adrenals/Urinary Tract: Adrenal glands are unremarkable. Kidneys are without renal calculi, suspicious focal lesion, or hydronephrosis. Right renal cysts. Mild symmetric mucosal thickening of the  urinary bladder. Stomach/Bowel: Stomach is within normal limits. No evidence of bowel wall thickening, distention, or inflammatory changes. Lymphatic: No evidence of lymphadenopathy. Reproductive: Enlarged prostate gland. Radiation seeds versus surgical clips within the prostate gland. Other: Chest wall edema. Moderate amount of abdominopelvic ascites with water density. Musculoskeletal: Multilevel osteoarthritic changes of the lumbosacral spine. IMPRESSION: VASCULAR Fusiform infrarenal abdominal aortic aneurysm, measuring 4.6 cm in greatest transverse dimension. Crescent-shaped hypoattenuation within the aneurysmal sac along the lateral aortic wall likely represents intramural thrombus. Recommend followup by abdomen and pelvis CTA in 6 months, and vascular surgery referral/consultation if not already obtained. This recommendation follows ACR consensus guidelines: White Paper of the ACR Incidental Findings Committee II on Vascular Findings. J Am Coll Radiol 2013; 10:789-794. Extension of the aneurysmal dilation to the bilateral common iliac arteries, left greater than right, with maximum diameter of 3.2 cm. Atherosclerotic disease of the aorta in its abdominal attributes. The named abdominal arterial branches however are patent. NON-VASCULAR Water density abdominopelvic ascites and chest wall edema. Pericholecystic fluid. It is difficult to determine whether this represent inflammatory changes associated with the gallbladder or is due to presence of ascites. Indeterminate 7 mm hypoattenuated nodule in the left lobe of the liver. Enlargement of the prostate gland with mild mucosal thickening of the urinary bladder, which may be due to ongoing outlet obstruction. Persistent endobronchial soft tissue versus mucous obstruction in bilateral lower lobes with soft tissue peribronchial density in the medial posterior aspect of the right lower lobe. This finding may represent area of atelectasis, due to mucous plugging,  however pulmonary malignancy with associated endobronchial lesion cannot be excluded. Electronically Signed   By: Fidela Salisbury M.D.   On: 11/26/2016 20:15    Procedures Procedures (including critical care time)  Medications Ordered in ED Medications  diltiazem (CARDIZEM) 1 mg/mL load via infusion 5 mg (5 mg Intravenous Bolus from Bag 11/26/16 1946)    And  diltiazem (CARDIZEM) 100 mg in dextrose 5% 15mL (1 mg/mL) infusion (12.5 mg/hr Intravenous Rate/Dose Change 11/26/16 2100)  ipratropium-albuterol (DUONEB) 0.5-2.5 (3) MG/3ML nebulizer solution 3 mL (3 mLs Nebulization Given 11/26/16 1725)  furosemide (LASIX) injection 40 mg (40 mg Intravenous Given 11/26/16 1947)  iopamidol (ISOVUE-370) 76 % injection 100 mL (100 mLs Intravenous Contrast Given 11/26/16 1914)     Initial Impression / Assessment and Plan / ED Course  I have reviewed the triage vital signs and the nursing notes.  Pertinent labs & imaging results that were available during my care of the patient were reviewed by me and considered in my medical decision making (see chart for details).  Patient presented with swelling and some shortness of breath and abdominal pain. History of A. Fib and is on chronic anticoagulation.weight is up 10 pounds. Also has CHF on x-ray.IV Lasix given and IV Cardizem started. CT angiography done of abdomen. Shows AAA and iliac aneurysms. Discussed with Dr. Donzetta Matters from vascular surgery. Can be followed up as an outpatient does not need acute intervention. Will admit to hospitalist.  CRITICAL CARE Performed by: Mackie Pai Total critical care time: 30 minutes Critical care time was exclusive of separately billable procedures and treating other patients. Critical care was necessary to treat or prevent imminent or life-threatening deterioration. Critical care was time spent personally by me on the following activities: development of treatment plan with patient and/or surrogate as  well as nursing, discussions with consultants, evaluation of patient's response to treatment, examination of patient, obtaining history from patient or surrogate, ordering and performing treatments and interventions, ordering and review of laboratory studies, ordering and review of radiographic studies, pulse oximetry and re-evaluation of patient's condition.   Final Clinical Impressions(s) / ED Diagnoses   Final diagnoses:  Atrial fibrillation with rapid ventricular response (HCC)  Congestive heart failure, unspecified HF chronicity, unspecified heart failure type ([place])  Abdominal aortic aneurysm (AAA) without rupture [facility])    New Prescriptions New Prescriptions   No medications on file     Davonna Belling, MD 11/26/16 2103

## 2016-11-26 NOTE — H&P (Addendum)
History and Physical    Cody George:[id number] DOB: 19-Jun-1930 DOA: 11/26/2016  PCP: Celene Squibb, MD   Patient coming from: Home  Chief Complaint: Swelling in legs, wt gain  HPI: Cody George is a 83 y.o. male with medical history significant for atrial fibrillation on Eliquis, chronic systolic CHF, hypertension, AAA, and prostate cancer treated with radiation, and into the emergency department for evaluation of swelling in the legs and abdomen with 10 pound weight gain over the past week. Patient reports a history of chronic bilateral leg swelling, always worse in the right, but states that this has worsened significantly over the past week. He reports a chronic unchanged cough. He also feels that he is having swelling in his abdomen, and also notes abdominal pain that has been chronic.He was recently noted to have a AAA and iliac aneurysms incidentally on a CT and has vascular surgery follow-up scheduled.  ED Course: Upon arrival to the ED, patient is found to be afebrile, saturating adequately on room air, tachypneic in the 30s, tachycardic to the 150s, and with stable blood pressure. EKG features atrial fibrillation/flutter with rate 136. Chest x-ray is notable for new diffuse coarsening of the interstitium, likely representing edema. Chemistry panels notable for sodium of 133 and CBC features a slight thermos cytopenia with platelets 144,000. Urinalysis is unremarkable, troponin is undetectable, and BNP is elevated to 1175. CTA abdomen demonstrates a fusiform infrarenal AAA and iliac aneurysms with recommended follow-up scan in 6 months. Patient was started on diltiazem infusion, given 40 mg IV Lasix, and treated with DuoNeb in the ED. Vascular surgery consulted by the ED physician, review of the case, and recommended that the patient follow-up with vascular surgery as an outpatient. Heart rate has improved with diltiazem infusion and the patient will be admitted to the stepdown unit  for ongoing evaluation and management of acute on chronic systolic CHF and atrial fibrillation with RVR.  Review of Systems:  All other systems reviewed and apart from HPI, are negative.  Past Medical History:  Diagnosis Date  . Acute systolic congestive heart failure ([place]) 12/07/2015  . Arthritis   . Cellulitis   . Community acquired pneumonia 04/14/2014  . Deep vein thrombosis (DVT) of left upper extremity ([place]) 04/13/2016  . Depression 01/06/2016  . Dysrhythmia    PAF  . GERD (gastroesophageal reflux disease)   . History of nuclear stress test 10/05/2010   dipyridamole; normal pattern of perfusion in all regions; no significant ishcemia, low risk scan   . Hypertension   . OA (osteoarthritis)   . Pacemaker 07/22/2011   St. 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 Paroxysmal atrial fibrillation ([facility])   . Prostate cancer ([place])    radiation  . Shortness of breath     Past Surgical History:  Procedure Laterality Date  . CARDIOVERSION N/A 12/07/2015   Procedure: CARDIOVERSION;  Surgeon: Pixie Casino, MD;  Location: [facility] ENDOSCOPY;  Service: Cardiovascular;  Laterality: N/A;  . HERNIA REPAIR Bilateral   . INGUINAL HERNIA REPAIR Left 06/10/2015   Procedure: HERNIA REPAIR INGUINAL ADULT WITH MESH;  Surgeon: Aviva Signs, MD;  Location: [facility] ORS;  Service: General;  Laterality: Left;  Pt notified to arrive at 6:15am KF  . NM MYOCAR PERF WALL MOTION  10/05/10   normal  . PACEMAKER INSERTION  07/22/2011   St. 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 PERMANENT PACEMAKER INSERTION  N/A 07/22/2011   Procedure: PERMANENT PACEMAKER INSERTION;  Surgeon: Sanda Klein, MD;  Location: [place] CATH LAB;  Service: Cardiovascular;  Laterality: N/A;  . PILONIDAL CYST DRAINAGE     x2  . TEE WITHOUT CARDIOVERSION N/A 12/07/2015   Procedure: TRANSESOPHAGEAL ECHOCARDIOGRAM (TEE);  Surgeon: Pixie Casino, MD;  Location: [facility]  ENDOSCOPY;  Service: Cardiovascular;  Laterality: N/A;  . TRANSTHORACIC ECHOCARDIOGRAM  10/05/2010   EF=50-55%, normal LV systolic function; LA & RA mildly dilated; mild MR; mild TR with RV systolic pressure elevted at 30-18mmHg; AV mildly sclerotic with mild calcif of AV leaflets, mild valvular AS, mild-mod regurg; trace pulm valve regurg  . US ECHOCARDIOGRAPHY  10/05/10   LA mildly dilated, mild TR, mild ca+ of AOV ,mild to mod. AI     reports that he has been smoking.  He has a 32.50 pack-year smoking history. He has never used smokeless tobacco. He reports that he does not drink alcohol or use drugs.  No Known Allergies  Family History  Problem Relation Age of Onset  . Sudden Cardiac Death Neg Hx      Prior to Admission medications   Medication Sig Start Date End Date Taking? Authorizing Provider  ELIQUIS 5 MG TABS tablet TAKE ONE TABLET TWICE DAILY 10/03/16  Yes Croitoru, Mihai, MD  furosemide (LASIX) 40 MG tablet TAKE ONE (1) TABLET EACH DAY 11/07/16  Yes Hilty, Nadean Corwin, MD  HYDROcodone-acetaminophen (NORCO/VICODIN) 5-325 MG tablet One tablet every four hours as needed for acute pain.  Limit of five days per [place]. 10/18/16  Yes Sanjuana Kava, MD  mirtazapine (REMERON) 30 MG tablet Take 30 mg by mouth at bedtime.  11/15/16  Yes [provider]  tamsulosin (FLOMAX) 0.4 MG CAPS capsule Take 0.4 mg by mouth every evening.    Yes [provider]  vitamin B-12 (CYANOCOBALAMIN) 1000 MCG tablet Take 1,000 mcg by mouth daily.   Yes [provider]  VITAMIN C, CALCIUM ASCORBATE, PO Take 1,000 mg by mouth daily.    Yes [provider]  VITAMIN D, CHOLECALCIFEROL, PO Take 1,000 mg by mouth daily.    Yes [provider]  metoprolol succinate (TOPROL-XL) 50 MG 24 hr tablet Take 1 tablet (50 mg total) by mouth daily. 04/15/16   Croitoru, [name], MD    Physical Exam: Vitals:   11/26/16 2110 11/26/16 2150 11/26/16 2155 11/26/16 2200  BP: 113/85  97/71 100/75 109/77  Pulse: (!) 120 68 (!) 125 (!) 111  Resp: (!) 23     Temp:      TempSrc:      SpO2: 94% 90% 90% 96%  Weight:      Height:          Constitutional: NAD, calm, in apparent discomfort Eyes: PERTLA, lids and conjunctivae normal ENMT: Mucous membranes are moist. Posterior pharynx clear of any exudate or lesions.   Neck: normal, supple, no masses, no thyromegaly Respiratory: clear to auscultation bilaterally, no wheezing, no crackles. Normal respiratory effort.    Cardiovascular: Rate ~120 and irregular. Bilateral leg swelling into hips. Neck vein distension. Abdomen: No distension, no tenderness, soft. Bowel sounds active.  Musculoskeletal: no clubbing / cyanosis. No joint deformity upper and lower extremities.   Skin: no significant rashes, lesions, ulcers. Warm, dry, well-perfused. Neurologic: CN 2-12 grossly intact. Sensation intact. Strength 5/5 in all 4 limbs.  Psychiatric: Alert and oriented x 3. Normal mood and affect.     Labs on Admission: I have personally reviewed following  labs and imaging studies  CBC:  Recent Labs Lab 11/26/16 1659  WBC 9.9  NEUTROABS 8.2*  HGB 14.2  HCT 41.8  MCV 95.7  PLT 119*   Basic Metabolic Panel:  Recent Labs Lab 11/26/16 1659  NA 133*  K 4.3  CL 94*  CO2 30  GLUCOSE 119*  BUN 21*  CREATININE 0.79  CALCIUM 9.2   GFR: Estimated Creatinine Clearance: 66.5 mL/min (by C-G formula based on SCr of 0.79 mg/dL). Liver Function Tests:  Recent Labs Lab 11/26/16 1659  AST 24  ALT 26  ALKPHOS 59  BILITOT 1.2  PROT 6.1*  ALBUMIN 4.0   No results for input(s): LIPASE, AMYLASE in the last 168 hours. No results for input(s): AMMONIA in the last 168 hours. Coagulation Profile: No results for input(s): INR, PROTIME in the last 168 hours. Cardiac Enzymes:  Recent Labs Lab 11/26/16 1659  TROPONINI <0.03   BNP (last 3 results) No results for input(s): PROBNP in the last 8760 hours. HbA1C: No results for  input(s): HGBA1C in the last 72 hours. CBG: No results for input(s): GLUCAP in the last 168 hours. Lipid Profile: No results for input(s): CHOL, HDL, LDLCALC, TRIG, CHOLHDL, LDLDIRECT in the last 72 hours. Thyroid Function Tests: No results for input(s): TSH, T4TOTAL, FREET4, T3FREE, THYROIDAB in the last 72 hours. Anemia Panel: No results for input(s): VITAMINB12, FOLATE, FERRITIN, TIBC, IRON, RETICCTPCT in the last 72 hours. Urine analysis:    Component Value Date/Time   COLORURINE YELLOW 11/26/2016 1706   APPEARANCEUR CLEAR 11/26/2016 1706   LABSPEC 1.010 11/26/2016 1706   PHURINE 7.0 11/26/2016 1706   GLUCOSEU NEGATIVE 11/26/2016 1706   HGBUR NEGATIVE 11/26/2016 1706   BILIRUBINUR NEGATIVE 11/26/2016 1706   KETONESUR NEGATIVE 11/26/2016 1706   PROTEINUR NEGATIVE 11/26/2016 1706   UROBILINOGEN 1.0 04/14/2014 1645   NITRITE NEGATIVE 11/26/2016 1706   LEUKOCYTESUR NEGATIVE 11/26/2016 1706   Sepsis Labs: @LABRCNTIP (procalcitonin:4,lacticidven:4) )No results found for this or any previous visit (from the past 240 hour(s)).   Radiological Exams on Admission: Dg Chest 2 View  Result Date: 11/26/2016 CLINICAL DATA:  Dyspnea EXAM: CHEST  2 VIEW COMPARISON:  11/04/2016 FINDINGS: Diffuse interstitial coarsening, new. This could represent edema or infection. No confluent alveolar consolidation. No pleural effusions. Unremarkable hilar and mediastinal contours. Borderline heart size, unchanged. IMPRESSION: New diffuse coarsening of the interstitium. This could be due to infection or edema. No confluent consolidation. Electronically Signed   By: Andreas Newport M.D.   On: 11/26/2016 18:27   Ct Angio Abd/pel W And/or Wo Contrast  Result Date: 11/26/2016 CLINICAL DATA:  Generalized weakness. Abdominal pain with radiation to bilateral flanks. Known abdominal aortic aneurysm. EXAM: CTA ABDOMEN AND PELVIS wITHOUT AND WITH CONTRAST TECHNIQUE: Multidetector CT imaging of the abdomen and pelvis  was performed using the standard protocol during bolus administration of intravenous contrast. Multiplanar reconstructed images and MIPs were obtained and reviewed to evaluate the vascular anatomy. CONTRAST:  100 cc Isovue 370 intravenously. COMPARISON:  None. FINDINGS: VASCULAR Aorta: Atherosclerotic disease of the aorta with calcified and noncalcified plaque. Ectatic aorta. Infra renal fusiform abdominal aortic aneurysm measuring 4.6 by 4.2 cm. The aneurysmal sac measures 6.5 cm in craniocaudal direction. Crescent-shaped hypoattenuated area along the left wall of the aorta likely represents an intramural thrombus. There is an associated aneurysmal dilation of the bilateral common iliac artery is, the larger on the left measures 3.2 cm. Celiac: Patent without evidence of aneurysm, dissection, vasculitis or significant stenosis. SMA: Patent without evidence  of aneurysm, dissection, vasculitis or significant stenosis. Renals: Both renal arteries are patent without evidence of aneurysm, dissection, vasculitis, fibromuscular dysplasia or significant stenosis. IMA: Patent without evidence of aneurysm, dissection, vasculitis or significant stenosis. Arises from the anterior portion of the aneurysmal aorta. Inflow: Patent without evidence of aneurysm, dissection, vasculitis or significant stenosis. Proximal Outflow: Bilateral common femoral and visualized portions of the superficial and profunda femoral arteries are patent without evidence of aneurysm, dissection, vasculitis or significant stenosis. Veins: No obvious venous abnormality within the limitations of this arterial phase study. Review of the MIP images confirms the above findings. NON-VASCULAR Lower chest: Soft tissue impaction of the segmental bronchi to the basilar segments of bilateral lower lobes. Persistent right lower lobe posteromedial endobronchial soft tissue with surrounding atelectasis. Hepatobiliary: Indeterminate 7 mm left lower lobe hypoattenuated  lesion. Small amount of pericholecystic fluid. Pancreas: Unremarkable. No pancreatic ductal dilatation or surrounding inflammatory changes. Spleen: Normal in size without focal abnormality. Adrenals/Urinary Tract: Adrenal glands are unremarkable. Kidneys are without renal calculi, suspicious focal lesion, or hydronephrosis. Right renal cysts. Mild symmetric mucosal thickening of the urinary bladder. Stomach/Bowel: Stomach is within normal limits. No evidence of bowel wall thickening, distention, or inflammatory changes. Lymphatic: No evidence of lymphadenopathy. Reproductive: Enlarged prostate gland. Radiation seeds versus surgical clips within the prostate gland. Other: Chest wall edema. Moderate amount of abdominopelvic ascites with water density. Musculoskeletal: Multilevel osteoarthritic changes of the lumbosacral spine. IMPRESSION: VASCULAR Fusiform infrarenal abdominal aortic aneurysm, measuring 4.6 cm in greatest transverse dimension. Crescent-shaped hypoattenuation within the aneurysmal sac along the lateral aortic wall likely represents intramural thrombus. Recommend followup by abdomen and pelvis CTA in 6 months, and vascular surgery referral/consultation if not already obtained. This recommendation follows ACR consensus guidelines: White Paper of the ACR Incidental Findings Committee II on Vascular Findings. J Am Coll Radiol 2013; 10:789-794. Extension of the aneurysmal dilation to the bilateral common iliac arteries, left greater than right, with maximum diameter of 3.2 cm. Atherosclerotic disease of the aorta in its abdominal attributes. The named abdominal arterial branches however are patent. NON-VASCULAR Water density abdominopelvic ascites and chest wall edema. Pericholecystic fluid. It is difficult to determine whether this represent inflammatory changes associated with the gallbladder or is due to presence of ascites. Indeterminate 7 mm hypoattenuated nodule in the left lobe of the liver.  Enlargement of the prostate gland with mild mucosal thickening of the urinary bladder, which may be due to ongoing outlet obstruction. Persistent endobronchial soft tissue versus mucous obstruction in bilateral lower lobes with soft tissue peribronchial density in the medial posterior aspect of the right lower lobe. This finding may represent area of atelectasis, due to mucous plugging, however pulmonary malignancy with associated endobronchial lesion cannot be excluded. Electronically Signed   By: Fidela Salisbury M.D.   On: 11/26/2016 20:15    EKG: Independently reviewed. Atrial fib/flutter with RVR (rate 136).   Assessment/Plan  1. Acute on chronic systolic CHF  - Pt presents with increased peripheral edema and wt gain, found to be in acute CHF, with rapid a fib likely contributing   - Treated with Lasix 40 mg IV in ED and has begun diuresing  - Plan to continue cardiac monitoring, Lasix 40 mg IV q12h, follow daily wt and strict I/O's, fluid-restrict diet and SLIV, continue beta-blocker, follow daily chem panel during diuresis, and update echocardiogram   2. Atrial fibrillation with RVR  - Pt is atrial fibrillation/flutter on arrival with rate as high as 150's  - CHADS-VASc is at  least 55 (age x2, CHF, CAD)  - Started on diltiazem infusion in ED, will continue with titration for goal HR <110  - Continue Eliquis, continue cardiac monitoring    3. AAA  - CTA done in ED, discussed with vascular surgery and continued outpatient follow-up advised   4. Bronchial lesions - CT is notable for endobronchial lesions vs mucus causing obstruction in bases  - Outpatient follow-up recommended    5. Hyponatremia  - Serum sodium is 133 on admission, chronically low - Likely secondary to CHF  - Will follow daily chem panel during diuresis    DVT prophylaxis: Eliquis Code Status: Full  Family Communication: Discussed with patient Disposition Plan: Admit to SDU Consults called: Vascular surgery  discussed case by phone  Admission status: Inpatient    Vianne Bulls, MD [facility] Pager [phone]  If 7PM-7AM, please contact night-coverage [email or web address] Password [email or web address]  11/26/2016, 10:15 PM

## 2016-11-26 NOTE — ED Triage Notes (Addendum)
Patient c/o generalized abd pain that radiates into flanks bilaterally. Denies any nausea, vomiting, diarrhea, or fevers. Patient reports decrease in urinary flow and swelling in abd and right leg swelling. Patient reports gaining 10lbs in x1 week. Patient reports generalized weakness.

## 2016-11-27 ENCOUNTER — Inpatient Hospital Stay (HOSPITAL_COMMUNITY): Payer: Medicare Other

## 2016-11-27 DIAGNOSIS — L8990 Pressure ulcer of unspecified site, unspecified stage: Secondary | ICD-10-CM | POA: Insufficient documentation

## 2016-11-27 LAB — BASIC METABOLIC PANEL
Anion gap: 10 (ref 5–15)
BUN: 18 mg/dL (ref 6–20)
CHLORIDE: 90 mmol/L — AB (ref 101–111)
CO2: 33 mmol/L — AB (ref 22–32)
CREATININE: 0.69 mg/dL (ref 0.61–1.24)
Calcium: 8.9 mg/dL (ref 8.9–10.3)
GFR calc Af Amer: >60 mL/min (ref >60)
GLUCOSE: 91 mg/dL (ref 65–99)
POTASSIUM: 3.9 mmol/L (ref 3.5–5.1)
SODIUM: 133 mmol/L — AB (ref 135–145)

## 2016-11-27 LAB — MRSA PCR SCREENING: MRSA by PCR: NEGATIVE

## 2016-11-27 MED ORDER — DIGOXIN 0.25 MG/ML IJ SOLN
0.2500 mg | Freq: Once | INTRAMUSCULAR | Status: AC
  Administered 2016-11-27: 0.25 mg via INTRAVENOUS
  Filled 2016-11-27: qty 2

## 2016-11-27 MED ORDER — METOPROLOL SUCCINATE ER 50 MG PO TB24
100.0000 mg | ORAL_TABLET | Freq: Every day | ORAL | Status: DC
  Administered 2016-11-27 – 2016-11-28 (×2): 100 mg via ORAL
  Filled 2016-11-27 (×2): qty 2

## 2016-11-27 MED ORDER — NICOTINE 14 MG/24HR TD PT24
14.0000 mg | MEDICATED_PATCH | Freq: Every day | TRANSDERMAL | Status: DC
  Administered 2016-11-27 – 2016-12-01 (×5): 14 mg via TRANSDERMAL
  Filled 2016-11-27 (×5): qty 1

## 2016-11-27 NOTE — Plan of Care (Signed)
Problem: Safety: Goal: Ability to remain free from injury will improve Outcome: Progressing Side rails up, bed in low position, call light and personal items within reach

## 2016-11-27 NOTE — Plan of Care (Signed)
Problem: Tissue Perfusion: Goal: Risk factors for ineffective tissue perfusion will decrease Outcome: Progressing Pt receiving eliquis for dvt prevention

## 2016-11-27 NOTE — Progress Notes (Signed)
PROGRESS NOTE                                                                                                                                                                                                             Patient Demographics:    Cody George, is a 83 y.o. male, DOB - 1931/02/10, GMW:[id number]  Admit date - 11/26/2016   Admitting Physician Vianne Bulls, MD  Outpatient Primary MD for the patient is Celene Squibb, MD  LOS - 1  Outpatient Specialists: Dr. Debara Pickett Dr. Caryl Comes Dr. Oneida Alar (vascular surgery)  Chief Complaint  Patient presents with  . Abdominal Pain       Brief Narrative  83 year old male with history of paroxysmalA. 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 He was recently diagnosed with have AAA an idea aneurysm incidentally on CT scan and informs being seen by vascular surgeon Dr. Oneida Alar as outpatient. In the ED he was tachypneic to the 30s and in rapid A. Fib with heart rate up to 150s. Chest x-ray showed findings of pulmonary edema. Blood work showed mild hyponatremia and mild cytopenia.BNP elevated to 1175. CT angiogram of the abdomen done for his abdominal distention showed fusiform infrarenal AAA and iliac aneurysms. Vascular surgery consulted by ED physician who recommended no acute intervention and outpatient follow-up. Patient admitted to stepdown unit for A. Fib with RVR and acute on chronic systolic CHF.    Subjective:   Patient feels better since admitted. Heart rate continues to frequently rise up to 130s on the monitor. Denies any abdominal  discomfort.   Assessment  & Plan :    Principal Problem:  Atrial fibrillation with RVR (HCC) On maximum dose Cardizem drip. Heart rate still elevated in the 120s-130s. I will increase his Toprol dose 200 mg daily and if still uncontrolled,I will add digoxin.as per his cardiologist's note patient has been intolerant to amiodarone. Continue anticoagulation. Cardiology consult for evaluation tomorrow.     Acute on chronic systolic CHF (congestive heart failure) (HCC) On IV Lasix 40  mg twice a day. Strict I/O and daily weight. I would increase his metoprolol dose for better heart rate control. 2-D echo once heart rate more stable. I will add lisinopril. Check lipid panel.  Active Problems:      AAA (abdominal aortic aneurysm) (HCC) CT angiogram done in the ED shows infrarenal fusiform abdominal aortic aneurysm measuring 4.6 x 4.2 cm with the aneurysmal sac measuring 6.5 cm. Associated aneurysmal dilatation of bilateral common iliac artery with larger on the left measuring 3.2 cm.recommends follow-up CT in 6 months. Dr. Kasandra Knudsen was consulted by ED physician who recommended outpatient follow-up. Patient reports that he saw Dr. Oneida Alar recently recommended monitoring and outpatient follow-up.    Bronchial obstruction As seen on CT with findings of persistent endobronchial soft tissue versus mucus obstruction in bilateral lower lobes which could be either due to mucus plugging with atelectasis versus endobronchial lesion.patient does report chronic cough. There is also a 7 mm indeterminate nodule in the left lower liver. Patient will need outpatient appointment with pulmonary for possible bronchoscopy.    Hyponatremia Mild. Possibly hypervolemic. Monitor with diuresis.    Pressure injury of skin Care per nursing.      Code Status : full code  Family Communication  : none at bedside  Disposition Plan  : home once improved  Barriers For Discharge : active symptoms  Consults  :   Cardiology (for tomorrow)  Procedures  :  CT angiogram of the abdomen 2-D echo (once heart rate better controlled)  DVT Prophylaxis  :  eliquis  Lab Results  Component Value Date   PLT 144 (L) 11/26/2016    Antibiotics  :   Anti-infectives    None        Objective:   Vitals:   11/27/16 0615 11/27/16 0630 11/27/16 0748 11/27/16 0756  BP: 111/71 95/72    Pulse: (!) 104 (!) 103 (!) 128   Resp: (!) 22 (!) 24 20   Temp:   97.6 F (36.4 C)   TempSrc:   Oral   SpO2: 92% 92% 92% 90%  Weight:      Height:        Wt Readings from Last 3 Encounters:  11/27/16 72.9 kg (160 lb 11.5 oz)  11/23/16 75.8 kg (167 lb)  11/04/16 74.4 kg (164 lb)     Intake/Output Summary (Last 24 hours) at 11/27/16 1035 Last data filed at 11/27/16 0817  Gross per 24 hour  Intake            379.3 ml  Output             1000 ml  Net           -620.7 ml     Physical Exam  Gen: not in distress HEENT: no pallor, moist mucosa, supple neck Chest: scattered rhonchi with diminished breath sounds over bilaterallung base CVS: S1 and S2 irregularly irregular, no murmurs or gallop GI: soft, NT, ND, BS+ Musculoskeletal: warm, 2+ pitting edema bilaterally     Data Review:    CBC  Recent Labs Lab 11/26/16 1659  WBC 9.9  HGB 14.2  HCT 41.8  PLT 144*  MCV 95.7  MCH 32.5  MCHC 34.0  RDW 15.0  LYMPHSABS 0.5*  MONOABS 1.2*  EOSABS 0.0  BASOSABS 0.0    Chemistries   Recent Labs Lab 11/26/16 1659 11/27/16 0454  NA 133* 133*  K 4.3 3.9  CL 94* 90*  CO2 30 33*  GLUCOSE 119* 91  BUN 21* 18  CREATININE 0.79 0.69  CALCIUM 9.2 8.9  AST 24  --   ALT 26  --   ALKPHOS 59  --   BILITOT 1.2  --    ------------------------------------------------------------------------------------------------------------------ No results for input(s): CHOL, HDL, LDLCALC, TRIG, CHOLHDL, LDLDIRECT in the last 72 hours.  No results found for:  HGBA1C ------------------------------------------------------------------------------------------------------------------ No results for input(s): TSH, T4TOTAL, T3FREE, THYROIDAB in the last 72 hours.  Invalid input(s): FREET3 ------------------------------------------------------------------------------------------------------------------ No results for input(s): VITAMINB12, FOLATE, FERRITIN, TIBC, IRON, RETICCTPCT in the last 72 hours.  Coagulation profile No results for input(s): INR, PROTIME in the last 168 hours.  No results for input(s): DDIMER in the last 72 hours.  Cardiac Enzymes  Recent Labs Lab 11/26/16 1659  TROPONINI <0.03   ------------------------------------------------------------------------------------------------------------------    Component Value Date/Time   BNP 1,175.0 (H) 11/26/2016 1659   BNP 392.5 (H) 11/04/2015 1208    Inpatient Medications  Scheduled Meds: . apixaban  5 mg Oral BID  . furosemide  40 mg Intravenous Q12H  . metoprolol succinate  100 mg Oral Daily  . mirtazapine  30 mg Oral QHS  . sodium chloride flush  3 mL Intravenous Q12H  . tamsulosin  0.4 mg Oral QPC supper  . vitamin B-12  1,000 mcg Oral Daily   Continuous Infusions: . sodium chloride    . diltiazem (CARDIZEM) infusion 15 mg/hr (11/27/16 0808)   PRN Meds:.sodium chloride, acetaminophen, ALPRAZolam, HYDROcodone-acetaminophen, ondansetron (ZOFRAN) IV, sodium chloride flush  Micro Results Recent Results (from the past 240 hour(s))  MRSA PCR Screening     Status: None   Collection Time: 11/27/16  2:08 AM  Result Value Ref Range Status   MRSA by PCR NEGATIVE NEGATIVE Final    Comment:        The GeneXpert MRSA Assay (FDA approved for NASAL specimens only), is one component of a comprehensive MRSA colonization surveillance program. It is not intended to diagnose MRSA infection nor to guide or monitor treatment for MRSA infections.     Radiology Reports Dg Chest  2 View  Result Date: 11/26/2016 CLINICAL DATA:  Dyspnea EXAM: CHEST  2 VIEW COMPARISON:  11/04/2016 FINDINGS: Diffuse interstitial coarsening, new. This could represent edema or infection. No confluent alveolar consolidation. No pleural effusions. Unremarkable hilar and mediastinal contours. Borderline heart size, unchanged. IMPRESSION: New diffuse coarsening of the interstitium. This could be due to infection or edema. No confluent consolidation. Electronically Signed   By: Andreas Newport M.D.   On: 11/26/2016 18:27   Ct Angio Chest Pe W And/or Wo Contrast  Result Date: 11/04/2016 CLINICAL DATA:  Shortness of breath since yesterday.  Smoker. EXAM: CT ANGIOGRAPHY CHEST WITH CONTRAST TECHNIQUE: Multidetector CT imaging of the chest was performed using the standard protocol during bolus administration of intravenous contrast. Multiplanar CT image reconstructions and MIPs were obtained to evaluate the vascular anatomy. CONTRAST:  100 cc Isovue 370 COMPARISON:  Portable chest obtained earlier today. Chest CT dated 04/14/2014. FINDINGS: Cardiovascular: Several small, linear filling defects in the right lower lobe pulmonary arteries. These are nonocclusive. Atheromatous arterial calcifications, including the coronary arteries and aorta. Mediastinum/Nodes: No enlarged mediastinal, hilar, or axillary lymph nodes. Thyroid gland, trachea, and esophagus demonstrate no significant findings. Lungs/Pleura: Posteromedial right lower lobe endobronchial occluding soft tissue and atelectasis. The endobronchial soft tissue density is not unchanged. Small left pleural effusion. The lungs are mildly hyperexpanded. Upper Abdomen: Reflux of contrast into the inferior vena cava and hepatic veins. Musculoskeletal: Thoracic and lower cervical spine  degenerative changes. Review of the MIP images confirms the above findings. IMPRESSION: 1. Several small, linear right lower lobe pulmonary arterial filling defects. These are  nonocclusive and are compatible with subacute emboli. No definite acute pulmonary emboli seen. 2. Chronic soft tissue density filling a right lower lobe bronchus with associated atelectasis. The chronicity is compatible with a chronic mucous plug or scarring rather than a neoplastic process. 3. Small left pleural effusion. 4. Mild changes of COPD. 5. Calcified coronary artery and aortic atherosclerosis. Aortic Atherosclerosis (ICD10-I70.0) and Emphysema (ICD10-J43.9). Electronically Signed   By: Claudie Revering M.D.   On: 11/04/2016 19:52   Ct Lumbar Spine Wo Contrast  Result Date: 10/28/2016 CLINICAL DATA:  Chronic right-sided low back pain. Initial encounter. EXAM: CT LUMBAR SPINE WITHOUT CONTRAST TECHNIQUE: Multidetector CT imaging of the lumbar spine was performed without intravenous contrast administration. Multiplanar CT image reconstructions were also generated. COMPARISON:  None. FINDINGS: Segmentation: Standard Alignment: Dextroscoliosis centered at L2. Vertebrae: No acute fracture or focal pathologic process. Chronic appearing L2 superior endplate deformity. Paraspinal and other soft tissues: No inflammatory findings. Fusiform aneurysmal enlargement of the abdominal aorta measuring up to 47 mm in diameter. Both common iliac arteries are also aneurysmal, 3 cm on the left and at least 2.1 cm on the right. Partially visualized opacity in the right posterior costophrenic sulcus, chronic based on May 2018 chest CT. Disc levels: T12- L1: Mild facet spurring.  No evidence of impingement L1-L2: Advanced disc degeneration with diffuse vacuum phenomenon. There is worse disc narrowing towards the left. Mild disc bulging and facet spurring. Mild to moderate left foraminal narrowing. L2-L3: Advanced degenerative disc narrowing with vacuum phenomenon. Left preferential endplate spurring and disc bulging. Mild facet spurring. Mild left foraminal narrowing. No visible impingement. L3-L4: Advanced degenerative disc  narrowing with endplate sclerosis and spurring. There is disc bulging with a down turning protrusion centrally, partially containing gas. Facet arthropathy with slight retrolisthesis. Moderate spinal stenosis. Probable bilateral subarticular recess impingement. Bilateral foraminal narrowing without visible L3 compression. L4-L5: Advanced degenerative disc narrowing with endplate sclerosis and spurring preferentially towards the right. Mild degenerative facet spurring. Right foraminal L4 impingement due to disc height loss and endplate spur. Mild, noncompressive left foraminal narrowing. Mild bilateral subarticular recess stenosis. L5-S1:Usual degenerative changes for age. No evidence of impingement. IMPRESSION: 1. No acute finding. 2. Advanced lumbar disc degeneration at L1-2 to L4-5 with scoliosis. 3. L3-4 moderate spinal stenosis. Either L4 nerve root could be affected in the subarticular recesses. 4. L4-5 right foraminal L4 impingement due to disc degeneration. Mild bilateral subarticular recess narrowing. 5. Fusiform aneurysmal enlargement of the abdominal aorta, at least 4.7 cm. Both common iliac arteries are aneurysmal, up to 3 cm on the left. If appropriate for comorbidities, recommend vascular surgery referral. Electronically Signed   By: Monte Fantasia M.D.   On: 10/28/2016 14:11   Dg Chest Portable 1 View  Result Date: 11/04/2016 CLINICAL DATA:  Shortness of breath. EXAM: PORTABLE CHEST 1 VIEW COMPARISON:  Chest x-ray dated November 04, 2015. FINDINGS: The cardiomediastinal silhouette remains mildly enlarged. Unchanged left chest wall AICD. Atherosclerotic calcification of the aortic arch. Mild pulmonary vascular congestion. No pleural effusion or pneumothorax. Bibasilar atelectasis. No acute osseous abnormality. IMPRESSION: Cardiomegaly and mild pulmonary vascular congestion without overt pulmonary edema. Electronically Signed   By: Titus Dubin M.D.   On: 11/04/2016 17:51   Ct Angio Abd/pel W  And/or Wo Contrast  Result Date: 11/26/2016 CLINICAL DATA:  Generalized weakness. Abdominal pain with radiation  to bilateral flanks. Known abdominal aortic aneurysm. EXAM: CTA ABDOMEN AND PELVIS wITHOUT AND WITH CONTRAST TECHNIQUE: Multidetector CT imaging of the abdomen and pelvis was performed using the standard protocol during bolus administration of intravenous contrast. Multiplanar reconstructed images and MIPs were obtained and reviewed to evaluate the vascular anatomy. CONTRAST:  100 cc Isovue 370 intravenously. COMPARISON:  None. FINDINGS: VASCULAR Aorta: Atherosclerotic disease of the aorta with calcified and noncalcified plaque. Ectatic aorta. Infra renal fusiform abdominal aortic aneurysm measuring 4.6 by 4.2 cm. The aneurysmal sac measures 6.5 cm in craniocaudal direction. Crescent-shaped hypoattenuated area along the left wall of the aorta likely represents an intramural thrombus. There is an associated aneurysmal dilation of the bilateral common iliac artery is, the larger on the left measures 3.2 cm. Celiac: Patent without evidence of aneurysm, dissection, vasculitis or significant stenosis. SMA: Patent without evidence of aneurysm, dissection, vasculitis or significant stenosis. Renals: Both renal arteries are patent without evidence of aneurysm, dissection, vasculitis, fibromuscular dysplasia or significant stenosis. IMA: Patent without evidence of aneurysm, dissection, vasculitis or significant stenosis. Arises from the anterior portion of the aneurysmal aorta. Inflow: Patent without evidence of aneurysm, dissection, vasculitis or significant stenosis. Proximal Outflow: Bilateral common femoral and visualized portions of the superficial and profunda femoral arteries are patent without evidence of aneurysm, dissection, vasculitis or significant stenosis. Veins: No obvious venous abnormality within the limitations of this arterial phase study. Review of the MIP images confirms the above findings.  NON-VASCULAR Lower chest: Soft tissue impaction of the segmental bronchi to the basilar segments of bilateral lower lobes. Persistent right lower lobe posteromedial endobronchial soft tissue with surrounding atelectasis. Hepatobiliary: Indeterminate 7 mm left lower lobe hypoattenuated lesion. Small amount of pericholecystic fluid. Pancreas: Unremarkable. No pancreatic ductal dilatation or surrounding inflammatory changes. Spleen: Normal in size without focal abnormality. Adrenals/Urinary Tract: Adrenal glands are unremarkable. Kidneys are without renal calculi, suspicious focal lesion, or hydronephrosis. Right renal cysts. Mild symmetric mucosal thickening of the urinary bladder. Stomach/Bowel: Stomach is within normal limits. No evidence of bowel wall thickening, distention, or inflammatory changes. Lymphatic: No evidence of lymphadenopathy. Reproductive: Enlarged prostate gland. Radiation seeds versus surgical clips within the prostate gland. Other: Chest wall edema. Moderate amount of abdominopelvic ascites with water density. Musculoskeletal: Multilevel osteoarthritic changes of the lumbosacral spine. IMPRESSION: VASCULAR Fusiform infrarenal abdominal aortic aneurysm, measuring 4.6 cm in greatest transverse dimension. Crescent-shaped hypoattenuation within the aneurysmal sac along the lateral aortic wall likely represents intramural thrombus. Recommend followup by abdomen and pelvis CTA in 6 months, and vascular surgery referral/consultation if not already obtained. This recommendation follows ACR consensus guidelines: White Paper of the ACR Incidental Findings Committee II on Vascular Findings. J Am Coll Radiol 2013; 10:789-794. Extension of the aneurysmal dilation to the bilateral common iliac arteries, left greater than right, with maximum diameter of 3.2 cm. Atherosclerotic disease of the aorta in its abdominal attributes. The named abdominal arterial branches however are patent. NON-VASCULAR Water density  abdominopelvic ascites and chest wall edema. Pericholecystic fluid. It is difficult to determine whether this represent inflammatory changes associated with the gallbladder or is due to presence of ascites. Indeterminate 7 mm hypoattenuated nodule in the left lobe of the liver. Enlargement of the prostate gland with mild mucosal thickening of the urinary bladder, which may be due to ongoing outlet obstruction. Persistent endobronchial soft tissue versus mucous obstruction in bilateral lower lobes with soft tissue peribronchial density in the medial posterior aspect of the right lower lobe. This finding may represent area of atelectasis,  due to mucous plugging, however pulmonary malignancy with associated endobronchial lesion cannot be excluded. Electronically Signed   By: Fidela Salisbury M.D.   On: 11/26/2016 20:15    Time Spent in minutes  35   Louellen Molder M.D on 11/27/2016 at 10:35 AM  Between 7am to 7pm - Pager - [phone]  After 7pm go to [email or web address] - password [facility]  [facility] -  Office  [phone]

## 2016-11-28 ENCOUNTER — Telehealth: Payer: Self-pay | Admitting: Vascular Surgery

## 2016-11-28 ENCOUNTER — Inpatient Hospital Stay (HOSPITAL_COMMUNITY): Payer: Medicare Other

## 2016-11-28 DIAGNOSIS — I5023 Acute on chronic systolic (congestive) heart failure: Secondary | ICD-10-CM

## 2016-11-28 DIAGNOSIS — J9809 Other diseases of bronchus, not elsewhere classified: Secondary | ICD-10-CM

## 2016-11-28 DIAGNOSIS — I4891 Unspecified atrial fibrillation: Secondary | ICD-10-CM

## 2016-11-28 DIAGNOSIS — I340 Nonrheumatic mitral (valve) insufficiency: Secondary | ICD-10-CM

## 2016-11-28 LAB — BASIC METABOLIC PANEL
Anion gap: 8 (ref 5–15)
BUN: 18 mg/dL (ref 6–20)
CALCIUM: 8.8 mg/dL — AB (ref 8.9–10.3)
CO2: 37 mmol/L — AB (ref 22–32)
CREATININE: 0.91 mg/dL (ref 0.61–1.24)
Chloride: 90 mmol/L — ABNORMAL LOW (ref 101–111)
GFR calc non Af Amer: >60 mL/min (ref >60)
Glucose, Bld: 90 mg/dL (ref 65–99)
Potassium: 4.6 mmol/L (ref 3.5–5.1)
SODIUM: 135 mmol/L (ref 135–145)

## 2016-11-28 LAB — ECHOCARDIOGRAM COMPLETE
HEIGHTINCHES: 69 in
WEIGHTICAEL: 2486.79 [oz_av]

## 2016-11-28 MED ORDER — METOPROLOL SUCCINATE ER 50 MG PO TB24
50.0000 mg | ORAL_TABLET | Freq: Every evening | ORAL | Status: DC
  Administered 2016-11-28: 50 mg via ORAL
  Filled 2016-11-28: qty 1

## 2016-11-28 MED ORDER — METOPROLOL TARTRATE 50 MG PO TABS: 50.0000 mg | ORAL_TABLET | Freq: Every day | ORAL | Status: DC

## 2016-11-28 MED ORDER — DIGOXIN 125 MCG PO TABS
0.2500 mg | ORAL_TABLET | Freq: Every day | ORAL | Status: DC
  Administered 2016-11-28 – 2016-12-01 (×4): 0.25 mg via ORAL
  Filled 2016-11-28 (×4): qty 2

## 2016-11-28 NOTE — Progress Notes (Signed)
*  PRELIMINARY RESULTS* Echocardiogram 2D Echocardiogram has been performed.  Leavy Cella 11/28/2016, 3:39 PM

## 2016-11-28 NOTE — Progress Notes (Signed)
PROGRESS NOTE                                                                                                                                                                                                             Patient Demographics:    Cody George, is a 83 y.o. male, DOB - [birth year], FVC:[id number]  Admit date - 11/26/2016   Admitting Physician Vianne Bulls, MD  Outpatient Primary MD for the patient is Celene Squibb, MD  LOS - 2  Outpatient Specialists: Dr. Debara Pickett Dr. Caryl Comes Dr. Oneida Alar (vascular surgery)  Chief Complaint  Patient presents with  . Abdominal Pain       Brief Narrative  83 year old male with history of paroxysmalA. 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 He was recently diagnosed with have AAA an idea aneurysm incidentally on CT scan and informs being seen by vascular surgeon Dr. Oneida Alar as outpatient. In the ED he was tachypneic to the 30s and in rapid A. Fib with heart rate up to 150s. Chest x-ray showed findings of pulmonary edema. Blood work showed mild hyponatremia and mild cytopenia.BNP elevated to 1175. CT angiogram of the abdomen done for his abdominal distention showed fusiform infrarenal AAA and iliac aneurysms. Vascular surgery consulted by ED physician who recommended no acute intervention and outpatient follow-up. Patient admitted to stepdown unit for A. Fib with RVR and acute on chronic systolic CHF.    Subjective:   Heart rate still in 110s-120s. Patient reports his breathing to be better.   Assessment  & Plan :    Principal Problem:  Atrial fibrillation with RVR (HCC) On maximum dose Cardizem drip. Heart rate somewhat improved after adding digoxin yesterday but again in 110s-120s. Cardiology consult appreciated and recommend to increase Toprol dose (100 mg am + 50 mg p.m.) and wean Cardizem. -recommend to continue digoxin. As per his cardiologist's note patient has been intolerant to amiodarone. Continue anticoagulation.      Acute on chronic systolic CHF (congestive heart failure) (HCC) Continue IV Lasix. Diuresing well.  Strict I/O and daily weight. I will add lisinopril. Check lipid panel.  Active Problems:      AAA (abdominal aortic aneurysm) (HCC) CT angiogram done in the ED shows infrarenal fusiform abdominal aortic aneurysm measuring 4.6 x 4.2 cm with the aneurysmal sac measuring 6.5 cm. Associated aneurysmal dilatation of bilateral common iliac artery with larger on the left measuring 3.2 cm.recommends follow-up CT in 6 months. Dr. Kasandra Knudsen was consulted by ED physician who recommended outpatient follow-up. Patient reports that he saw Dr. Oneida Alar recently recommended monitoring and outpatient follow-up.    Bronchial obstruction As seen on CT with findings of persistent endobronchial soft tissue versus mucus obstruction in bilateral lower lobes which could be either due to mucus plugging with atelectasis versus endobronchial lesion.patient does report chronic cough. There is also a 7 mm indeterminate nodule in the left lower liver.  Patient will need outpatient appointment with pulmonary for possible bronchoscopy. Patient currently is not hemodynamically stable to get an inpatient procedure.    Hyponatremia Mild. Possibly hypervolemic. Monitor with diuresis.    Pressure injury of skin Care per nursing.      Code Status : full code  Family Communication  : none at bedside  Disposition Plan  : home once improved  Barriers For Discharge : active symptoms  Consults  :  Cardiology   Procedures  :  CT  angiogram of the abdomen 2-D echo (once heart rate better controlled)  DVT Prophylaxis  :  eliquis  Lab Results  Component Value Date   PLT 144 (L) 11/26/2016    Antibiotics  :   Anti-infectives    None        Objective:   Vitals:   11/28/16 0600 11/28/16 0615 11/28/16 0630 11/28/16 0800  BP: 111/85 99/76    Pulse: (!) 103 96 (!) 104   Resp:      Temp:    98.2 F (36.8 C)  TempSrc:    Axillary  SpO2: 97% 98% 97%   Weight:      Height:        Wt Readings from Last 3 Encounters:  11/28/16 70.5 kg (155 lb 6.8 oz)  11/23/16 75.8 kg (167 lb)  11/04/16 74.4 kg (164 lb)     Intake/Output Summary (Last 24 hours) at 11/28/16 0950 Last data filed at 11/28/16 0843  Gross per 24 hour  Intake              570 ml  Output             4375 ml  Net            -3805 ml     Physical Exam Gen.: Elderly male not in distress, hard of hearing HEENT: Moist mucosa, supple neck Chest: There is breath sounds over bilateral lung base with few scattered rhonchi CVS: S1 and S2 irregularly irregular, no murmurs rubs or gallop GI: Soft, nondistended, nontender Musculoskeletal: Warm, 1+ pitting edema bilaterally     Data Review:    CBC  Recent Labs Lab 11/26/16 1659  WBC 9.9  HGB 14.2  HCT 41.8  PLT 144*  MCV 95.7  MCH 32.5  MCHC 34.0  RDW 15.0  LYMPHSABS 0.5*  MONOABS 1.2*  EOSABS 0.0  BASOSABS 0.0    Chemistries   Recent Labs Lab 11/26/16 1659 11/27/16 0454 11/28/16 0442  NA 133* 133* 135  K 4.3 3.9 4.6  CL 94* 90* 90*  CO2 30 33* 37*  GLUCOSE 119* 91 90  BUN 21* 18 18  CREATININE 0.79 0.69 0.91  CALCIUM 9.2 8.9 8.8*  AST 24  --   --   ALT 26  --   --   ALKPHOS 59  --   --   BILITOT 1.2  --   --    ------------------------------------------------------------------------------------------------------------------ No results for input(s): CHOL, HDL, LDLCALC, TRIG, CHOLHDL, LDLDIRECT in the last 72 hours.  No results found for:  HGBA1C ------------------------------------------------------------------------------------------------------------------ No results for input(s): TSH, T4TOTAL, T3FREE, THYROIDAB in the last 72 hours.  Invalid input(s): FREET3 ------------------------------------------------------------------------------------------------------------------ No results for input(s): VITAMINB12, FOLATE, FERRITIN, TIBC, IRON, RETICCTPCT in the last 72 hours.  Coagulation profile No results for input(s): INR, PROTIME in the last 168 hours.  No results for input(s): DDIMER in the last 72 hours.  Cardiac Enzymes  Recent Labs Lab 11/26/16 1659  TROPONINI <0.03   ------------------------------------------------------------------------------------------------------------------    Component Value Date/Time   BNP 1,175.0 (H) 11/26/2016 1659   BNP 392.5 (H) 11/04/2015 1208    Inpatient Medications  Scheduled Meds: . apixaban  5 mg Oral BID  . digoxin  0.25 mg Oral Daily  . furosemide  40 mg Intravenous Q12H  . metoprolol succinate  100 mg Oral Daily  . metoprolol succinate  50 mg Oral QPM  . mirtazapine  30 mg Oral QHS  . nicotine  14 mg Transdermal Daily  . sodium chloride flush  3 mL Intravenous Q12H  . tamsulosin  0.4 mg Oral QPC supper  . vitamin B-12  1,000 mcg Oral Daily   Continuous Infusions: . sodium chloride    . diltiazem (CARDIZEM) infusion 15 mg/hr (11/27/16 2102)   PRN Meds:.sodium chloride, acetaminophen, ALPRAZolam, HYDROcodone-acetaminophen, ondansetron (ZOFRAN) IV, sodium chloride flush  Micro Results Recent Results (from the past 240 hour(s))  MRSA PCR Screening     Status: None   Collection Time: 11/27/16  2:08 AM  Result Value Ref Range Status   MRSA by PCR NEGATIVE NEGATIVE Final    Comment:        The GeneXpert MRSA Assay (FDA approved for NASAL specimens only), is one component of a comprehensive MRSA colonization surveillance program. It is not intended to  diagnose MRSA infection nor to guide or monitor treatment for MRSA infections.     Radiology Reports Dg Chest 2 View  Result Date: 11/26/2016 CLINICAL DATA:  Dyspnea EXAM: CHEST  2 VIEW COMPARISON:  11/04/2016 FINDINGS: Diffuse interstitial coarsening, new. This could represent edema or infection. No confluent alveolar consolidation. No pleural effusions. Unremarkable hilar and mediastinal contours. Borderline heart size, unchanged. IMPRESSION: New diffuse coarsening of the interstitium. This could be due to infection or edema. No confluent consolidation. Electronically Signed   By: Andreas Newport M.D.   On: 11/26/2016 18:27   Ct Angio Chest Pe W And/or Wo Contrast  Result Date: 11/04/2016 CLINICAL DATA:  Shortness of breath since yesterday.  Smoker. EXAM: CT ANGIOGRAPHY CHEST WITH CONTRAST TECHNIQUE: Multidetector CT imaging of the chest was performed using the standard protocol during bolus administration of intravenous contrast. Multiplanar CT image reconstructions and MIPs were obtained to evaluate the vascular anatomy. CONTRAST:  100 cc Isovue 370 COMPARISON:  Portable chest obtained earlier today. Chest CT dated 04/14/2014. FINDINGS: Cardiovascular: Several small, linear filling defects in the right lower lobe pulmonary arteries. These are nonocclusive. Atheromatous arterial calcifications, including the coronary arteries and aorta. Mediastinum/Nodes: No enlarged mediastinal, hilar, or axillary lymph nodes. Thyroid gland, trachea, and esophagus demonstrate no significant findings. Lungs/Pleura: Posteromedial right lower lobe endobronchial occluding  soft tissue and atelectasis. The endobronchial soft tissue density is not unchanged. Small left pleural effusion. The lungs are mildly hyperexpanded. Upper Abdomen: Reflux of contrast into the inferior vena cava and hepatic veins. Musculoskeletal: Thoracic and lower cervical spine degenerative changes. Review of the MIP images confirms the above  findings. IMPRESSION: 1. Several small, linear right lower lobe pulmonary arterial filling defects. These are nonocclusive and are compatible with subacute emboli. No definite acute pulmonary emboli seen. 2. Chronic soft tissue density filling a right lower lobe bronchus with associated atelectasis. The chronicity is compatible with a chronic mucous plug or scarring rather than a neoplastic process. 3. Small left pleural effusion. 4. Mild changes of COPD. 5. Calcified coronary artery and aortic atherosclerosis. Aortic Atherosclerosis (ICD10-I70.0) and Emphysema (ICD10-J43.9). Electronically Signed   By: Claudie Revering M.D.   On: 11/04/2016 19:52   Dg Chest Portable 1 View  Result Date: 11/04/2016 CLINICAL DATA:  Shortness of breath. EXAM: PORTABLE CHEST 1 VIEW COMPARISON:  Chest x-ray dated November 04, 2015. FINDINGS: The cardiomediastinal silhouette remains mildly enlarged. Unchanged left chest wall AICD. Atherosclerotic calcification of the aortic arch. Mild pulmonary vascular congestion. No pleural effusion or pneumothorax. Bibasilar atelectasis. No acute osseous abnormality. IMPRESSION: Cardiomegaly and mild pulmonary vascular congestion without overt pulmonary edema. Electronically Signed   By: Titus Dubin M.D.   On: 11/04/2016 17:51   Ct Angio Abd/pel W And/or Wo Contrast  Result Date: 11/26/2016 CLINICAL DATA:  Generalized weakness. Abdominal pain with radiation to bilateral flanks. Known abdominal aortic aneurysm. EXAM: CTA ABDOMEN AND PELVIS wITHOUT AND WITH CONTRAST TECHNIQUE: Multidetector CT imaging of the abdomen and pelvis was performed using the standard protocol during bolus administration of intravenous contrast. Multiplanar reconstructed images and MIPs were obtained and reviewed to evaluate the vascular anatomy. CONTRAST:  100 cc Isovue 370 intravenously. COMPARISON:  None. FINDINGS: VASCULAR Aorta: Atherosclerotic disease of the aorta with calcified and noncalcified plaque. Ectatic  aorta. Infra renal fusiform abdominal aortic aneurysm measuring 4.6 by 4.2 cm. The aneurysmal sac measures 6.5 cm in craniocaudal direction. Crescent-shaped hypoattenuated area along the left wall of the aorta likely represents an intramural thrombus. There is an associated aneurysmal dilation of the bilateral common iliac artery is, the larger on the left measures 3.2 cm. Celiac: Patent without evidence of aneurysm, dissection, vasculitis or significant stenosis. SMA: Patent without evidence of aneurysm, dissection, vasculitis or significant stenosis. Renals: Both renal arteries are patent without evidence of aneurysm, dissection, vasculitis, fibromuscular dysplasia or significant stenosis. IMA: Patent without evidence of aneurysm, dissection, vasculitis or significant stenosis. Arises from the anterior portion of the aneurysmal aorta. Inflow: Patent without evidence of aneurysm, dissection, vasculitis or significant stenosis. Proximal Outflow: Bilateral common femoral and visualized portions of the superficial and profunda femoral arteries are patent without evidence of aneurysm, dissection, vasculitis or significant stenosis. Veins: No obvious venous abnormality within the limitations of this arterial phase study. Review of the MIP images confirms the above findings. NON-VASCULAR Lower chest: Soft tissue impaction of the segmental bronchi to the basilar segments of bilateral lower lobes. Persistent right lower lobe posteromedial endobronchial soft tissue with surrounding atelectasis. Hepatobiliary: Indeterminate 7 mm left lower lobe hypoattenuated lesion. Small amount of pericholecystic fluid. Pancreas: Unremarkable. No pancreatic ductal dilatation or surrounding inflammatory changes. Spleen: Normal in size without focal abnormality. Adrenals/Urinary Tract: Adrenal glands are unremarkable. Kidneys are without renal calculi, suspicious focal lesion, or hydronephrosis. Right renal cysts. Mild symmetric mucosal  thickening of the urinary bladder. Stomach/Bowel: Stomach is within normal  limits. No evidence of bowel wall thickening, distention, or inflammatory changes. Lymphatic: No evidence of lymphadenopathy. Reproductive: Enlarged prostate gland. Radiation seeds versus surgical clips within the prostate gland. Other: Chest wall edema. Moderate amount of abdominopelvic ascites with water density. Musculoskeletal: Multilevel osteoarthritic changes of the lumbosacral spine. IMPRESSION: VASCULAR Fusiform infrarenal abdominal aortic aneurysm, measuring 4.6 cm in greatest transverse dimension. Crescent-shaped hypoattenuation within the aneurysmal sac along the lateral aortic wall likely represents intramural thrombus. Recommend followup by abdomen and pelvis CTA in 6 months, and vascular surgery referral/consultation if not already obtained. This recommendation follows ACR consensus guidelines: White Paper of the ACR Incidental Findings Committee II on Vascular Findings. J Am Coll Radiol 2013; 10:789-794. Extension of the aneurysmal dilation to the bilateral common iliac arteries, left greater than right, with maximum diameter of 3.2 cm. Atherosclerotic disease of the aorta in its abdominal attributes. The named abdominal arterial branches however are patent. NON-VASCULAR Water density abdominopelvic ascites and chest wall edema. Pericholecystic fluid. It is difficult to determine whether this represent inflammatory changes associated with the gallbladder or is due to presence of ascites. Indeterminate 7 mm hypoattenuated nodule in the left lobe of the liver. Enlargement of the prostate gland with mild mucosal thickening of the urinary bladder, which may be due to ongoing outlet obstruction. Persistent endobronchial soft tissue versus mucous obstruction in bilateral lower lobes with soft tissue peribronchial density in the medial posterior aspect of the right lower lobe. This finding may represent area of atelectasis, due to  mucous plugging, however pulmonary malignancy with associated endobronchial lesion cannot be excluded. Electronically Signed   By: Fidela Salisbury M.D.   On: 11/26/2016 20:15    Time Spent in minutes  35   Louellen Molder M.D on 11/28/2016 at 9:50 AM  Between 7am to 7pm - Pager - [phone]  After 7pm go to [email or web address] - password [facility]  [facility] -  Office  [phone]

## 2016-11-28 NOTE — Telephone Encounter (Signed)
Per instructions from Dr.Fields, I scheduled an appt for this patient to have a CTA abd/pelvis at [facility] on Tues 12/06/16 at 1pm. He needs to arrive at 12:45pm at the radiology department of [facility]. He is also scheduled to see Dr.Nestor w/ [facility] on Monday 12/05/16 at 10:15am. He needs to arrive at 10am. This is for pre-operative pulmonary clearance. [place] 2nd floor. Dr.Young is no longer accepting new patients. The patient had requested that he see Dr.Young. Then he is to see Dr.Fields at our office on 12/22/16 at 12:45pm to follow up on the above appts. I mailed a letter with all this information and attempted times two to reach the patient by phone. No answer and no VM set up. awt
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
## 2016-11-28 NOTE — Care Management Important Message (Signed)
Important Message  Patient Details  Name: DENALI BECVAR MRN: [id number] Date of Birth: 12-02-1930   Medicare Important Message Given:  Yes    Errin Whitelaw, Chauncey Reading, RN 11/28/2016, 2:23 PM

## 2016-11-28 NOTE — Care Management Note (Signed)
Case Management Note  Patient Details  Name: Cody George MRN: [id number] Date of Birth: 1930-09-22  Subjective/Objective:    Adm with CHF and rapid afib. From home with wife. Ind with ADL's. Discussed HF and monitoring fluid, patient reports he had only been weighing every Friday at the gym. He does have scales at home, CM educated patient on weighing daily. Appears patient may need a dietician consult. If not ordered, CM will order. Patient is acutely on oxygen. Has a cane, uses only occasionally. Discussed patient's eligibility for [facility] services, he declines several times.  He does have a PCP, still drives, and reports no issues affording medications.                Action/Plan: Anticipate DC home with self care. CM will follow for needs.   Expected Discharge Date:     11/29/2016         Expected Discharge Plan:  Home/Self Care  In-House Referral:     Discharge planning Services  CM Consult  Post Acute Care Choice:    Choice offered to:     DME Arranged:    DME Agency:     HH Arranged:    HH Agency:     Status of Service:  In process, will continue to follow  If discussed at Long Length of Stay Meetings, dates discussed:    Additional Comments:  Merryl Buckels, Chauncey Reading, RN 11/28/2016, 2:19 PM

## 2016-11-29 LAB — BASIC METABOLIC PANEL
Anion gap: 8 (ref 5–15)
BUN: 17 mg/dL (ref 6–20)
CHLORIDE: 86 mmol/L — AB (ref 101–111)
CO2: 40 mmol/L — AB (ref 22–32)
Calcium: 9 mg/dL (ref 8.9–10.3)
Creatinine, Ser: 0.99 mg/dL (ref 0.61–1.24)
GFR calc Af Amer: >60 mL/min (ref >60)
GLUCOSE: 94 mg/dL (ref 65–99)
POTASSIUM: 4.7 mmol/L (ref 3.5–5.1)
Sodium: 134 mmol/L — ABNORMAL LOW (ref 135–145)

## 2016-11-29 MED ORDER — METOPROLOL SUCCINATE ER 50 MG PO TB24
100.0000 mg | ORAL_TABLET | Freq: Two times a day (BID) | ORAL | Status: DC
  Administered 2016-11-29 (×2): 100 mg via ORAL
  Filled 2016-11-29 (×2): qty 2

## 2016-11-29 MED ORDER — FUROSEMIDE 10 MG/ML IJ SOLN
40.0000 mg | Freq: Every day | INTRAMUSCULAR | Status: DC
  Administered 2016-11-30 – 2016-12-01 (×2): 40 mg via INTRAVENOUS
  Filled 2016-11-29 (×2): qty 4

## 2016-11-29 MED ORDER — GUAIFENESIN ER 600 MG PO TB12
1200.0000 mg | ORAL_TABLET | Freq: Two times a day (BID) | ORAL | Status: DC
Start: 2016-11-29 — End: 2016-12-01
  Administered 2016-11-29 – 2016-12-01 (×5): 1200 mg via ORAL
  Filled 2016-11-29 (×5): qty 2

## 2016-11-29 MED ORDER — LEVALBUTEROL HCL 0.63 MG/3ML IN NEBU
0.6300 mg | INHALATION_SOLUTION | Freq: Four times a day (QID) | RESPIRATORY_TRACT | Status: DC
  Administered 2016-11-29 – 2016-11-30 (×6): 0.63 mg via RESPIRATORY_TRACT
  Filled 2016-11-29 (×6): qty 3

## 2016-11-29 NOTE — Consult Note (Signed)
Consult requested by: [facility], Dr. Cydney Ok Consult requested for: Abnormal chest CT  HPI: This is an 83 year old who came to the emergency department with what appeared to be acute on chronic systolic heart failure. He has multiple other medical problems including chronic atrial fib on anticoagulation, hypertension, abdominal aortic aneurysm, prostate cancer, and although a diagnosis of COPD is not listed he does have shortness of breath at baseline significant smoking history and has cough productive of a white sputum on a daily basis. He had gained about 10 pounds and noticed a lot of swelling of his legs. He felt like his abdomen was swollen also. He denies any chest pain. No fever or chills. He was admitted with acute on chronic systolic heart failure and atrial fibrillation with RVR. CT of his abdomen and pelvis which I personally reviewed shows it in the visible portions of the lung he has what looks like some mucus impaction in the lower lobes with also a peribronchial density in the right lower lobe. He's not had any nausea vomiting diarrhea.  Past Medical History:  Diagnosis Date  . Acute systolic congestive heart failure ([place]) 12/07/2015  . Arthritis   . Cellulitis   . Community acquired pneumonia 04/14/2014  . Deep vein thrombosis (DVT) of left upper extremity ([place]) 04/13/2016  . Depression 01/06/2016  . Dysrhythmia    PAF  . GERD (gastroesophageal reflux disease)   . History of nuclear stress test 10/05/2010   dipyridamole; normal pattern of perfusion in all regions; no significant ishcemia, low risk scan   . Hypertension   . OA (osteoarthritis)   . Pacemaker 07/22/2011   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 Paroxysmal atrial fibrillation (HCC)   . Prostate cancer ([place])    radiation  . Shortness of breath      Family History  Problem Relation Age of Onset  . Sudden Cardiac Death Neg Hx     He does not know of any  family history of pulmonary disease. Social History   Social History  . Marital status: Married    Spouse name: N/A  . Number of children: 5  . Years of education: N/A   Occupational History  .  Retired   Social History Main Topics  . Smoking status: Current Every Day Smoker    Packs/day: 0.50    Years: 65.00  . Smokeless tobacco: Never Used     Comment: now 1/2 ppd (07/09/14)  . Alcohol use No  . Drug use: No  . Sexual activity: Not Currently   Other Topics Concern  . None   Social History Narrative  . None     ROS: Except as mentioned 10 point review of systems is negative    Objective: Vital signs in last 24 hours: Temp:  [97 F (36.1 C)-97.9 F (36.6 C)] 97 F (36.1 C) (10/16 0000) Pulse Rate:  [91-137] 104 (10/16 0600) Resp:  [14-24] 21 (10/16 0600) BP: (89-132)/(66-89) 116/78 (10/16 0600) SpO2:  [90 %-98 %] 95 % (10/16 0600) Weight:  [68.5 kg (151 lb 0.2 oz)] 68.5 kg (151 lb 0.2 oz) (10/16 0323) Weight change: -2 kg (-4 lb 6.6 oz) Last BM Date: 11/26/16  Intake/Output from previous day: 10/15 0701 - 10/16 0700 In: 219.9 [I.V.:219.9] Out: 3500 [Urine:3500]  PHYSICAL EXAM Constitutional: He is awake and alert and in no acute distress. He is thin. Eyes: Pupils react EOMI. Ears nose mouth and throat: His mucous membranes are  moist. Throat is clear. Hearing is grossly normal. Cardiovascular: His heart is irregularly irregular with a rate of about 100-110. I don't hear a gallop now. Pulmonary: His respiratory effort is mildly increased. His lungs show bilateral wheezing and rhonchi. He has pacemaker in the area of his left clavicle. Gastrointestinal: His abdomen is soft with no masses. Skin: Warm and dry. Neurological: No focal abnormalities. Psychiatric: He appears mildly anxious  Lab Results: Basic Metabolic Panel:  Recent Labs  11/28/16 0442 11/29/16 0510  NA 135 134*  K 4.6 4.7  CL 90* 86*  CO2 37* 40*  GLUCOSE 90 94  BUN 18 17  CREATININE 0.91  0.99  CALCIUM 8.8* 9.0   Liver Function Tests:  Recent Labs  11/26/16 1659  AST 24  ALT 26  ALKPHOS 59  BILITOT 1.2  PROT 6.1*  ALBUMIN 4.0   No results for input(s): LIPASE, AMYLASE in the last 72 hours. No results for input(s): AMMONIA in the last 72 hours. CBC:  Recent Labs  11/26/16 1659  WBC 9.9  NEUTROABS 8.2*  HGB 14.2  HCT 41.8  MCV 95.7  PLT 144*   Cardiac Enzymes:  Recent Labs  11/26/16 1659  TROPONINI <0.03   BNP: No results for input(s): PROBNP in the last 72 hours. D-Dimer: No results for input(s): DDIMER in the last 72 hours. CBG: No results for input(s): GLUCAP in the last 72 hours. Hemoglobin A1C: No results for input(s): HGBA1C in the last 72 hours. Fasting Lipid Panel: No results for input(s): CHOL, HDL, LDLCALC, TRIG, CHOLHDL, LDLDIRECT in the last 72 hours. Thyroid Function Tests: No results for input(s): TSH, T4TOTAL, FREET4, T3FREE, THYROIDAB in the last 72 hours. Anemia Panel: No results for input(s): VITAMINB12, FOLATE, FERRITIN, TIBC, IRON, RETICCTPCT in the last 72 hours. Coagulation: No results for input(s): LABPROT, INR in the last 72 hours. Urine Drug Screen: Drugs of Abuse  No results found for: LABOPIA, COCAINSCRNUR, LABBENZ, AMPHETMU, THCU, LABBARB  Alcohol Level: No results for input(s): ETH in the last 72 hours. Urinalysis:  Recent Labs  11/26/16 1706  COLORURINE YELLOW  LABSPEC 1.010  PHURINE 7.0  GLUCOSEU NEGATIVE  HGBUR NEGATIVE  BILIRUBINUR NEGATIVE  KETONESUR NEGATIVE  PROTEINUR NEGATIVE  NITRITE NEGATIVE  LEUKOCYTESUR NEGATIVE   Misc. Labs:   ABGS: No results for input(s): PHART, PO2ART, TCO2, HCO3 in the last 72 hours.  Invalid input(s): PCO2   MICROBIOLOGY: Recent Results (from the past 240 hour(s))  MRSA PCR Screening     Status: None   Collection Time: 11/27/16  2:08 AM  Result Value Ref Range Status   MRSA by PCR NEGATIVE NEGATIVE Final    Comment:        The GeneXpert MRSA Assay  (FDA approved for NASAL specimens only), is one component of a comprehensive MRSA colonization surveillance program. It is not intended to diagnose MRSA infection nor to guide or monitor treatment for MRSA infections.     Studies/Results: No results found.  Medications:  Prior to Admission:  Prescriptions Prior to Admission  Medication Sig Dispense Refill Last Dose  . ELIQUIS 5 MG TABS tablet TAKE ONE TABLET TWICE DAILY 180 tablet 1 11/26/2016 at Unknown time  . furosemide (LASIX) 40 MG tablet TAKE ONE (1) TABLET EACH DAY 30 tablet 11 11/26/2016 at Unknown time  . HYDROcodone-acetaminophen (NORCO/VICODIN) 5-325 MG tablet One tablet every four hours as needed for acute pain.  Limit of five days per [place] statue. 30 tablet 0 Past Week at Unknown time  .  metoprolol succinate (TOPROL-XL) 50 MG 24 hr tablet Take 1 tablet (50 mg total) by mouth daily. 90 tablet 3 unknown  . mirtazapine (REMERON) 30 MG tablet Take 30 mg by mouth at bedtime.    11/25/2016 at Unknown time  . tamsulosin (FLOMAX) 0.4 MG CAPS capsule Take 0.4 mg by mouth every evening.    11/25/2016 at Unknown time  . vitamin B-12 (CYANOCOBALAMIN) 1000 MCG tablet Take 1,000 mcg by mouth daily.   11/26/2016 at Unknown time  . VITAMIN C, CALCIUM ASCORBATE, PO Take 1,000 mg by mouth daily.    11/26/2016 at Unknown time  . VITAMIN D, CHOLECALCIFEROL, PO Take 1,000 mg by mouth daily.    11/26/2016 at Unknown time   Scheduled: . apixaban  5 mg Oral BID  . digoxin  0.25 mg Oral Daily  . furosemide  40 mg Intravenous Q12H  . metoprolol succinate  100 mg Oral Daily  . metoprolol succinate  50 mg Oral QPM  . mirtazapine  30 mg Oral QHS  . nicotine  14 mg Transdermal Daily  . sodium chloride flush  3 mL Intravenous Q12H  . tamsulosin  0.4 mg Oral QPC supper  . vitamin B-12  1,000 mcg Oral Daily   Continuous: . sodium chloride    . diltiazem (CARDIZEM) infusion 5 mg/hr (11/28/16 1835)   JHE:RDEYCX chloride, acetaminophen,  ALPRAZolam, HYDROcodone-acetaminophen, ondansetron (ZOFRAN) IV, sodium chloride flush  Assesment: He has acute on chronic systolic heart failure. He is improving.  He has atrial fibrillation with RVR which is better.  He has abnormal CT. I would plan to treat him for mucus impaction and then repeat CT of the chest as an outpatient. If he has persistent abnormalities he will need bronchoscopy  I think he clearly has COPD and will need outpatient pulmonary function testing Principal Problem:   Acute on chronic systolic CHF (congestive heart failure) (HCC) Active Problems:   Atrial fibrillation with RVR (HCC)   AAA (abdominal aortic aneurysm) (HCC)   Bronchial obstruction   Hyponatremia   Pressure injury of skin    Plan: As above    LOS: 3 days   Kymberli Wiegand L 11/29/2016, 8:29 AM

## 2016-11-29 NOTE — Plan of Care (Signed)
Problem: Food- and Nutrition-Related Knowledge Deficit (NB-1.1) Goal: Nutrition education Formal process to instruct or train a patient/client in a skill or to impart knowledge to help patients/clients voluntarily manage or modify food choices and eating behavior to maintain or improve health. Outcome: Adequate for Discharge Nutrition Education Note  RD consulted for nutrition education regarding CHF.  Went through diet recall. Breakfast: Egg sandwich from "[employer]" with slice of tomato on it. Does this nearly every day Lunch: Sandwiches (bolgna, pimiento cheese, store bought chicken salad) or can of vegetables Dinner: Typically meal will consist of a meat and canned vegetable Bevs: Drinks ~12 oz diet soda/day. Drinks quite a bit of water, but could estimate Other: Does not use the salt shaker. Does read labels. Almost all veggies they choose are canned. Eats deli meats, frozen meals. Does not eat condensed soups, hotdogs or canned meats w/ any regularity  RD expressed some concern over the egg sandwich he eats daily from [employer]. RD is not familiar with this specific restaurant, but stated, in general, fast food restaurants used eggs that have been preprepared and are high in salt. He maintains the eggs are made prepared fresh and the bread comes from a normal sandwich loaf, not biscuits.   He and his wife will consume frozen meals once in a while, such as potpies. RD stated that frozen meals can be extremely high in sodium. Asked him to try to find meals that had <400 mg/meal. If he could not do this, choose one with the lowest amount he could find. Encouraged to choose fresh items over canned/prepared items.   His sandwiches sound to be one of the largest contributors of sodium to his diet. He uses prepackaged, high sodium meats. RD advised how high in sodium these items truly are. Recommended baking fresh meats such as chicken/beef and then slicing them for his sandwiches. For  his salami/bologna, he should try to eat these less frequently, such as only 1x/week. Finally, RD recommended that he prepare his own chicken salad instead of using the store bought kind.   He eats mostly canned vegetables. He says he does rinse them off. RD stated he could rinse his deli meats off in the same way.   He does not weigh himself daily, only weekly. Emphasized the importance of doing this more regularly.   RD provided handout titled "Heart Failure Nutrition Therapy"  from the Academy of [employer] Dietetics. Showed lists of recommended vs not recommended items. Highlighted the specific items we had discussed.   RD emphasized that, at his age, he deserves to be able to enjoy some of his favorite foods. RD just asked that he does this in moderation. If he has a salty item in the mornings, then try to avoid having another salty item at [facility] or [employer]  Summary of Reccomendations 1. Choose frozen meals with <400 mg sodium 2. Make sandwiches from fresh meat.  3. Eat salami/bologna sandwich only 1x per week 4. Prepare chicken salad at home, do not purchase store bought 5. Be careful and cognizant of how his daily egg sandwich is prepared. 6. Weigh self daily or close to daily.  7. Keep rinsing vegetables. Could rinse deli meats as well.   Expect Good compliance. Pt was extremely pleasant and receptive to all recommendations. At baseline, he already tries to watch how much sodium he consumes.    Body mass index is 22.3 kg/m. Pt meets criteria for Healthy wt for ht  based on current BMI.  Current diet order is Heart Healthy , patient is consuming approximately 75-100% of meals at this time. Labs and medications reviewed. No further nutrition interventions warranted at this time.  If additional nutrition issues arise, please re-consult RD.   Burtis Junes RD, LDN, CNSC Clinical Nutrition Pager: [phone] 11/29/2016 2:37 PM

## 2016-11-29 NOTE — Progress Notes (Signed)
PROGRESS NOTE                                                                                                                                                                                                             Patient Demographics:    Cody George, is a 83 y.o. male, DOB - Nov 13, 1930, GUR:[id number]  Admit date - 11/26/2016   Admitting Physician Vianne Bulls, MD  Outpatient Primary MD for the patient is Celene Squibb, MD  LOS - 3  Outpatient Specialists: Dr. Debara Pickett Dr. Caryl Comes Dr. Oneida Alar (vascular surgery)  Chief Complaint  Patient presents with  . Abdominal Pain       Brief Narrative  83 year old male with history of paroxysmalA. 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 He was recently diagnosed with have AAA an idea aneurysm incidentally on CT scan and informs being seen by vascular surgeon Dr. Oneida Alar as outpatient. In the ED he was tachypneic to the 30s and in rapid A. Fib with heart rate up to 150s. Chest x-ray showed findings of pulmonary edema. Blood work showed mild hyponatremia and mild cytopenia.BNP elevated to 1175. CT angiogram of the abdomen done for his abdominal distention showed fusiform infrarenal AAA and iliac aneurysms. Vascular surgery consulted by ED physician who recommended no acute intervention and outpatient follow-up. Patient admitted to stepdown unit for A. Fib with RVR and acute on chronic systolic CHF.    Subjective:   Heart rate mostly stable past 24 hrs, elevated to 120s this am. Frustrated lying down and wants to move around. Breathing  better   Assessment  & Plan :    Principal Problem:  Atrial fibrillation with RVR (HCC) Cardizem drip reduced to 5 mL per hour. Heart rate better controlled after increasing toprol dose (100 mg am + 50 mg p.m.) . Continue digoxin. Patient intolerant to amiodarone. Continue anticoagulation.  Could possibly increase Toprol dose 200 mg twice a day and wean off Cardizem. Will defer to cardiology.      Acute on chronic systolic CHF (  congestive heart failure) (HCC) Diuresing quite well on IV Lasix (-8.7 L since admission). Continue strict I/O. 2-D echo with worsening EF of 30%. Added ACE inhibitor.  Active Problems:      AAA (abdominal aortic aneurysm) (HCC) CT angiogram done in the ED shows infrarenal fusiform abdominal aortic aneurysm measuring 4.6 x 4.2 cm with the aneurysmal sac measuring 6.5 cm. Associated aneurysmal dilatation of bilateral common iliac artery with larger on the left measuring 3.2 cm.recommends follow-up CT in 6 months. Dr. Kasandra Knudsen was consulted by ED physician who recommended outpatient follow-up. Patient reports that he saw Dr. Oneida Alar recently recommended monitoring and outpatient follow-up.    Bronchial obstruction As seen on CT with findings of persistent endobronchial soft tissue versus mucus obstruction in bilateral lower lobes which could be either due to mucus plugging with atelectasis versus endobronchial lesion.patient does report chronic cough. There is also a 7 mm indeterminate nodule in the left lower liver.  Pulmonary consulted.    Hyponatremia Mild. Possibly hypervolemic. Monitor with diuresis.    Pressure injury of skin Care per nursing.      Code Status : full code  Family Communication  : none at bedside  Disposition Plan  : home once improved  Barriers For Discharge : active symptoms  Consults  :  Cardiology   Procedures  :  CT angiogram of the abdomen 2-D echo  DVT Prophylaxis  :  eliquis  Lab Results  Component Value Date    PLT 144 (L) 11/26/2016    Antibiotics  :   Anti-infectives    None        Objective:   Vitals:   11/29/16 0323 11/29/16 0400 11/29/16 0500 11/29/16 0600  BP:  94/66 99/74 116/78  Pulse:  (!) 102 95 (!) 104  Resp:  19 18 (!) 21  Temp:      TempSrc:      SpO2:  91% 90% 95%  Weight: 68.5 kg (151 lb 0.2 oz)     Height:        Wt Readings from Last 3 Encounters:  11/29/16 68.5 kg (151 lb 0.2 oz)  11/23/16 75.8 kg (167 lb)  11/04/16 74.4 kg (164 lb)     Intake/Output Summary (Last 24 hours) at 11/29/16 0827 Last data filed at 11/29/16 0746  Gross per 24 hour  Intake           219.92 ml  Output             4700 ml  Net         -4480.08 ml     Physical Exam Gen.: Elderly male not in distress, hard of hearing HEENT: Moist mucosa, supple neck Chest: Scattered rhonchi bilaterally CVS: S1 and S2 irregularly irregular, no murmurs rub or gallop GI: Soft, nondistended, nontender Musculoskeletal: 1+ pitting edema bilaterally (mostly confined to lower tibia)      Data Review:    CBC  Recent Labs Lab 11/26/16 1659  WBC 9.9  HGB 14.2  HCT 41.8  PLT 144*  MCV 95.7  MCH 32.5  MCHC 34.0  RDW 15.0  LYMPHSABS 0.5*  MONOABS 1.2*  EOSABS 0.0  BASOSABS 0.0    Chemistries   Recent Labs Lab 11/26/16 1659 11/27/16 0454 11/28/16 0442 11/29/16 0510  NA 133* 133* 135 134*  K 4.3 3.9 4.6 4.7  CL 94* 90* 90* 86*  CO2 30 33* 37* 40*  GLUCOSE 119* 91 90 94  BUN 21* 18 18 17   CREATININE 0.79  0.69 0.91 0.99  CALCIUM 9.2 8.9 8.8* 9.0  AST 24  --   --   --   ALT 26  --   --   --   ALKPHOS 59  --   --   --   BILITOT 1.2  --   --   --    ------------------------------------------------------------------------------------------------------------------ No results for input(s): CHOL, HDL, LDLCALC, TRIG, CHOLHDL, LDLDIRECT in the last 72 hours.  No results found for:  HGBA1C ------------------------------------------------------------------------------------------------------------------ No results for input(s): TSH, T4TOTAL, T3FREE, THYROIDAB in the last 72 hours.  Invalid input(s): FREET3 ------------------------------------------------------------------------------------------------------------------ No results for input(s): VITAMINB12, FOLATE, FERRITIN, TIBC, IRON, RETICCTPCT in the last 72 hours.  Coagulation profile No results for input(s): INR, PROTIME in the last 168 hours.  No results for input(s): DDIMER in the last 72 hours.  Cardiac Enzymes  Recent Labs Lab 11/26/16 1659  TROPONINI <0.03   ------------------------------------------------------------------------------------------------------------------    Component Value Date/Time   BNP 1,175.0 (H) 11/26/2016 1659   BNP 392.5 (H) 11/04/2015 1208    Inpatient Medications  Scheduled Meds: . apixaban  5 mg Oral BID  . digoxin  0.25 mg Oral Daily  . furosemide  40 mg Intravenous Q12H  . metoprolol succinate  100 mg Oral Daily  . metoprolol succinate  50 mg Oral QPM  . mirtazapine  30 mg Oral QHS  . nicotine  14 mg Transdermal Daily  . sodium chloride flush  3 mL Intravenous Q12H  . tamsulosin  0.4 mg Oral QPC supper  . vitamin B-12  1,000 mcg Oral Daily   Continuous Infusions: . sodium chloride    . diltiazem (CARDIZEM) infusion 5 mg/hr (11/28/16 1835)   PRN Meds:.sodium chloride, acetaminophen, ALPRAZolam, HYDROcodone-acetaminophen, ondansetron (ZOFRAN) IV, sodium chloride flush  Micro Results Recent Results (from the past 240 hour(s))  MRSA PCR Screening     Status: None   Collection Time: 11/27/16  2:08 AM  Result Value Ref Range Status   MRSA by PCR NEGATIVE NEGATIVE Final    Comment:        The GeneXpert MRSA Assay (FDA approved for NASAL specimens only), is one component of a comprehensive MRSA colonization surveillance program. It is not intended to  diagnose MRSA infection nor to guide or monitor treatment for MRSA infections.     Radiology Reports Dg Chest 2 View  Result Date: 11/26/2016 CLINICAL DATA:  Dyspnea EXAM: CHEST  2 VIEW COMPARISON:  11/04/2016 FINDINGS: Diffuse interstitial coarsening, new. This could represent edema or infection. No confluent alveolar consolidation. No pleural effusions. Unremarkable hilar and mediastinal contours. Borderline heart size, unchanged. IMPRESSION: New diffuse coarsening of the interstitium. This could be due to infection or edema. No confluent consolidation. Electronically Signed   By: Andreas Newport M.D.   On: 11/26/2016 18:27   Ct Angio Chest Pe W And/or Wo Contrast  Result Date: 11/04/2016 CLINICAL DATA:  Shortness of breath since yesterday.  Smoker. EXAM: CT ANGIOGRAPHY CHEST WITH CONTRAST TECHNIQUE: Multidetector CT imaging of the chest was performed using the standard protocol during bolus administration of intravenous contrast. Multiplanar CT image reconstructions and MIPs were obtained to evaluate the vascular anatomy. CONTRAST:  100 cc Isovue 370 COMPARISON:  Portable chest obtained earlier today. Chest CT dated 04/14/2014. FINDINGS: Cardiovascular: Several small, linear filling defects in the right lower lobe pulmonary arteries. These are nonocclusive. Atheromatous arterial calcifications, including the coronary arteries and aorta. Mediastinum/Nodes: No enlarged mediastinal, hilar, or axillary lymph nodes. Thyroid gland, trachea, and esophagus demonstrate no significant findings.  Lungs/Pleura: Posteromedial right lower lobe endobronchial occluding soft tissue and atelectasis. The endobronchial soft tissue density is not unchanged. Small left pleural effusion. The lungs are mildly hyperexpanded. Upper Abdomen: Reflux of contrast into the inferior vena cava and hepatic veins. Musculoskeletal: Thoracic and lower cervical spine degenerative changes. Review of the MIP images confirms the above  findings. IMPRESSION: 1. Several small, linear right lower lobe pulmonary arterial filling defects. These are nonocclusive and are compatible with subacute emboli. No definite acute pulmonary emboli seen. 2. Chronic soft tissue density filling a right lower lobe bronchus with associated atelectasis. The chronicity is compatible with a chronic mucous plug or scarring rather than a neoplastic process. 3. Small left pleural effusion. 4. Mild changes of COPD. 5. Calcified coronary artery and aortic atherosclerosis. Aortic Atherosclerosis (ICD10-I70.0) and Emphysema (ICD10-J43.9). Electronically Signed   By: Claudie Revering M.D.   On: 11/04/2016 19:52   Dg Chest Portable 1 View  Result Date: 11/04/2016 CLINICAL DATA:  Shortness of breath. EXAM: PORTABLE CHEST 1 VIEW COMPARISON:  Chest x-ray dated November 04, 2015. FINDINGS: The cardiomediastinal silhouette remains mildly enlarged. Unchanged left chest wall AICD. Atherosclerotic calcification of the aortic arch. Mild pulmonary vascular congestion. No pleural effusion or pneumothorax. Bibasilar atelectasis. No acute osseous abnormality. IMPRESSION: Cardiomegaly and mild pulmonary vascular congestion without overt pulmonary edema. Electronically Signed   By: Titus Dubin M.D.   On: 11/04/2016 17:51   Ct Angio Abd/pel W And/or Wo Contrast  Result Date: 11/26/2016 CLINICAL DATA:  Generalized weakness. Abdominal pain with radiation to bilateral flanks. Known abdominal aortic aneurysm. EXAM: CTA ABDOMEN AND PELVIS wITHOUT AND WITH CONTRAST TECHNIQUE: Multidetector CT imaging of the abdomen and pelvis was performed using the standard protocol during bolus administration of intravenous contrast. Multiplanar reconstructed images and MIPs were obtained and reviewed to evaluate the vascular anatomy. CONTRAST:  100 cc Isovue 370 intravenously. COMPARISON:  None. FINDINGS: VASCULAR Aorta: Atherosclerotic disease of the aorta with calcified and noncalcified plaque. Ectatic  aorta. Infra renal fusiform abdominal aortic aneurysm measuring 4.6 by 4.2 cm. The aneurysmal sac measures 6.5 cm in craniocaudal direction. Crescent-shaped hypoattenuated area along the left wall of the aorta likely represents an intramural thrombus. There is an associated aneurysmal dilation of the bilateral common iliac artery is, the larger on the left measures 3.2 cm. Celiac: Patent without evidence of aneurysm, dissection, vasculitis or significant stenosis. SMA: Patent without evidence of aneurysm, dissection, vasculitis or significant stenosis. Renals: Both renal arteries are patent without evidence of aneurysm, dissection, vasculitis, fibromuscular dysplasia or significant stenosis. IMA: Patent without evidence of aneurysm, dissection, vasculitis or significant stenosis. Arises from the anterior portion of the aneurysmal aorta. Inflow: Patent without evidence of aneurysm, dissection, vasculitis or significant stenosis. Proximal Outflow: Bilateral common femoral and visualized portions of the superficial and profunda femoral arteries are patent without evidence of aneurysm, dissection, vasculitis or significant stenosis. Veins: No obvious venous abnormality within the limitations of this arterial phase study. Review of the MIP images confirms the above findings. NON-VASCULAR Lower chest: Soft tissue impaction of the segmental bronchi to the basilar segments of bilateral lower lobes. Persistent right lower lobe posteromedial endobronchial soft tissue with surrounding atelectasis. Hepatobiliary: Indeterminate 7 mm left lower lobe hypoattenuated lesion. Small amount of pericholecystic fluid. Pancreas: Unremarkable. No pancreatic ductal dilatation or surrounding inflammatory changes. Spleen: Normal in size without focal abnormality. Adrenals/Urinary Tract: Adrenal glands are unremarkable. Kidneys are without renal calculi, suspicious focal lesion, or hydronephrosis. Right renal cysts. Mild symmetric mucosal  thickening of the  urinary bladder. Stomach/Bowel: Stomach is within normal limits. No evidence of bowel wall thickening, distention, or inflammatory changes. Lymphatic: No evidence of lymphadenopathy. Reproductive: Enlarged prostate gland. Radiation seeds versus surgical clips within the prostate gland. Other: Chest wall edema. Moderate amount of abdominopelvic ascites with water density. Musculoskeletal: Multilevel osteoarthritic changes of the lumbosacral spine. IMPRESSION: VASCULAR Fusiform infrarenal abdominal aortic aneurysm, measuring 4.6 cm in greatest transverse dimension. Crescent-shaped hypoattenuation within the aneurysmal sac along the lateral aortic wall likely represents intramural thrombus. Recommend followup by abdomen and pelvis CTA in 6 months, and vascular surgery referral/consultation if not already obtained. This recommendation follows ACR consensus guidelines: White Paper of the ACR Incidental Findings Committee II on Vascular Findings. J Am Coll Radiol 2013; 10:789-794. Extension of the aneurysmal dilation to the bilateral common iliac arteries, left greater than right, with maximum diameter of 3.2 cm. Atherosclerotic disease of the aorta in its abdominal attributes. The named abdominal arterial branches however are patent. NON-VASCULAR Water density abdominopelvic ascites and chest wall edema. Pericholecystic fluid. It is difficult to determine whether this represent inflammatory changes associated with the gallbladder or is due to presence of ascites. Indeterminate 7 mm hypoattenuated nodule in the left lobe of the liver. Enlargement of the prostate gland with mild mucosal thickening of the urinary bladder, which may be due to ongoing outlet obstruction. Persistent endobronchial soft tissue versus mucous obstruction in bilateral lower lobes with soft tissue peribronchial density in the medial posterior aspect of the right lower lobe. This finding may represent area of atelectasis, due to  mucous plugging, however pulmonary malignancy with associated endobronchial lesion cannot be excluded. Electronically Signed   By: Fidela Salisbury M.D.   On: 11/26/2016 20:15    Time Spent in minutes  35   Louellen Molder M.D on 11/29/2016 at 8:27 AM  Between 7am to 7pm - Pager - [phone]  After 7pm go to [email or web address] - password [facility]  [facility] -  Office  [phone]

## 2016-11-29 NOTE — Progress Notes (Signed)
Progress Note  Patient Name: Cody George Date of Encounter: 11/29/2016  Primary Cardiologist: Dr. Lyman Bishop  Subjective   No chest pain or palpitations. Breathing more easily. Leg swelling has been getting better. Good appetite. No abdominal pain.  Inpatient Medications    Scheduled Meds: . apixaban  5 mg Oral BID  . digoxin  0.25 mg Oral Daily  . furosemide  40 mg Intravenous Q12H  . guaiFENesin  1,200 mg Oral BID  . levalbuterol  0.63 mg Nebulization Q6H  . metoprolol succinate  100 mg Oral Daily  . metoprolol succinate  50 mg Oral QPM  . mirtazapine  30 mg Oral QHS  . nicotine  14 mg Transdermal Daily  . sodium chloride flush  3 mL Intravenous Q12H  . tamsulosin  0.4 mg Oral QPC supper  . vitamin B-12  1,000 mcg Oral Daily   Continuous Infusions: . sodium chloride    . diltiazem (CARDIZEM) infusion 5 mg/hr (11/28/16 1835)   PRN Meds: sodium chloride, acetaminophen, ALPRAZolam, HYDROcodone-acetaminophen, ondansetron (ZOFRAN) IV, sodium chloride flush   Vital Signs    Vitals:   11/29/16 0323 11/29/16 0400 11/29/16 0500 11/29/16 0600  BP:  94/66 99/74 116/78  Pulse:  (!) 102 95 (!) 104  Resp:  19 18 (!) 21  Temp:      TempSrc:      SpO2:  91% 90% 95%  Weight: 151 lb 0.2 oz (68.5 kg)     Height:        Intake/Output Summary (Last 24 hours) at 11/29/16 0845 Last data filed at 11/29/16 0746  Gross per 24 hour  Intake           219.92 ml  Output             4700 ml  Net         -4480.08 ml   Filed Weights   11/27/16 0500 11/28/16 0500 11/29/16 0323  Weight: 160 lb 11.5 oz (72.9 kg) 155 lb 6.8 oz (70.5 kg) 151 lb 0.2 oz (68.5 kg)    Telemetry    Atrial fibrillation with RVR, intermittent ventricular paced beats. Personally reviewed.  Physical Exam   GEN: Elderly male, no acute distress. Neck: No JVD. Cardiac: Irregularly irregular, soft systolic murmur, no gallop.  Respiratory: Nonlabored. No wheezing. GI: Soft, nontender, bowel sounds  present. MS: 1-2+ lower leg edema; No deformity. Neuro:  Nonfocal. Psych: Alert and oriented x 3. Normal affect. Hearing impaired.  Labs    Chemistry Recent Labs Lab 11/26/16 1659 11/27/16 0454 11/28/16 0442 11/29/16 0510  NA 133* 133* 135 134*  K 4.3 3.9 4.6 4.7  CL 94* 90* 90* 86*  CO2 30 33* 37* 40*  GLUCOSE 119* 91 90 94  BUN 21* 18 18 17   CREATININE 0.79 0.69 0.91 0.99  CALCIUM 9.2 8.9 8.8* 9.0  PROT 6.1*  --   --   --   ALBUMIN 4.0  --   --   --   AST 24  --   --   --   ALT 26  --   --   --   ALKPHOS 59  --   --   --   BILITOT 1.2  --   --   --   GFRNONAA >60 >60 >60 >60  GFRAA >60 >60 >60 >60  ANIONGAP 9 10 8 8      Hematology Recent Labs Lab 11/26/16 1659  WBC 9.9  RBC 4.37  HGB 14.2  HCT 41.8  MCV 95.7  MCH 32.5  MCHC 34.0  RDW 15.0  PLT 144*    Cardiac Enzymes Recent Labs Lab 11/26/16 1659  TROPONINI <0.03   No results for input(s): TROPIPOC in the last 168 hours.   BNP Recent Labs Lab 11/26/16 1659  BNP 1,175.0*     Radiology    No results found.  Cardiac Studies   Echocardiogram 11/28/2016: Study Conclusions  - Left ventricle: The cavity size was normal. Wall thickness was   increased in a pattern of mild LVH. The estimated ejection   fraction was 30%. Diffuse hypokinesis with regional variation. - Aortic valve: Trileaflet; moderately calcified leaflets. There   was moderate stenosis. There was moderate regurgitation. Mean   gradient (S): 11 mm Hg. Peak gradient (S): 20 mm Hg. VTI ratio of   LVOT to aortic valve: 0.38. Valve area (VTI): 1.2 cm^2. - Mitral valve: Mildly calcified annulus. There was mild   regurgitation. - Left atrium: The atrium was moderately dilated. - Right ventricle: Pacer wire or catheter noted in right ventricle. - Right atrium: The atrium was mildly dilated. Central venous   pressure (est): 8 mm Hg. - Atrial septum: No defect or patent foramen ovale was identified. - Tricuspid valve: There was  mild-moderate regurgitation. - Pulmonary arteries: PA peak pressure: 43 mm Hg (S). - Pericardium, extracardiac: There was no pericardial effusion.  Impressions:  - Mild LVH with LVEF approximately 30% in the setting of atrial   fibrillation. There is diffuse hypokinesis with regional   variation. Indeterminate diastolic function. Moderate left atrial   enlargement. Mildly calcified mitral annulus with mild mitral   regurgitation. Moderate calcific aortic stenosis with moderate   aortic regurgitation. Device wire present within the right heart.   Mildly dilated right atrium. Mild to moderate tricuspid   regurgitation with estimated PASP 43 mmHg.  Patient Profile     83 y.o. 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 He presents with rapid atrial fibrillation in the setting of acute on chronic systolic heart failure.  Assessment & Plan    1. Persistent atrial fibrillation, currently with RVR. He is on oral Lanoxin, Toprol-XL with increasing doses, intravenous diltiazem. Also continues on Eliquis for stroke prophylaxis. He has a history of amiodarone intolerance.  2. Acute on chronic systolic heart failure. Follow-up echocardiogram reveals LVEF approximately 30% in the setting of atrial fibrillation with suboptimally controlled heart rate. He is diuresing well with improvement in leg edema. Weight down over 10 pounds if scales are accurate.  3. Tachycardia-bradycardia syndrome status post St. Jude pacemaker placement.  4. Abnormal chest CT showing evidence of mucus plugging, rule out endobronchial lesion. He was seen by Dr. Luan Pulling.  5. Essential hypertension, blood pressure is low normal to mildly reduced.  Discussed with patient. Plan to discontinue IV diltiazem. Increase Toprol-XL to 100 mg twice daily and continue Lanoxin. Continue Eliquis  for stroke prophylaxis. Reduce Lasix to 40 mg IV once daily. Trying to avoid use of oral calcium channel blocker in light of cardiomyopathy, but will see how he does in terms of heart rate control on escalating doses of beta blocker. Could consider moving to telemetry and gradually increasing activity as heart rate comes under better control.  Signed, Rozann Lesches, MD  11/29/2016, 8:45 AM

## 2016-11-30 DIAGNOSIS — I714 Abdominal aortic aneurysm, without rupture: Secondary | ICD-10-CM

## 2016-11-30 DIAGNOSIS — E871 Hypo-osmolality and hyponatremia: Secondary | ICD-10-CM

## 2016-11-30 LAB — BASIC METABOLIC PANEL
ANION GAP: 7 (ref 5–15)
BUN: 22 mg/dL — ABNORMAL HIGH (ref 6–20)
CO2: 39 mmol/L — ABNORMAL HIGH (ref 22–32)
Calcium: 9 mg/dL (ref 8.9–10.3)
Chloride: 87 mmol/L — ABNORMAL LOW (ref 101–111)
Creatinine, Ser: 0.97 mg/dL (ref 0.61–1.24)
GLUCOSE: 95 mg/dL (ref 65–99)
POTASSIUM: 5.1 mmol/L (ref 3.5–5.1)
Sodium: 133 mmol/L — ABNORMAL LOW (ref 135–145)

## 2016-11-30 MED ORDER — LEVALBUTEROL HCL 0.63 MG/3ML IN NEBU
0.6300 mg | INHALATION_SOLUTION | Freq: Three times a day (TID) | RESPIRATORY_TRACT | Status: DC
  Administered 2016-12-01 (×2): 0.63 mg via RESPIRATORY_TRACT
  Filled 2016-11-30 (×2): qty 3

## 2016-11-30 MED ORDER — METOPROLOL SUCCINATE ER 50 MG PO TB24
150.0000 mg | ORAL_TABLET | Freq: Two times a day (BID) | ORAL | Status: DC
  Administered 2016-11-30 – 2016-12-01 (×2): 150 mg via ORAL
  Filled 2016-11-30 (×2): qty 3

## 2016-11-30 NOTE — Evaluation (Signed)
Physical Therapy Evaluation Patient Details Name: Cody George MRN: [id number] DOB: 1930/07/19 Today's Date: 11/30/2016   History of Present Illness  Cody George is a 83 y.o. male with medical history significant for atrial fibrillation on Eliquis, chronic systolic CHF, hypertension, AAA, and prostate cancer treated with radiation, and into the emergency department for evaluation of swelling in the legs and abdomen with 10 pound weight gain over the past week. Patient reports a history of chronic bilateral leg swelling, always worse in the right, but states that this has worsened significantly over the past week. He reports a chronic unchanged cough. He also feels that he is having swelling in his abdomen, and also notes abdominal pain that has been chronic.He was recently noted to have a AAA and iliac aneurysms incidentally on a CT and has vascular surgery follow-up scheduled.    Clinical Impression  Patient limited for functional mobility as stated below secondary to BLE weakness, fatigue and poor standing balance.  Patient will benefit from continued physical therapy in hospital and recommended venue below to increase strength, balance, endurance for safe ADLs and gait.    Follow Up Recommendations Home health PT;Supervision - Intermittent    Equipment Recommendations  None recommended by PT    Recommendations for Other Services       Precautions / Restrictions Precautions Precautions: Fall Restrictions Weight Bearing Restrictions: No      Mobility  Bed Mobility Overal bed mobility: Needs Assistance Bed Mobility: Supine to Sit;Sit to Supine     Supine to sit: Supervision Sit to supine: Supervision      Transfers Overall transfer level: Needs assistance Equipment used: None Transfers: Sit to/from [employer] Transfers Sit to Stand: Supervision Stand pivot transfers: Min guard          Ambulation/Gait Ambulation/Gait assistance: Min  guard Ambulation Distance (Feet): 75 Feet Assistive device: None Gait Pattern/deviations: Decreased stride length   Gait velocity interpretation: Below normal speed for age/gender General Gait Details: Patient demonstrates slightly labored cadence with occasional standing rest breaks by leaning on wall, no loss of balance  Stairs            Wheelchair Mobility    Modified Rankin (Stroke Patients Only)       Balance Overall balance assessment: Needs assistance Sitting-balance support: Feet supported Sitting balance-Leahy Scale: Good     Standing balance support: No upper extremity supported;During functional activity Standing balance-Leahy Scale: Fair                               Pertinent Vitals/Pain Pain Assessment: No/denies pain    Home Living Family/patient expects to be discharged to:: Private residence Living Arrangements: Spouse/significant other Available Help at Discharge: Family Type of Home: House Home Access: Level entry;Stairs to enter Entrance Stairs-Rails: Right Entrance Stairs-Number of Steps: 2 Home Layout: One level Home Equipment: Cane - single point;Walker - 2 wheels;Walker - 4 wheels;Shower seat      Prior Function Level of Independence: Independent with assistive device(s)         Comments: Ambulates with SPC PRN     Hand Dominance        Extremity/Trunk Assessment   Upper Extremity Assessment Upper Extremity Assessment: Generalized weakness    Lower Extremity Assessment Lower Extremity Assessment: Generalized weakness    Cervical / Trunk Assessment Cervical / Trunk Assessment: Normal  Communication   Communication: No difficulties  Cognition Arousal/Alertness: Awake/alert Behavior During Therapy:  WFL for tasks assessed/performed Overall Cognitive Status: Within Functional Limits for tasks assessed                                        General Comments      Exercises      Assessment/Plan    PT Assessment Patient needs continued PT services  PT Problem List Decreased strength;Decreased activity tolerance;Decreased balance;Decreased mobility       PT Treatment Interventions Gait training;Functional mobility training;Stair training;Therapeutic activities;Therapeutic exercise;Patient/family education    PT Goals (Current goals can be found in the Care Plan section)  Acute Rehab PT Goals Patient Stated Goal: return home at [facility] Potential to Achieve Goals: Good    Frequency Min 3X/week   Barriers to discharge        Co-evaluation               AM-PAC PT "6 Clicks" Daily Activity  Outcome Measure Difficulty turning over in bed (including adjusting bedclothes, sheets and blankets)?: None Difficulty moving from lying on back to sitting on the side of the bed? : None Difficulty sitting down on and standing up from a chair with arms (e.g., wheelchair, bedside commode, etc,.)?: None Help needed moving to and from a bed to chair (including a wheelchair)?: A Little Help needed walking in hospital room?: A Little Help needed climbing 3-5 steps with a railing? : A Little 6 Click Score: 21    End of Session   Activity Tolerance: Patient tolerated treatment well;Patient limited by fatigue Patient left: in chair;with call bell/phone within reach;with nursing/sitter in room Nurse Communication: Mobility status PT Visit Diagnosis: Unsteadiness on feet (R26.81);Other abnormalities of gait and mobility (R26.89);Muscle weakness (generalized) (M62.81)    Time: 8416-6063 PT Time Calculation (min) (ACUTE ONLY): 28 min   Charges:   PT Evaluation $PT Eval Low Complexity: 1 Low PT Treatments $Therapeutic Activity: 23-37 mins   PT G Codes:        1:34 PM, Dec 24, 2016 Lonell Grandchild, MPT Physical Therapist with [facility] 336 [phone] office [phone] mobile phone

## 2016-11-30 NOTE — Progress Notes (Signed)
Progress Note  Patient Name: Cody George Date of Encounter: 11/30/2016  Primary Cardiologist: Dr. Lyman Bishop  Subjective   Feels better today. No palpitations or chest pain. Good appetite.  Inpatient Medications    Scheduled Meds: . apixaban  5 mg Oral BID  . digoxin  0.25 mg Oral Daily  . furosemide  40 mg Intravenous Daily  . guaiFENesin  1,200 mg Oral BID  . levalbuterol  0.63 mg Nebulization Q6H  . metoprolol succinate  100 mg Oral BID  . mirtazapine  30 mg Oral QHS  . nicotine  14 mg Transdermal Daily  . sodium chloride flush  3 mL Intravenous Q12H  . tamsulosin  0.4 mg Oral QPC supper  . vitamin B-12  1,000 mcg Oral Daily   Continuous Infusions: . sodium chloride     PRN Meds: sodium chloride, acetaminophen, ALPRAZolam, HYDROcodone-acetaminophen, ondansetron (ZOFRAN) IV, sodium chloride flush   Vital Signs    Vitals:   11/30/16 0458 11/30/16 0500 11/30/16 0600 11/30/16 0751  BP:  113/75 97/60   Pulse: 99 92  (!) 102  Resp: (!) 21 (!) 21 (!) 21 14  Temp: (!) 97.3 F (36.3 C)   (!) 97.2 F (36.2 C)  TempSrc: Oral   Oral  SpO2: 98% 98%  97%  Weight: 149 lb 11.1 oz (67.9 kg)     Height:        Intake/Output Summary (Last 24 hours) at 11/30/16 0838 Last data filed at 11/30/16 0458  Gross per 24 hour  Intake            28.58 ml  Output              975 ml  Net          -946.42 ml   Filed Weights   11/28/16 0500 11/29/16 0323 11/30/16 0458  Weight: 155 lb 6.8 oz (70.5 kg) 151 lb 0.2 oz (68.5 kg) 149 lb 11.1 oz (67.9 kg)    Telemetry    Atrial fibrillation with intermittent ventricular pacing. Personally reviewed.  Physical Exam   GEN: Elderly male. No acute distress.   Neck: No JVD. Cardiac: Irregularly irregular, soft systolic murmur, no gallop.  Respiratory: Nonlabored. No active wheezing. GI: Soft, nontender, bowel sounds present. MS: Improving lower leg edema; No deformity. Neuro:  Nonfocal. Psych: Alert and oriented x 3. Normal  affect.  Labs    Chemistry Recent Labs Lab 11/26/16 1659  11/28/16 0442 11/29/16 0510 11/30/16 0507  NA 133*  < > 135 134* 133*  K 4.3  < > 4.6 4.7 5.1  CL 94*  < > 90* 86* 87*  CO2 30  < > 37* 40* 39*  GLUCOSE 119*  < > 90 94 95  BUN 21*  < > 18 17 22*  CREATININE 0.79  < > 0.91 0.99 0.97  CALCIUM 9.2  < > 8.8* 9.0 9.0  PROT 6.1*  --   --   --   --   ALBUMIN 4.0  --   --   --   --   AST 24  --   --   --   --   ALT 26  --   --   --   --   ALKPHOS 59  --   --   --   --   BILITOT 1.2  --   --   --   --   GFRNONAA >60  < > >60 >60 >60  GFRAA >  60  < > >60 >60 >60  ANIONGAP 9  < > 8 8 7   < > = values in this interval not displayed.   Hematology Recent Labs Lab 11/26/16 1659  WBC 9.9  RBC 4.37  HGB 14.2  HCT 41.8  MCV 95.7  MCH 32.5  MCHC 34.0  RDW 15.0  PLT 144*    Cardiac Enzymes Recent Labs Lab 11/26/16 1659  TROPONINI <0.03   No results for input(s): TROPIPOC in the last 168 hours.   BNP Recent Labs Lab 11/26/16 1659  BNP 1,175.0*     Radiology    No results found.  Cardiac Studies   Echocardiogram 11/28/2016: Study Conclusions  - Left ventricle: The cavity size was normal. Wall thickness was   increased in a pattern of mild LVH. The estimated ejection   fraction was 30%. Diffuse hypokinesis with regional variation. - Aortic valve: Trileaflet; moderately calcified leaflets. There   was moderate stenosis. There was moderate regurgitation. Mean   gradient (S): 11 mm Hg. Peak gradient (S): 20 mm Hg. VTI ratio of   LVOT to aortic valve: 0.38. Valve area (VTI): 1.2 cm^2. - Mitral valve: Mildly calcified annulus. There was mild   regurgitation. - Left atrium: The atrium was moderately dilated. - Right ventricle: Pacer wire or catheter noted in right ventricle. - Right atrium: The atrium was mildly dilated. Central venous   pressure (est): 8 mm Hg. - Atrial septum: No defect or patent foramen ovale was identified. - Tricuspid valve: There was  mild-moderate regurgitation. - Pulmonary arteries: PA peak pressure: 43 mm Hg (S). - Pericardium, extracardiac: There was no pericardial effusion.  Impressions:  - Mild LVH with LVEF approximately 30% in the setting of atrial   fibrillation. There is diffuse hypokinesis with regional   variation. Indeterminate diastolic function. Moderate left atrial   enlargement. Mildly calcified mitral annulus with mild mitral   regurgitation. Moderate calcific aortic stenosis with moderate   aortic regurgitation. Device wire present within the right heart.   Mildly dilated right atrium. Mild to moderate tricuspid   regurgitation with estimated PASP 43 mmHg.  Patient Profile     83 y.o. 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 He presents with rapid atrial fibrillation in the setting of acute on chronic systolic heart failure.  Assessment & Plan    1. Persistent atrial fibrillation. Heart rate control is improving but not optimal. He continues on Toprol-XL and Lanoxin. Also on Eliquis for stroke prophylaxis.  2. Tachycardia-bradycardia syndrome status post St. Jude pacemaker placement.  3. Acute on chronic systolic heart failure, clinically improved with diuresis. Continues on Lasix at 40 mg IV daily. Creatinine stable with continued diuresis.  4. Normal chest CT showing evidence of mucous plugging, rule out endobronchial lesion. He has been evaluated by Dr. Luan Pulling with plan for outpatient imaging studies.  Increase Toprol-XL to 150 mg twice daily, continue Lanoxin. Would keep Lasix at 40 mg IV once daily for now, follow up urine output and BMET in a.m. Transfer to telemetry and increase activity to further assess heart rate control of atrial fibrillation.  Signed, Rozann Lesches, MD  11/30/2016, 8:38 AM

## 2016-11-30 NOTE — Progress Notes (Signed)
Subjective: He says he feels better. He is still coughing some. He apparently did not get the flutter valve so I've ordered that again. He has no other new complaints. He feels like his legs are much better.  Objective: Vital signs in last 24 hours: Temp:  [97.2 F (36.2 C)-98.9 F (37.2 C)] 97.2 F (36.2 C) (10/17 0751) Pulse Rate:  [89-138] 102 (10/17 0751) Resp:  [14-25] 14 (10/17 0751) BP: (91-113)/(60-87) 97/60 (10/17 0600) SpO2:  [84 %-99 %] 96 % (10/17 0842) Weight:  [67.9 kg (149 lb 11.1 oz)] 67.9 kg (149 lb 11.1 oz) (10/17 0458) Weight change: -0.6 kg (-1 lb 5.2 oz) Last BM Date: 11/26/16  Intake/Output from previous day: 10/16 0701 - 10/17 0700 In: 28.6 [I.V.:28.6] Out: 2175 [Urine:2175]  PHYSICAL EXAM General appearance: alert, cooperative and no distress Resp: rhonchi bilaterally Cardio: irregularly irregular rhythm GI: soft, non-tender; bowel sounds normal; no masses,  no organomegaly Extremities: Much less edema Good skin turgor  Lab Results:  Results for orders placed or performed during the hospital encounter of 11/26/16 (from the past 48 hour(s))  Basic metabolic panel     Status: Abnormal   Collection Time: 11/29/16  5:10 AM  Result Value Ref Range   Sodium 134 (L) 135 - 145 mmol/L   Potassium 4.7 3.5 - 5.1 mmol/L   Chloride 86 (L) 101 - 111 mmol/L   CO2 40 (H) 22 - 32 mmol/L   Glucose, Bld 94 65 - 99 mg/dL   BUN 17 6 - 20 mg/dL   Creatinine, Ser 0.99 0.61 - 1.24 mg/dL   Calcium 9.0 8.9 - 10.3 mg/dL   GFR calc non Af Amer >60 >60 mL/min   GFR calc Af Amer >60 >60 mL/min    Comment: (NOTE) The eGFR has been calculated using the CKD EPI equation. This calculation has not been validated in all clinical situations. eGFR's persistently <60 mL/min signify possible Chronic Kidney Disease.    Anion gap 8 5 - 15  Basic metabolic panel     Status: Abnormal   Collection Time: 11/30/16  5:07 AM  Result Value Ref Range   Sodium 133 (L) 135 - 145 mmol/L   Potassium 5.1 3.5 - 5.1 mmol/L   Chloride 87 (L) 101 - 111 mmol/L   CO2 39 (H) 22 - 32 mmol/L   Glucose, Bld 95 65 - 99 mg/dL   BUN 22 (H) 6 - 20 mg/dL   Creatinine, Ser 0.97 0.61 - 1.24 mg/dL   Calcium 9.0 8.9 - 10.3 mg/dL   GFR calc non Af Amer >60 >60 mL/min   GFR calc Af Amer >60 >60 mL/min    Comment: (NOTE) The eGFR has been calculated using the CKD EPI equation. This calculation has not been validated in all clinical situations. eGFR's persistently <60 mL/min signify possible Chronic Kidney Disease.    Anion gap 7 5 - 15    ABGS No results for input(s): PHART, PO2ART, TCO2, HCO3 in the last 72 hours.  Invalid input(s): PCO2 CULTURES Recent Results (from the past 240 hour(s))  MRSA PCR Screening     Status: None   Collection Time: 11/27/16  2:08 AM  Result Value Ref Range Status   MRSA by PCR NEGATIVE NEGATIVE Final    Comment:        The GeneXpert MRSA Assay (FDA approved for NASAL specimens only), is one component of a comprehensive MRSA colonization surveillance program. It is not intended to diagnose MRSA infection nor to guide  or monitor treatment for MRSA infections.    Studies/Results: No results found.  Medications:  Prior to Admission:  Prescriptions Prior to Admission  Medication Sig Dispense Refill Last Dose  . ELIQUIS 5 MG TABS tablet TAKE ONE TABLET TWICE DAILY 180 tablet 1 11/26/2016 at Unknown time  . furosemide (LASIX) 40 MG tablet TAKE ONE (1) TABLET EACH DAY 30 tablet 11 11/26/2016 at Unknown time  . HYDROcodone-acetaminophen (NORCO/VICODIN) 5-325 MG tablet One tablet every four hours as needed for acute pain.  Limit of five days per [place]. 30 tablet 0 Past Week at Unknown time  . metoprolol succinate (TOPROL-XL) 50 MG 24 hr tablet Take 1 tablet (50 mg total) by mouth daily. 90 tablet 3 unknown  . mirtazapine (REMERON) 30 MG tablet Take 30 mg by mouth at bedtime.    11/25/2016 at Unknown time  . tamsulosin (FLOMAX) 0.4 MG CAPS  capsule Take 0.4 mg by mouth every evening.    11/25/2016 at Unknown time  . vitamin B-12 (CYANOCOBALAMIN) 1000 MCG tablet Take 1,000 mcg by mouth daily.   11/26/2016 at Unknown time  . VITAMIN C, CALCIUM ASCORBATE, PO Take 1,000 mg by mouth daily.    11/26/2016 at Unknown time  . VITAMIN D, CHOLECALCIFEROL, PO Take 1,000 mg by mouth daily.    11/26/2016 at Unknown time   Scheduled: . apixaban  5 mg Oral BID  . digoxin  0.25 mg Oral Daily  . furosemide  40 mg Intravenous Daily  . guaiFENesin  1,200 mg Oral BID  . levalbuterol  0.63 mg Nebulization Q6H  . metoprolol succinate  150 mg Oral BID  . mirtazapine  30 mg Oral QHS  . nicotine  14 mg Transdermal Daily  . sodium chloride flush  3 mL Intravenous Q12H  . tamsulosin  0.4 mg Oral QPC supper  . vitamin B-12  1,000 mcg Oral Daily   Continuous: . sodium chloride     KCM:KLKJZP chloride, acetaminophen, ALPRAZolam, HYDROcodone-acetaminophen, ondansetron (ZOFRAN) IV, sodium chloride flush  Assesment: He was admitted with acute on chronic systolic heart failure associated with atrial fibrillation with rapid ventricular response. He has what looks like mucus plugging bilaterally but there is also a small infiltrate. I think we should try a conservative approach considering his age and multiple medical problems and go ahead and try to see if muco lytic skin help bronchodilators may help and then reassess after about 2 weeks. Principal Problem:   Acute on chronic systolic CHF (congestive heart failure) (HCC) Active Problems:   Atrial fibrillation with RVR (HCC)   AAA (abdominal aortic aneurysm) (HCC)   Bronchial obstruction   Hyponatremia   Pressure injury of skin    Plan: Continue current treatments    LOS: 4 days   Aahan Marques L 11/30/2016, 8:49 AM

## 2016-11-30 NOTE — Progress Notes (Signed)
PROGRESS NOTE                                                                                                                                                                                                             Patient Demographics:    Cody George, is a 83 y.o. male, DOB - 07/18/1930, HAL:[id number]  Admit date - 11/26/2016   Admitting Physician Vianne Bulls, MD  Outpatient Primary MD for the patient is Celene Squibb, MD  LOS - 4  Outpatient Specialists: Dr. Debara Pickett Dr. Caryl Comes Dr. Oneida Alar (vascular surgery)  Chief Complaint  Patient presents with  . Abdominal Pain       Brief Narrative  83 year old male with history of paroxysmalA. 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 He was recently diagnosed with have AAA an idea aneurysm incidentally on CT scan and informs being seen by vascular surgeon Dr. Oneida Alar as outpatient. In the ED he was tachypneic to the 30s and in rapid A. Fib with heart rate up to 150s. Chest x-ray showed findings of pulmonary edema. Blood work showed mild hyponatremia and mild cytopenia.BNP elevated to 1175. CT angiogram of the abdomen done for his abdominal distention showed fusiform infrarenal AAA and iliac aneurysms. Vascular surgery consulted by ED physician who recommended no acute intervention and outpatient follow-up. Patient admitted to stepdown unit for A. Fib with RVR and acute on chronic systolic CHF.    Subjective:   Feels breathing is improving. No chest pain. Reports of LE edema.   Assessment  & Plan :    Principal Problem:  Atrial  fibrillation with RVR ([place]) He has been weaned off of cardizem infusion. Heart rate still uncontrolled, despite increase dosing of toprol . Continue digoxin. Patient intolerant to amiodarone. Cardiology following and plans on further increase of toprol to 150mg  bid. Continue anticoagulation with apixiban.        Acute on chronic systolic CHF (congestive heart failure) (HCC) Diuresing quite well on IV Lasix (-9.6 L since admission). Continue strict I/O. 2-D echo with  worsening EF of 30%. Added ACE inhibitor. Currently on lasix IV 40mg  daily. He still has some volume overload. Continue current treatments.  Active Problems:      AAA (abdominal aortic aneurysm) (HCC) CT angiogram done in the ED shows infrarenal fusiform abdominal aortic aneurysm measuring 4.6 x 4.2 cm with the aneurysmal sac measuring 6.5 cm. Associated aneurysmal dilatation of bilateral common iliac artery with larger on the left measuring 3.2 cm.recommends follow-up CT in 6 months. Dr. Kasandra Knudsen was consulted by ED physician who recommended outpatient follow-up. Patient reports that he saw Dr. Oneida Alar recently recommended monitoring and outpatient follow-up.    Bronchial obstruction As seen on CT with findings of persistent endobronchial soft tissue versus mucus obstruction in bilateral lower lobes which could be either due to mucus plugging with atelectasis versus endobronchial lesion. patient does report chronic cough. He has been started on mucolytics and bronchodilators. Pulmonology following. Will likely need repeat follow up imaging in the next few weeks.   Liver lesion There is also a 7 mm indeterminate nodule in the left lower lobe of liver. Follow as an outpatient   Hyponatremia Mild. Possibly hypervolemic. Monitor with diuresis.    Pressure injury of skin Care per nursing.      Code Status : full code  Family Communication  : none at bedside  Disposition Plan  : home once improved  Barriers For Discharge :  active symptoms, uncontrolled heart rate  Consults  :  Cardiology, Pulmonology   Procedures  :  CT angiogram of the abdomen 2-D echo  DVT Prophylaxis  :  eliquis  Lab Results  Component Value Date   PLT 144 (L) 11/26/2016    Antibiotics  :   Anti-infectives    None        Objective:   Vitals:   11/30/16 0500 11/30/16 0600 11/30/16 0751 11/30/16 0842  BP: 113/75 97/60    Pulse: 92  (!) 102   Resp: (!) 21 (!) 21 14   Temp:   (!) 97.2 F (36.2 C)   TempSrc:   Oral   SpO2: 98%  97% 96%  Weight:      Height:        Wt Readings from Last 3 Encounters:  11/30/16 67.9 kg (149 lb 11.1 oz)  11/23/16 75.8 kg (167 lb)  11/04/16 74.4 kg (164 lb)     Intake/Output Summary (Last 24 hours) at 11/30/16 0910 Last data filed at 11/30/16 0458  Gross per 24 hour  Intake            28.58 ml  Output              975 ml  Net          -946.42 ml     Physical Exam Gen.: Elderly male not in distress, hard of hearing HEENT: Moist mucosa, supple neck Chest: clear bilaterally CVS: S1 and S2 irregularly irregular, no murmurs rub or gallop GI: Soft, nondistended, nontender Musculoskeletal: 1+ pitting edema bilaterally (mostly confined to lower tibia)      Data Review:    CBC  Recent Labs Lab 11/26/16 1659  WBC 9.9  HGB 14.2  HCT 41.8  PLT 144*  MCV 95.7  MCH 32.5  MCHC 34.0  RDW 15.0  LYMPHSABS 0.5*  MONOABS 1.2*  EOSABS 0.0  BASOSABS 0.0    Chemistries   Recent Labs Lab 11/26/16 1659 11/27/16 0454 11/28/16 0442 11/29/16 0510 11/30/16 0507  NA 133* 133* 135 134* 133*  K 4.3 3.9 4.6 4.7 5.1  CL 94* 90* 90* 86* 87*  CO2 30 33* 37* 40* 39*  GLUCOSE 119* 91 90 94 95  BUN 21* 18 18 17  22*  CREATININE 0.79 0.69 0.91 0.99 0.97  CALCIUM 9.2 8.9 8.8* 9.0 9.0  AST 24  --   --   --   --   ALT 26  --   --   --   --   ALKPHOS 59  --   --   --   --   BILITOT 1.2  --   --   --   --     ------------------------------------------------------------------------------------------------------------------ No results for input(s): CHOL, HDL, LDLCALC, TRIG, CHOLHDL, LDLDIRECT in the last 72 hours.  No results found for: HGBA1C ------------------------------------------------------------------------------------------------------------------ No results for input(s): TSH, T4TOTAL, T3FREE, THYROIDAB in the last 72 hours.  Invalid input(s): FREET3 ------------------------------------------------------------------------------------------------------------------ No results for input(s): VITAMINB12, FOLATE, FERRITIN, TIBC, IRON, RETICCTPCT in the last 72 hours.  Coagulation profile No results for input(s): INR, PROTIME in the last 168 hours.  No results for input(s): DDIMER in the last 72 hours.  Cardiac Enzymes  Recent Labs Lab 11/26/16 1659  TROPONINI <0.03   ------------------------------------------------------------------------------------------------------------------    Component Value Date/Time   BNP 1,175.0 (H) 11/26/2016 1659   BNP 392.5 (H) 11/04/2015 1208    Inpatient Medications  Scheduled Meds: . apixaban  5 mg Oral BID  . digoxin  0.25 mg Oral Daily  . furosemide  40 mg Intravenous Daily  . guaiFENesin  1,200 mg Oral BID  . levalbuterol  0.63 mg Nebulization Q6H  . metoprolol succinate  150 mg Oral BID  . mirtazapine  30 mg Oral QHS  . nicotine  14 mg Transdermal Daily  . sodium chloride flush  3 mL Intravenous Q12H  . tamsulosin  0.4 mg Oral QPC supper  . vitamin B-12  1,000 mcg Oral Daily   Continuous Infusions: . sodium chloride     PRN Meds:.sodium chloride, acetaminophen, ALPRAZolam, HYDROcodone-acetaminophen, ondansetron (ZOFRAN) IV, sodium chloride flush  Micro Results Recent Results (from the past 240 hour(s))  MRSA PCR Screening     Status: None   Collection Time: 11/27/16  2:08 AM  Result Value Ref Range Status   MRSA by PCR  NEGATIVE NEGATIVE Final    Comment:        The GeneXpert MRSA Assay (FDA approved for NASAL specimens only), is one component of a comprehensive MRSA colonization surveillance program. It is not intended to diagnose MRSA infection nor to guide or monitor treatment for MRSA infections.     Radiology Reports Dg Chest 2 View  Result Date: 11/26/2016 CLINICAL DATA:  Dyspnea EXAM: CHEST  2 VIEW COMPARISON:  11/04/2016 FINDINGS: Diffuse interstitial coarsening, new. This could represent edema or infection. No confluent alveolar consolidation. No pleural effusions. Unremarkable hilar and mediastinal contours. Borderline heart size, unchanged. IMPRESSION: New diffuse coarsening of the interstitium. This could be due to infection or edema. No confluent consolidation. Electronically Signed   By: Andreas Newport M.D.   On: 11/26/2016 18:27   Ct Angio Chest Pe W And/or Wo Contrast  Result Date: 11/04/2016 CLINICAL DATA:  Shortness of breath since yesterday.  Smoker. EXAM: CT ANGIOGRAPHY CHEST WITH CONTRAST TECHNIQUE: Multidetector CT imaging of the chest was performed using the standard protocol during bolus administration of intravenous contrast. Multiplanar CT image reconstructions and MIPs were obtained to evaluate the vascular anatomy. CONTRAST:  100 cc Isovue 370 COMPARISON:  Portable chest obtained earlier  today. Chest CT dated 04/14/2014. FINDINGS: Cardiovascular: Several small, linear filling defects in the right lower lobe pulmonary arteries. These are nonocclusive. Atheromatous arterial calcifications, including the coronary arteries and aorta. Mediastinum/Nodes: No enlarged mediastinal, hilar, or axillary lymph nodes. Thyroid gland, trachea, and esophagus demonstrate no significant findings. Lungs/Pleura: Posteromedial right lower lobe endobronchial occluding soft tissue and atelectasis. The endobronchial soft tissue density is not unchanged. Small left pleural effusion. The lungs are mildly  hyperexpanded. Upper Abdomen: Reflux of contrast into the inferior vena cava and hepatic veins. Musculoskeletal: Thoracic and lower cervical spine degenerative changes. Review of the MIP images confirms the above findings. IMPRESSION: 1. Several small, linear right lower lobe pulmonary arterial filling defects. These are nonocclusive and are compatible with subacute emboli. No definite acute pulmonary emboli seen. 2. Chronic soft tissue density filling a right lower lobe bronchus with associated atelectasis. The chronicity is compatible with a chronic mucous plug or scarring rather than a neoplastic process. 3. Small left pleural effusion. 4. Mild changes of COPD. 5. Calcified coronary artery and aortic atherosclerosis. Aortic Atherosclerosis (ICD10-I70.0) and Emphysema (ICD10-J43.9). Electronically Signed   By: Claudie Revering M.D.   On: 11/04/2016 19:52   Dg Chest Portable 1 View  Result Date: 11/04/2016 CLINICAL DATA:  Shortness of breath. EXAM: PORTABLE CHEST 1 VIEW COMPARISON:  Chest x-ray dated November 04, 2015. FINDINGS: The cardiomediastinal silhouette remains mildly enlarged. Unchanged left chest wall AICD. Atherosclerotic calcification of the aortic arch. Mild pulmonary vascular congestion. No pleural effusion or pneumothorax. Bibasilar atelectasis. No acute osseous abnormality. IMPRESSION: Cardiomegaly and mild pulmonary vascular congestion without overt pulmonary edema. Electronically Signed   By: Titus Dubin M.D.   On: 11/04/2016 17:51   Ct Angio Abd/pel W And/or Wo Contrast  Result Date: 11/26/2016 CLINICAL DATA:  Generalized weakness. Abdominal pain with radiation to bilateral flanks. Known abdominal aortic aneurysm. EXAM: CTA ABDOMEN AND PELVIS wITHOUT AND WITH CONTRAST TECHNIQUE: Multidetector CT imaging of the abdomen and pelvis was performed using the standard protocol during bolus administration of intravenous contrast. Multiplanar reconstructed images and MIPs were obtained and  reviewed to evaluate the vascular anatomy. CONTRAST:  100 cc Isovue 370 intravenously. COMPARISON:  None. FINDINGS: VASCULAR Aorta: Atherosclerotic disease of the aorta with calcified and noncalcified plaque. Ectatic aorta. Infra renal fusiform abdominal aortic aneurysm measuring 4.6 by 4.2 cm. The aneurysmal sac measures 6.5 cm in craniocaudal direction. Crescent-shaped hypoattenuated area along the left wall of the aorta likely represents an intramural thrombus. There is an associated aneurysmal dilation of the bilateral common iliac artery is, the larger on the left measures 3.2 cm. Celiac: Patent without evidence of aneurysm, dissection, vasculitis or significant stenosis. SMA: Patent without evidence of aneurysm, dissection, vasculitis or significant stenosis. Renals: Both renal arteries are patent without evidence of aneurysm, dissection, vasculitis, fibromuscular dysplasia or significant stenosis. IMA: Patent without evidence of aneurysm, dissection, vasculitis or significant stenosis. Arises from the anterior portion of the aneurysmal aorta. Inflow: Patent without evidence of aneurysm, dissection, vasculitis or significant stenosis. Proximal Outflow: Bilateral common femoral and visualized portions of the superficial and profunda femoral arteries are patent without evidence of aneurysm, dissection, vasculitis or significant stenosis. Veins: No obvious venous abnormality within the limitations of this arterial phase study. Review of the MIP images confirms the above findings. NON-VASCULAR Lower chest: Soft tissue impaction of the segmental bronchi to the basilar segments of bilateral lower lobes. Persistent right lower lobe posteromedial endobronchial soft tissue with surrounding atelectasis. Hepatobiliary: Indeterminate 7 mm left lower lobe hypoattenuated  lesion. Small amount of pericholecystic fluid. Pancreas: Unremarkable. No pancreatic ductal dilatation or surrounding inflammatory changes. Spleen: Normal  in size without focal abnormality. Adrenals/Urinary Tract: Adrenal glands are unremarkable. Kidneys are without renal calculi, suspicious focal lesion, or hydronephrosis. Right renal cysts. Mild symmetric mucosal thickening of the urinary bladder. Stomach/Bowel: Stomach is within normal limits. No evidence of bowel wall thickening, distention, or inflammatory changes. Lymphatic: No evidence of lymphadenopathy. Reproductive: Enlarged prostate gland. Radiation seeds versus surgical clips within the prostate gland. Other: Chest wall edema. Moderate amount of abdominopelvic ascites with water density. Musculoskeletal: Multilevel osteoarthritic changes of the lumbosacral spine. IMPRESSION: VASCULAR Fusiform infrarenal abdominal aortic aneurysm, measuring 4.6 cm in greatest transverse dimension. Crescent-shaped hypoattenuation within the aneurysmal sac along the lateral aortic wall likely represents intramural thrombus. Recommend followup by abdomen and pelvis CTA in 6 months, and vascular surgery referral/consultation if not already obtained. This recommendation follows ACR consensus guidelines: White Paper of the ACR Incidental Findings Committee II on Vascular Findings. J Am Coll Radiol 2013; 10:789-794. Extension of the aneurysmal dilation to the bilateral common iliac arteries, left greater than right, with maximum diameter of 3.2 cm. Atherosclerotic disease of the aorta in its abdominal attributes. The named abdominal arterial branches however are patent. NON-VASCULAR Water density abdominopelvic ascites and chest wall edema. Pericholecystic fluid. It is difficult to determine whether this represent inflammatory changes associated with the gallbladder or is due to presence of ascites. Indeterminate 7 mm hypoattenuated nodule in the left lobe of the liver. Enlargement of the prostate gland with mild mucosal thickening of the urinary bladder, which may be due to ongoing outlet obstruction. Persistent endobronchial  soft tissue versus mucous obstruction in bilateral lower lobes with soft tissue peribronchial density in the medial posterior aspect of the right lower lobe. This finding may represent area of atelectasis, due to mucous plugging, however pulmonary malignancy with associated endobronchial lesion cannot be excluded. Electronically Signed   By: Fidela Salisbury M.D.   On: 11/26/2016 20:15    Time Spent in minutes  19   MEMON,JEHANZEB M.D on 11/30/2016 at 9:10 AM  Between 7am to 7pm - Pager - [phone]  After 7pm go to [email or web address] - password [facility]  [facility] -  Office  [phone]

## 2016-11-30 NOTE — Care Management (Addendum)
Patient is now agreeable to Cody George calls, although he still has some reluctance.  He is not home bound, so he does not qualify for home health. Patient is ind, walked 75 feet without assistive devices. He still drives and is active in the community.

## 2016-11-30 NOTE — Plan of Care (Signed)
Problem: Safety: Goal: Ability to remain free from injury will improve Outcome: Progressing Patient has remained free from injury. Bed in lowest position. Call light within reach. Belongings within reach. Adequate lighting. Bed alarm on.

## 2016-12-01 DIAGNOSIS — I481 Persistent atrial fibrillation: Secondary | ICD-10-CM

## 2016-12-01 LAB — BASIC METABOLIC PANEL
Anion gap: 10 (ref 5–15)
BUN: 21 mg/dL — AB (ref 6–20)
CALCIUM: 8.8 mg/dL — AB (ref 8.9–10.3)
CHLORIDE: 86 mmol/L — AB (ref 101–111)
CO2: 36 mmol/L — AB (ref 22–32)
CREATININE: 0.75 mg/dL (ref 0.61–1.24)
GFR calc non Af Amer: >60 mL/min (ref >60)
Glucose, Bld: 79 mg/dL (ref 65–99)
Potassium: 4.8 mmol/L (ref 3.5–5.1)
SODIUM: 132 mmol/L — AB (ref 135–145)

## 2016-12-01 MED ORDER — GUAIFENESIN ER 600 MG PO TB12: 600.0000 mg | ORAL_TABLET | Freq: Two times a day (BID) | ORAL | 0 refills | Status: AC

## 2016-12-01 MED ORDER — DIGOXIN 250 MCG PO TABS: 0.2500 mg | ORAL_TABLET | Freq: Every day | ORAL | 0 refills | Status: DC

## 2016-12-01 MED ORDER — METOPROLOL SUCCINATE ER 50 MG PO TB24: 150.0000 mg | ORAL_TABLET | Freq: Two times a day (BID) | ORAL | 3 refills | Status: DC

## 2016-12-01 NOTE — Progress Notes (Signed)
He says he feels better. He has no new complaints. He says his breathing is doing okay. He is coughing up some thick fairly clear sputum. His lungs are clear now. He is still in atrial fib.  He is improving. Agree that he will need outpatient imaging and should probably have CT in about a month. I will plan to follow more peripherally.

## 2016-12-01 NOTE — Progress Notes (Signed)
Cody George discharged Home per MD order.  Discharge instructions reviewed and discussed with the patient, all questions and concerns answered. Copy of instructions and scripts given to patient.  Allergies as of 12/01/2016   No Known Allergies     Medication List    TAKE these medications   digoxin 0.25 MG tablet Commonly known as:  LANOXIN Take 1 tablet (0.25 mg total) by mouth daily.   ELIQUIS 5 MG Tabs tablet Generic drug:  apixaban TAKE ONE TABLET TWICE DAILY   furosemide 40 MG tablet Commonly known as:  LASIX TAKE ONE (1) TABLET EACH DAY   guaiFENesin 600 MG 12 hr tablet Commonly known as:  MUCINEX Take 1 tablet (600 mg total) by mouth 2 (two) times daily.   HYDROcodone-acetaminophen 5-325 MG tablet Commonly known as:  NORCO/VICODIN One tablet every four hours as needed for acute pain.  Limit of five days per [place].   metoprolol succinate 50 MG 24 hr tablet Commonly known as:  TOPROL-XL Take 3 tablets (150 mg total) by mouth 2 (two) times daily. What changed:  how much to take  when to take this   mirtazapine 30 MG tablet Commonly known as:  REMERON Take 30 mg by mouth at bedtime.   tamsulosin 0.4 MG Caps capsule Commonly known as:  FLOMAX Take 0.4 mg by mouth every evening.   vitamin B-12 1000 MCG tablet Commonly known as:  CYANOCOBALAMIN Take 1,000 mcg by mouth daily.   VITAMIN C (CALCIUM ASCORBATE) PO Take 1,000 mg by mouth daily.   VITAMIN D (CHOLECALCIFEROL) PO Take 1,000 mg by mouth daily.       Patients skin is clean, dry and intact, no evidence of skin break down. IV site discontinued and catheter remains intact. Site without signs and symptoms of complications. Dressing and pressure applied.  Patient escorted to car in a wheelchair,  no distress noted upon discharge.  Cody George 12/01/2016 4:12 PM

## 2016-12-01 NOTE — Discharge Summary (Addendum)
Physician Discharge Summary  Cody George QMG:[id number] DOB: Mar 30, 1930 DOA: 11/26/2016  PCP: Celene Squibb, MD  Admit date: 11/26/2016 Discharge date: 12/01/2016  Admitted From: home Disposition:  home  Recommendations for Outpatient Follow-up:  1. Follow up with PCP in 1-2 weeks 2. Please obtain BMP/CBC in one week 3. Follow up with cardiology to reassess atrial fibrillation 4. Follow up with pulmonology as previously scheduled 5. Follow up with vascular surgery scheduled for AAA  Discharge Condition: stable CODE STATUS: full code Diet recommendation: Heart Healthy  Brief/Interim Summary: 83 year old male with history of paroxysmalA. 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 He was recently diagnosed with have AAA an idea aneurysm incidentally on CT scan and informs being seen by vascular surgeon Dr. Oneida Alar as outpatient. In the ED he was tachypneic to the 30s and in rapid A. Fib with heart rate up to 150s. Chest x-ray showed findings of pulmonary edema. Blood work showed mild hyponatremia and mild cytopenia.BNP elevated to 1175. CT angiogram of the abdomen done for his abdominal distention showed fusiform infrarenal AAA and iliac aneurysms. Vascular surgery consulted by ED physician who recommended no acute intervention and outpatient follow-up. Patient admitted to stepdown unit for A. Fib with RVR and acute on chronic systolic CHF.  Discharge Diagnoses:  Principal Problem:   Acute on chronic systolic CHF (congestive heart failure) (HCC) Active Problems:   Atrial fibrillation with RVR (HCC)   AAA (abdominal aortic aneurysm) (HCC)    Bronchial obstruction   Hyponatremia   Pressure injury of skin   Atrial fibrillation with RVR ([place]) Patient was initially treated with cardizem infusion and was eventually weaned off. He was also started on digoxin. Toprol was increased to 150 mg twice a day . Cardiology was following. Heart rate is better controlled. Plan is to continue current regimen and follow-up with cardiology as an outpatient. Continue anticoagulation with apixiban.   Acute on chronic systolic CHF (congestive heart failure) (HCC) Diuresed quite well on IV Lasix (-12.2 L since admission). 2-D echo with worsening EF of 30%. Added ACE inhibitor. Appears to be euvolemic. Lasix transitioned to 40 mg daily.  AAA (abdominal aortic aneurysm) ([place]) CT angiogram done in the ED shows infrarenal fusiform abdominal aortic aneurysm measuring 4.6 x 4.2 cm with the aneurysmal sac measuring 6.5 cm. Associated aneurysmal dilatation of bilateral common iliac artery with larger on the left measuring 3.2 cm.recommends follow-up CT in 6 months. Dr. Donzetta Matters was consulted by ED physician who recommended outpatient follow-up. Patient reports that he saw Dr. Oneida Alar recently recommended monitoring and outpatient follow-up.    Bronchial obstruction As seen on CT chest with findings of persistent endobronchial soft tissue versus mucus obstruction in bilateral lower lobes which could be either due to mucus plugging with atelectasis versus endobronchial lesion. Patient did report chronic cough. He was started on mucolytics and bronchodilators. Pulmonology following. Overall breathing has improved. He has been producing thick sputum. He has outpatient pulmonology scheduled will likely need repeat imaging in the next 1 month..   Liver lesion There is also a 7 mm indeterminate nodule in the left lower lobe of liver. Follow as an outpatient   Hyponatremia Mild. Possibly hypervolemic. Patient is  asymptomatic    Pressure injury of skin Sacral  pressure injury, stage I, present on admission, treated with foam per nursing.  Discharge Instructions  Discharge Instructions    Diet - low sodium heart healthy    Complete by:  As directed    Increase activity slowly    Complete by:  As directed      Allergies as of 12/01/2016   No Known Allergies     Medication List    TAKE these medications   digoxin 0.25 MG tablet Commonly known as:  LANOXIN Take 1 tablet (0.25 mg total) by mouth daily.   ELIQUIS 5 MG Tabs tablet Generic drug:  apixaban TAKE ONE TABLET TWICE DAILY   furosemide 40 MG tablet Commonly known as:  LASIX TAKE ONE (1) TABLET EACH DAY   guaiFENesin 600 MG 12 hr tablet Commonly known as:  MUCINEX Take 1 tablet (600 mg total) by mouth 2 (two) times daily.   HYDROcodone-acetaminophen 5-325 MG tablet Commonly known as:  NORCO/VICODIN One tablet every four hours as needed for acute pain.  Limit of five days per [place].   metoprolol succinate 50 MG 24 hr tablet Commonly known as:  TOPROL-XL Take 3 tablets (150 mg total) by mouth 2 (two) times daily. What changed:  how much to take  when to take this   mirtazapine 30 MG tablet Commonly known as:  REMERON Take 30 mg by mouth at bedtime.   tamsulosin 0.4 MG Caps capsule Commonly known as:  FLOMAX Take 0.4 mg by mouth every evening.   vitamin B-12 1000 MCG tablet Commonly known as:  CYANOCOBALAMIN Take 1,000 mcg by mouth daily.   VITAMIN C (CALCIUM ASCORBATE) PO Take 1,000 mg by mouth daily.   VITAMIN D (CHOLECALCIFEROL) PO Take 1,000 mg by mouth daily.       No Known Allergies  Consultations:  Cardiology  pulmonology   Procedures/Studies: Dg Chest 2 View  Result Date: 11/26/2016 CLINICAL DATA:  Dyspnea EXAM: CHEST  2 VIEW COMPARISON:  11/04/2016 FINDINGS: Diffuse interstitial coarsening, new. This could represent edema or infection. No confluent alveolar consolidation. No pleural effusions. Unremarkable hilar and  mediastinal contours. Borderline heart size, unchanged. IMPRESSION: New diffuse coarsening of the interstitium. This could be due to infection or edema. No confluent consolidation. Electronically Signed   By: Andreas Newport M.D.   On: 11/26/2016 18:27   Ct Angio Chest Pe W And/or Wo Contrast  Result Date: 11/04/2016 CLINICAL DATA:  Shortness of breath since yesterday.  Smoker. EXAM: CT ANGIOGRAPHY CHEST WITH CONTRAST TECHNIQUE: Multidetector CT imaging of the chest was performed using the standard protocol during bolus administration of intravenous contrast. Multiplanar CT image reconstructions and MIPs were obtained to evaluate the vascular anatomy. CONTRAST:  100 cc Isovue 370 COMPARISON:  Portable chest obtained earlier today. Chest CT dated 04/14/2014. FINDINGS: Cardiovascular: Several small, linear filling defects in the right lower lobe pulmonary arteries. These are nonocclusive. Atheromatous arterial calcifications, including the coronary arteries and aorta. Mediastinum/Nodes: No enlarged mediastinal, hilar, or axillary lymph nodes. Thyroid gland, trachea, and esophagus demonstrate no significant findings. Lungs/Pleura: Posteromedial right lower lobe endobronchial occluding soft tissue and atelectasis. The endobronchial soft tissue density is not unchanged. Small left pleural effusion. The lungs are mildly hyperexpanded. Upper Abdomen: Reflux of contrast into the inferior vena cava and hepatic veins. Musculoskeletal: Thoracic and lower cervical spine degenerative changes. Review of the MIP images confirms the above findings. IMPRESSION: 1. Several small, linear right lower lobe pulmonary arterial  filling defects. These are nonocclusive and are compatible with subacute emboli. No definite acute pulmonary emboli seen. 2. Chronic soft tissue density filling a right lower lobe bronchus with associated atelectasis. The chronicity is compatible with a chronic mucous plug or scarring rather than a neoplastic  process. 3. Small left pleural effusion. 4. Mild changes of COPD. 5. Calcified coronary artery and aortic atherosclerosis. Aortic Atherosclerosis (ICD10-I70.0) and Emphysema (ICD10-J43.9). Electronically Signed   By: Claudie Revering M.D.   On: 11/04/2016 19:52   Dg Chest Portable 1 View  Result Date: 11/04/2016 CLINICAL DATA:  Shortness of breath. EXAM: PORTABLE CHEST 1 VIEW COMPARISON:  Chest x-ray dated November 04, 2015. FINDINGS: The cardiomediastinal silhouette remains mildly enlarged. Unchanged left chest wall AICD. Atherosclerotic calcification of the aortic arch. Mild pulmonary vascular congestion. No pleural effusion or pneumothorax. Bibasilar atelectasis. No acute osseous abnormality. IMPRESSION: Cardiomegaly and mild pulmonary vascular congestion without overt pulmonary edema. Electronically Signed   By: Titus Dubin M.D.   On: 11/04/2016 17:51   Ct Angio Abd/pel W And/or Wo Contrast  Result Date: 11/26/2016 CLINICAL DATA:  Generalized weakness. Abdominal pain with radiation to bilateral flanks. Known abdominal aortic aneurysm. EXAM: CTA ABDOMEN AND PELVIS wITHOUT AND WITH CONTRAST TECHNIQUE: Multidetector CT imaging of the abdomen and pelvis was performed using the standard protocol during bolus administration of intravenous contrast. Multiplanar reconstructed images and MIPs were obtained and reviewed to evaluate the vascular anatomy. CONTRAST:  100 cc Isovue 370 intravenously. COMPARISON:  None. FINDINGS: VASCULAR Aorta: Atherosclerotic disease of the aorta with calcified and noncalcified plaque. Ectatic aorta. Infra renal fusiform abdominal aortic aneurysm measuring 4.6 by 4.2 cm. The aneurysmal sac measures 6.5 cm in craniocaudal direction. Crescent-shaped hypoattenuated area along the left wall of the aorta likely represents an intramural thrombus. There is an associated aneurysmal dilation of the bilateral common iliac artery is, the larger on the left measures 3.2 cm. Celiac: Patent  without evidence of aneurysm, dissection, vasculitis or significant stenosis. SMA: Patent without evidence of aneurysm, dissection, vasculitis or significant stenosis. Renals: Both renal arteries are patent without evidence of aneurysm, dissection, vasculitis, fibromuscular dysplasia or significant stenosis. IMA: Patent without evidence of aneurysm, dissection, vasculitis or significant stenosis. Arises from the anterior portion of the aneurysmal aorta. Inflow: Patent without evidence of aneurysm, dissection, vasculitis or significant stenosis. Proximal Outflow: Bilateral common femoral and visualized portions of the superficial and profunda femoral arteries are patent without evidence of aneurysm, dissection, vasculitis or significant stenosis. Veins: No obvious venous abnormality within the limitations of this arterial phase study. Review of the MIP images confirms the above findings. NON-VASCULAR Lower chest: Soft tissue impaction of the segmental bronchi to the basilar segments of bilateral lower lobes. Persistent right lower lobe posteromedial endobronchial soft tissue with surrounding atelectasis. Hepatobiliary: Indeterminate 7 mm left lower lobe hypoattenuated lesion. Small amount of pericholecystic fluid. Pancreas: Unremarkable. No pancreatic ductal dilatation or surrounding inflammatory changes. Spleen: Normal in size without focal abnormality. Adrenals/Urinary Tract: Adrenal glands are unremarkable. Kidneys are without renal calculi, suspicious focal lesion, or hydronephrosis. Right renal cysts. Mild symmetric mucosal thickening of the urinary bladder. Stomach/Bowel: Stomach is within normal limits. No evidence of bowel wall thickening, distention, or inflammatory changes. Lymphatic: No evidence of lymphadenopathy. Reproductive: Enlarged prostate gland. Radiation seeds versus surgical clips within the prostate gland. Other: Chest wall edema. Moderate amount of abdominopelvic ascites with water density.  Musculoskeletal: Multilevel osteoarthritic changes of the lumbosacral spine. IMPRESSION: VASCULAR Fusiform infrarenal abdominal aortic aneurysm, measuring 4.6 cm in greatest  transverse dimension. Crescent-shaped hypoattenuation within the aneurysmal sac along the lateral aortic wall likely represents intramural thrombus. Recommend followup by abdomen and pelvis CTA in 6 months, and vascular surgery referral/consultation if not already obtained. This recommendation follows ACR consensus guidelines: White Paper of the ACR Incidental Findings Committee II on Vascular Findings. J Am Coll Radiol 2013; 10:789-794. Extension of the aneurysmal dilation to the bilateral common iliac arteries, left greater than right, with maximum diameter of 3.2 cm. Atherosclerotic disease of the aorta in its abdominal attributes. The named abdominal arterial branches however are patent. NON-VASCULAR Water density abdominopelvic ascites and chest wall edema. Pericholecystic fluid. It is difficult to determine whether this represent inflammatory changes associated with the gallbladder or is due to presence of ascites. Indeterminate 7 mm hypoattenuated nodule in the left lobe of the liver. Enlargement of the prostate gland with mild mucosal thickening of the urinary bladder, which may be due to ongoing outlet obstruction. Persistent endobronchial soft tissue versus mucous obstruction in bilateral lower lobes with soft tissue peribronchial density in the medial posterior aspect of the right lower lobe. This finding may represent area of atelectasis, due to mucous plugging, however pulmonary malignancy with associated endobronchial lesion cannot be excluded. Electronically Signed   By: Fidela Salisbury M.D.   On: 11/26/2016 20:15    Echo: - Mild LVH with LVEF approximately 30% in the setting of atrial   fibrillation. There is diffuse hypokinesis with regional   variation. Indeterminate diastolic function. Moderate left atrial    enlargement. Mildly calcified mitral annulus with mild mitral   regurgitation. Moderate calcific aortic stenosis with moderate   aortic regurgitation. Device wire present within the right heart.   Mildly dilated right atrium. Mild to moderate tricuspid   regurgitation with estimated PASP 43 mmHg.   Subjective: No shortness of breath or palpitations  Discharge Exam: Vitals:   12/01/16 0528 12/01/16 0738  BP: 115/69   Pulse: 90   Resp: (!) 22   Temp: 97.6 F (36.4 C)   SpO2: 99% 96%   Vitals:   11/30/16 2021 11/30/16 2201 12/01/16 0528 12/01/16 0738  BP:  102/74 115/69   Pulse:  89 90   Resp:  16 (!) 22   Temp:  97.8 F (36.6 C) 97.6 F (36.4 C)   TempSrc:  Oral Oral   SpO2: 93% 97% 99% 96%  Weight:      Height:        General: Pt is alert, awake, not in acute distress Cardiovascular: irregular, S1/S2 +, no rubs, no gallops Respiratory: CTA bilaterally, no wheezing, no rhonchi Abdominal: Soft, NT, ND, bowel sounds + Extremities: no edema, no cyanosis    The results of significant diagnostics from this hospitalization (including imaging, microbiology, ancillary and laboratory) are listed below for reference.     Microbiology: Recent Results (from the past 240 hour(s))  MRSA PCR Screening     Status: None   Collection Time: 11/27/16  2:08 AM  Result Value Ref Range Status   MRSA by PCR NEGATIVE NEGATIVE Final    Comment:        The GeneXpert MRSA Assay (FDA approved for NASAL specimens only), is one component of a comprehensive MRSA colonization surveillance program. It is not intended to diagnose MRSA infection nor to guide or monitor treatment for MRSA infections.      Labs: BNP (last 3 results)  Recent Labs  11/04/16 1718 11/26/16 1659  BNP 1,343.0* 2,683.4*   Basic Metabolic Panel:  Recent Labs  Lab 11/27/16 0454 11/28/16 0442 11/29/16 0510 11/30/16 0507 12/01/16 0545  NA 133* 135 134* 133* 132*  K 3.9 4.6 4.7 5.1 4.8  CL 90* 90* 86*  87* 86*  CO2 33* 37* 40* 39* 36*  GLUCOSE 91 90 94 95 79  BUN 18 18 17  22* 21*  CREATININE 0.69 0.91 0.99 0.97 0.75  CALCIUM 8.9 8.8* 9.0 9.0 8.8*   Liver Function Tests:  Recent Labs Lab 11/26/16 1659  AST 24  ALT 26  ALKPHOS 59  BILITOT 1.2  PROT 6.1*  ALBUMIN 4.0   No results for input(s): LIPASE, AMYLASE in the last 168 hours. No results for input(s): AMMONIA in the last 168 hours. CBC:  Recent Labs Lab 11/26/16 1659  WBC 9.9  NEUTROABS 8.2*  HGB 14.2  HCT 41.8  MCV 95.7  PLT 144*   Cardiac Enzymes:  Recent Labs Lab 11/26/16 1659  TROPONINI <0.03   BNP: Invalid input(s): POCBNP CBG: No results for input(s): GLUCAP in the last 168 hours. D-Dimer No results for input(s): DDIMER in the last 72 hours. Hgb A1c No results for input(s): HGBA1C in the last 72 hours. Lipid Profile No results for input(s): CHOL, HDL, LDLCALC, TRIG, CHOLHDL, LDLDIRECT in the last 72 hours. Thyroid function studies No results for input(s): TSH, T4TOTAL, T3FREE, THYROIDAB in the last 72 hours.  Invalid input(s): FREET3 Anemia work up No results for input(s): VITAMINB12, FOLATE, FERRITIN, TIBC, IRON, RETICCTPCT in the last 72 hours. Urinalysis    Component Value Date/Time   COLORURINE YELLOW 11/26/2016 1706   APPEARANCEUR CLEAR 11/26/2016 1706   LABSPEC 1.010 11/26/2016 1706   PHURINE 7.0 11/26/2016 1706   GLUCOSEU NEGATIVE 11/26/2016 1706   HGBUR NEGATIVE 11/26/2016 1706   BILIRUBINUR NEGATIVE 11/26/2016 1706   KETONESUR NEGATIVE 11/26/2016 1706   PROTEINUR NEGATIVE 11/26/2016 1706   UROBILINOGEN 1.0 04/14/2014 1645   NITRITE NEGATIVE 11/26/2016 1706   LEUKOCYTESUR NEGATIVE 11/26/2016 1706   Sepsis Labs Invalid input(s): PROCALCITONIN,  WBC,  LACTICIDVEN Microbiology Recent Results (from the past 240 hour(s))  MRSA PCR Screening     Status: None   Collection Time: 11/27/16  2:08 AM  Result Value Ref Range Status   MRSA by PCR NEGATIVE NEGATIVE Final    Comment:         The GeneXpert MRSA Assay (FDA approved for NASAL specimens only), is one component of a comprehensive MRSA colonization surveillance program. It is not intended to diagnose MRSA infection nor to guide or monitor treatment for MRSA infections.      Time coordinating discharge: Over 30 minutes  SIGNED:   Kathie Dike, MD  [facility] 12/01/2016, 1:50 PM Pager   If 7PM-7AM, please contact night-coverage [email or web address] Password [email or web address]

## 2016-12-01 NOTE — Progress Notes (Signed)
Progress Note  Patient Name: Cody George Date of Encounter: 12/01/2016  Primary Cardiologist: Dr. Lyman Bishop  Subjective   Feels better, less short of breath. No palpitations. He ambulated in the hall yesterday.  Inpatient Medications    Scheduled Meds: . apixaban  5 mg Oral BID  . digoxin  0.25 mg Oral Daily  . furosemide  40 mg Intravenous Daily  . guaiFENesin  1,200 mg Oral BID  . levalbuterol  0.63 mg Nebulization TID  . metoprolol succinate  150 mg Oral BID  . mirtazapine  30 mg Oral QHS  . nicotine  14 mg Transdermal Daily  . sodium chloride flush  3 mL Intravenous Q12H  . tamsulosin  0.4 mg Oral QPC supper  . vitamin B-12  1,000 mcg Oral Daily   Continuous Infusions: . sodium chloride     PRN Meds: sodium chloride, acetaminophen, ALPRAZolam, HYDROcodone-acetaminophen, ondansetron (ZOFRAN) IV, sodium chloride flush   Vital Signs    Vitals:   11/30/16 2021 11/30/16 2201 12/01/16 0528 12/01/16 0738  BP:  102/74 115/69   Pulse:  89 90   Resp:  16 (!) 22   Temp:  97.8 F (36.6 C) 97.6 F (36.4 C)   TempSrc:  Oral Oral   SpO2: 93% 97% 99% 96%  Weight:      Height:        Intake/Output Summary (Last 24 hours) at 12/01/16 0856 Last data filed at 12/01/16 0600  Gross per 24 hour  Intake              240 ml  Output             2800 ml  Net            -2560 ml   Filed Weights   11/28/16 0500 11/29/16 0323 11/30/16 0458  Weight: 155 lb 6.8 oz (70.5 kg) 151 lb 0.2 oz (68.5 kg) 149 lb 11.1 oz (67.9 kg)    Telemetry    Atrial fibrillation. Personally reviewed.  Physical Exam   GEN: Elderly male. No acute distress.   Neck: No JVD. Cardiac:  Irregularly irregular, soft murmur, rub, or gallop.  Respiratory: Nonlabored. No wheezing. GI: Soft, nontender, bowel sounds present. MS:  Mild right lower leg edema; No deformity. Neuro:  Nonfocal. Psych: Alert and oriented x 3. Normal affect.  Labs    Chemistry Recent Labs Lab 11/26/16 1659   11/29/16 0510 11/30/16 0507 12/01/16 0545  NA 133*  < > 134* 133* 132*  K 4.3  < > 4.7 5.1 4.8  CL 94*  < > 86* 87* 86*  CO2 30  < > 40* 39* 36*  GLUCOSE 119*  < > 94 95 79  BUN 21*  < > 17 22* 21*  CREATININE 0.79  < > 0.99 0.97 0.75  CALCIUM 9.2  < > 9.0 9.0 8.8*  PROT 6.1*  --   --   --   --   ALBUMIN 4.0  --   --   --   --   AST 24  --   --   --   --   ALT 26  --   --   --   --   ALKPHOS 59  --   --   --   --   BILITOT 1.2  --   --   --   --   GFRNONAA >60  < > >60 >60 >60  GFRAA >60  < > >60 >  60 >60  ANIONGAP 9  < > 8 7 10   < > = values in this interval not displayed.   Hematology Recent Labs Lab 11/26/16 1659  WBC 9.9  RBC 4.37  HGB 14.2  HCT 41.8  MCV 95.7  MCH 32.5  MCHC 34.0  RDW 15.0  PLT 144*    Cardiac Enzymes Recent Labs Lab 11/26/16 1659  TROPONINI <0.03   No results for input(s): TROPIPOC in the last 168 hours.   BNP Recent Labs Lab 11/26/16 1659  BNP 1,175.0*     Radiology    No results found.  Cardiac Studies   Echocardiogram 11/28/2016: Study Conclusions  - Left ventricle: The cavity size was normal. Wall thickness was increased in a pattern of mild LVH. The estimated ejection fraction was 30%. Diffuse hypokinesis with regional variation. - Aortic valve: Trileaflet; moderately calcified leaflets. There was moderate stenosis. There was moderate regurgitation. Mean gradient (S): 11 mm Hg. Peak gradient (S): 20 mm Hg. VTI ratio of LVOT to aortic valve: 0.38. Valve area (VTI): 1.2 cm^2. - Mitral valve: Mildly calcified annulus. There was mild regurgitation. - Left atrium: The atrium was moderately dilated. - Right ventricle: Pacer wire or catheter noted in right ventricle. - Right atrium: The atrium was mildly dilated. Central venous pressure (est): 8 mm Hg. - Atrial septum: No defect or patent foramen ovale was identified. - Tricuspid valve: There was mild-moderate regurgitation. - Pulmonary arteries: PA peak  pressure: 43 mm Hg (S). - Pericardium, extracardiac: There was no pericardial effusion.  Impressions:  - Mild LVH with LVEF approximately 30% in the setting of atrial fibrillation. There is diffuse hypokinesis with regional variation. Indeterminate diastolic function. Moderate left atrial enlargement. Mildly calcified mitral annulus with mild mitral regurgitation. Moderate calcific aortic stenosis with moderate aortic regurgitation. Device wire present within the right heart. Mildly dilated right atrium. Mild to moderate tricuspid regurgitation with estimated PASP 43 mmHg.  Patient Profile     83 y.o. 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 He presents with rapid atrial fibrillation in the setting of acute on chronic systolic heart failure.  Assessment & Plan    1. Acute on chronic systolic heart failure. Clinically improved with diuresis. Weight is now down over 10 pounds. Leg edema much improved.  2. Persistent atrial fibrillation. Still having some difficulty controlling heart rate, although trend is improving on high-dose Toprol-XL which was adjusted yesterday. He is also on Lanoxin as well as Eliquis.  3. Tachycardia-bradycardia syndrome status post a Jude pacemaker.  4. Chest CT showing evidence of mucus plugging, rule out endobronchial lesion. He has been evaluated by Dr. Luan Pulling with plan for follow-up chest CT imaging as an outpatient. No cough or hemoptysis.  Discussed with patient. Would continue Toprol-XL at 150 mg twice daily, also Lanoxin and Eliquis. Change Lasix to 40 mg oral daily. Might be able to go home later today unless other treatment is planned per primary team. Additional efforts at heart rate control can be made as an outpatient, he would need to have close follow-up with Dr. Debara Pickett or APP in the  [facility] office.  Signed, Rozann Lesches, MD  12/01/2016, 8:56 AM

## 2016-12-05 ENCOUNTER — Institutional Professional Consult (permissible substitution): Payer: Medicare Other | Admitting: Pulmonary Disease

## 2016-12-06 ENCOUNTER — Ambulatory Visit (HOSPITAL_COMMUNITY): Payer: Medicare Other

## 2016-12-07 ENCOUNTER — Encounter: Payer: Self-pay | Admitting: Internal Medicine

## 2016-12-07 ENCOUNTER — Ambulatory Visit (INDEPENDENT_AMBULATORY_CARE_PROVIDER_SITE_OTHER): Payer: Medicare Other | Admitting: Internal Medicine

## 2016-12-07 ENCOUNTER — Institutional Professional Consult (permissible substitution): Payer: Medicare Other | Admitting: Pulmonary Disease

## 2016-12-07 VITALS — BP 112/74 | HR 89 | Ht 68.0 in | Wt 152.0 lb

## 2016-12-07 DIAGNOSIS — J449 Chronic obstructive pulmonary disease, unspecified: Secondary | ICD-10-CM | POA: Diagnosis not present

## 2016-12-07 DIAGNOSIS — R9389 Abnormal findings on diagnostic imaging of other specified body structures: Secondary | ICD-10-CM | POA: Diagnosis not present

## 2016-12-07 DIAGNOSIS — F17210 Nicotine dependence, cigarettes, uncomplicated: Secondary | ICD-10-CM

## 2016-12-07 HISTORY — DX: Chronic obstructive pulmonary disease, unspecified: J44.9

## 2016-12-07 MED ORDER — FLUTICASONE-UMECLIDIN-VILANT 100-62.5-25 MCG/INH IN AEPB: 1.0000 | INHALATION_SPRAY | Freq: Every day | RESPIRATORY_TRACT | 0 refills | Status: AC

## 2016-12-07 NOTE — Patient Instructions (Signed)
The key is to stop smoking completely before smoking completely stops you!    Trelegy one click each am - take two good deep off of one click and hold for 5 secs and rinse mouth   Please schedule a follow up office visit in 2 weeks, sooner if needed - pfts on return and if can't schedule this way then we can do bedside spirometry

## 2016-12-07 NOTE — Progress Notes (Signed)
Subjective:     Patient ID: Cody George, male   DOB: [birth year], 83 y.o.   MRN: [id number]  HPI  49 yowm active smoker referred to pulmonary clinic 12/07/2016 by Dr   Oneida Alar for pulmonary clearance for AAA repair s/p admit:   Admit date: 11/26/2016 Discharge date: 12/01/2016    Brief/Interim Summary: 83 year old male with history of paroxysmalA. 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 He was recently diagnosed with have AAA an idea aneurysm incidentally on CT scan and informs being seen by vascular surgeon Dr. Oneida Alar as outpatient. In the ED he was tachypneic to the 30s and in rapid A. Fib with heart rate up to 150s. Chest x-ray showed findings of pulmonary edema. Blood work showed mild hyponatremia and mild cytopenia.BNP elevated to 1175. CT angiogram of the abdomen done for his abdominal distention showed fusiform infrarenal AAA and iliac aneurysms. Vascular surgery consulted by ED physician who recommended no acute intervention and outpatient follow-up. Patient admitted to stepdown unit for A. Fib with RVR and acute on chronic systolic CHF.  Discharge Diagnoses:  Principal Problem:   Acute on chronic systolic CHF (congestive heart failure) (HCC) Active Problems:   Atrial fibrillation with RVR (HCC)   AAA (abdominal aortic aneurysm) (HCC)   Bronchial obstruction   Hyponatremia   Pressure injury of skin  Atrial fibrillation with RVR ([place]) Patient was initially treated with cardizem infusion and was eventually weaned off. He was also started on digoxin. Toprol was increased to 150 mg twice a day . Cardiology was  following. Heart rate is better controlled. Plan is to continue current regimen and follow-up with cardiology as an outpatient. Continue anticoagulation with apixiban.   Acute on chronic systolic CHF (congestive heart failure) (HCC) Diuresed quite well on IV Lasix (-12.2L since admission). 2-D echo with worsening EF of 30%. Added ACE inhibitor. Appears to be euvolemic. Lasix transitioned to 40 mg daily.  AAA (abdominal aortic aneurysm) ([place]) CT angiogram done in the ED shows infrarenal fusiform abdominal aortic aneurysm measuring 4.6 x 4.2 cm with the aneurysmal sac measuring 6.5 cm. Associated aneurysmal dilatation of bilateral common iliac artery with larger on the left measuring 3.2 cm.recommends follow-up CT in 6 months. Dr. Donzetta Matters was consulted by ED physician who recommended outpatient follow-up. Patient reports that he saw Dr. Oneida Alar recently recommended monitoring and outpatient follow-up.  Bronchial obstruction As seen on CT chest with findings of persistent endobronchial soft tissue versus mucus obstruction in bilateral lower lobes which could be either due to mucus plugging with atelectasis versus endobronchial lesion.Patient did report chronic cough. He was started on mucolytics and bronchodilators. Pulmonology following. Overall breathing has improved. He has been producing thick sputum. He has outpatient pulmonology scheduled will likely need repeat imaging in the next 1 month..  Liver lesion There is also a 7 mm indeterminate nodule in the left lower lobe ofliver. Follow as an outpatient  Hyponatremia Mild. Possibly hypervolemic. Patient is asymptomatic  Pressure injury of skin Sacral pressure injury, stage I, present on admission, treated with foam per nursing.   12/07/2016 1st [facility] office visit/ Wilfred Siverson   Chief Complaint  Patient presents with  . Pulmonary  Consult    Referred by Dr. Oneida Alar for pulmonary clearance for AAA repair.    main issue is  low energy x 2 years since pneumonia rx per Delphina Cahill with EF 30% by Echo 11/28/16  Generalized chest discomfort positional / better when lie down / not pleuritic  Has become very sedentary but doe = MMRC3 = can't walk 100 yards even at a slow pace at a flat grade s stopping due to sob   Sleeps ok but somewhat congested/ rattling by am with prod of sev tbps thick white mucus  Says has cut way down on smoking but still at 1/2 ppd at this point Does not use any inhalers   No obvious day to day or daytime variability or assoc purulent sputum or mucus plugs or hemoptysis or cp or chest tightness, subjective wheeze or overt sinus or hb symptoms. No unusual exp hx or h/o childhood pna/ asthma or knowledge of premature birth.  Sleeping ok flat without nocturnal   exacerbation  of respiratory  c/o's or need for noct saba. Also denies any obvious fluctuation of symptoms with weather or environmental changes or other aggravating or alleviating factors except as outlined above   Current Allergies, Complete Past Medical History, Past Surgical History, Family History, and Social History were reviewed in [employer] record.  ROS  The following are not active complaints unless bolded Hoarseness, sore throat, dysphagia, dental problems, itching, sneezing,  nasal congestion or discharge of excess mucus or purulent secretions, ear ache,   fever, chills, sweats, unintended wt loss or wt gain, classically pleuritic or exertional cp,  orthopnea pnd or leg swelling, presyncope, palpitations, abdominal pain, anorexia, nausea, vomiting, diarrhea  or change in bowel habits or change in bladder habits, change in stools or change in urine, dysuria, hematuria,  rash, arthralgias, visual complaints, headache, numbness, weakness or ataxia or problems with walking or coordination,  change in mood/affect or memory.        Current Meds  Medication Sig  . digoxin (LANOXIN) 0.25 MG tablet Take 1 tablet (0.25  mg total) by mouth daily.  Marland Kitchen ELIQUIS 5 MG TABS tablet TAKE ONE TABLET TWICE DAILY  . furosemide (LASIX) 40 MG tablet TAKE ONE (1) TABLET EACH DAY  . guaiFENesin (MUCINEX) 600 MG 12 hr tablet Take 1 tablet (600 mg total) by mouth 2 (two) times daily.  Marland Kitchen HYDROcodone-acetaminophen (NORCO/VICODIN) 5-325 MG tablet One tablet every four hours as needed for acute pain.  Limit of five days per [place] statue.  . metoprolol succinate (TOPROL-XL) 50 MG 24 hr tablet Take 3 tablets (150 mg total) by mouth 2 (two) times daily.  . mirtazapine (REMERON) 30 MG tablet Take 30 mg by mouth at bedtime.   . tamsulosin (FLOMAX) 0.4 MG CAPS capsule Take 0.4 mg by mouth every evening.   . vitamin B-12 (CYANOCOBALAMIN) 1000 MCG tablet Take 1,000 mcg by mouth daily.  Marland Kitchen VITAMIN C, CALCIUM ASCORBATE, PO Take 1,000 mg by mouth daily.   Marland Kitchen VITAMIN D, CHOLECALCIFEROL, PO Take 1,000 mg by mouth daily.             Review of Systems     Objective:   Physical Exam    chronically ill elderly wm / slowed mentation / rattling cough /tar staining fingernails    Wt Readings from Last 3 Encounters:  12/07/16 152 lb (68.9 kg)  11/30/16 149 lb 11.1 oz (67.9 kg)  11/23/16 167 lb (75.8 kg)    Vital  signs reviewed   - Note on arrival 02 sats  98% on RA   HEENT: nl dentition, turbinates bilaterally, and oropharynx. Nl external ear canals without cough reflex   NECK :  without JVD/Nodes/TM/ nl carotid upstrokes bilaterally   LUNGS: no acc muscle use,  slt barrel contour chest with junky exp > insp rhonchi bilaterally    CV:  RRR  no s3 or murmur or increase in P2, and no edema   ABD:  soft and nontender with nl inspiratory excursion in the supine position. No bruits or organomegaly appreciated, bowel sounds nl  MS:  Nl gait/ ext warm without deformities, calf tenderness, cyanosis or clubbing No obvious joint restrictions   SKIN: warm and dry without lesions    NEURO:  alert, approp, nl sensorium with  no motor  or cerebellar deficits apparent.         I personally reviewed images and agree with radiology impression as follows:   Chest CTa  11/04/16 1. Several small, linear right lower lobe pulmonary arterial filling defects. These are nonocclusive and are compatible with subacute emboli. No definite acute pulmonary emboli seen. 2. Chronic soft tissue density filling a right lower lobe bronchus with associated atelectasis. The chronicity is compatible with a chronic mucous plug or scarring rather than a neoplastic process. 3. Small left pleural effusion. 4. Mild changes of COPD. 5. Calcified coronary artery and aortic atherosclerosis.  .   Assessment:

## 2016-12-08 DIAGNOSIS — F17210 Nicotine dependence, cigarettes, uncomplicated: Secondary | ICD-10-CM | POA: Insufficient documentation

## 2016-12-08 DIAGNOSIS — R9389 Abnormal findings on diagnostic imaging of other specified body structures: Secondary | ICD-10-CM | POA: Insufficient documentation

## 2016-12-08 NOTE — Assessment & Plan Note (Addendum)
See CTa 11/04/16 with chronic bronchial obst ? Mucus and prob chronic PE - rec venous dopplers to be sure he doesn't need a filter perioperatively - encouraged use of mucinex/ stop smoking / trelegy for the mucus plugs  - consider intraop bronch to clear out any residual mucus and reduce risk of post op atx/ resp failure (since he will be off eliquis at that point anyway)

## 2016-12-08 NOTE — Assessment & Plan Note (Signed)
>   3 min   Advised on risk of surgery increased if doesn't stop smoking x 2 weeks min but he did not commit.  Also explained:   that although we never turn away smokers from the pulmonary clinic, we do ask that they understand that the recommendations that we make  won't work nearly as well in the presence of continued cigarette exposure.  In fact, we may very well  reach a point where we can't promise to help the patient if he/she can't quit smoking. (We can and will promise to try to help, we just can't promise what we recommend will really work)

## 2016-12-08 NOTE — Assessment & Plan Note (Signed)
He is actively smoking with at least GOLD group B symptoms with obvious issues with CB related mucociliary dysfunction at this point and really a poor candidate for elective aneurysm surgery > the risk could be reduced but not eliminated with 2 weeks off cigs and on bronchodilators so I rec   trelegy one click each am x 2 week sample  F/u with pfts in 2 weeks   He and his fm will need to accept the higher risk as I acknowledged up front there is not "fixed line" in term of lung function (which I didn't check today as he hasn't been treated yet but are likely quite poor)  that would be prohibitive in non-thoracic surgery but if surgery becomes more urgent or he continues to smoke with poor cough mechanics then he would need to understand that medical science has it's limitations and he (not his fm) should decide whether to place limits on aggressive care should complications arise (chronic vent dep/ HD etc)   If he accepts the risks and is able to articulate end of life wishes then he could be cleared at any point for more expedient surgery even with poor preop function.

## 2016-12-09 ENCOUNTER — Telehealth: Payer: Self-pay | Admitting: *Deleted

## 2016-12-09 NOTE — Telephone Encounter (Signed)
Be sure to let him know that I can't clear him for surgery without having this done first - it may be that the vascular surgeons don't need this and since it's a vascular problem I'm concerned about they don't need my independent opinion so I will leave it up to Dr Oneida Alar and him to sort out

## 2016-12-09 NOTE — Telephone Encounter (Signed)
-----   Message from Tanda Rockers, MD sent at 12/08/2016  6:15 AM EDT ----- Needs venous dopplers both legs preop

## 2016-12-09 NOTE — Telephone Encounter (Signed)
Spoke with the pt and notified of recs per MW  He refused to have the test done and states will call back if he changes his mind  Will forward to MW as Cody George

## 2016-12-13 ENCOUNTER — Ambulatory Visit (INDEPENDENT_AMBULATORY_CARE_PROVIDER_SITE_OTHER): Payer: Medicare Other | Admitting: *Deleted

## 2016-12-13 DIAGNOSIS — I495 Sick sinus syndrome: Secondary | ICD-10-CM

## 2016-12-13 NOTE — Progress Notes (Signed)
Remote pacemaker transmission.   

## 2016-12-16 ENCOUNTER — Telehealth: Payer: Self-pay | Admitting: Internal Medicine

## 2016-12-16 NOTE — Telephone Encounter (Signed)
Ok .. Keep trying to get an appointment.  Dr. Lemmie Evens

## 2016-12-16 NOTE — Telephone Encounter (Signed)
Mrs. Espindola is calling to speak about the medication in which Cody George was sent home with from the hospital . States that I makes him feel worse thatn he has been feeling and its not the same as it was before. Please call

## 2016-12-16 NOTE — Telephone Encounter (Signed)
Returned call to wife. Patient was in hospital in October. His metoprolol succinate was increased from 50mg  daily to 150mg  BID. He is experiencing weakness since this med change. He has not monitored his BP or HR since he has been home. Wife states patient has since decreased his metoprolol succinate to 50mg  once daily.  He cancelled all his post hospital visits. He was supposed to f/up with pulmonary and vascular surgery.  Advised wife to have patient monitor home BP and HR. Offered appt on 11/8 @ 1130 with MD but they cannot make this - will keep encounter open to look for cancellations.   Routed to MD for advice

## 2016-12-21 ENCOUNTER — Encounter: Payer: Self-pay | Admitting: Cardiology

## 2016-12-21 ENCOUNTER — Ambulatory Visit: Payer: Medicare Other | Admitting: Internal Medicine

## 2016-12-21 NOTE — Telephone Encounter (Signed)
Patient scheduled for MD OV 12/22/16

## 2016-12-22 ENCOUNTER — Ambulatory Visit: Payer: Medicare Other | Admitting: Vascular Surgery

## 2016-12-22 ENCOUNTER — Ambulatory Visit: Payer: Medicare Other | Admitting: Internal Medicine

## 2016-12-22 ENCOUNTER — Encounter: Payer: Self-pay | Admitting: Internal Medicine

## 2016-12-22 VITALS — BP 117/76 | HR 92 | Ht 68.0 in | Wt 153.0 lb

## 2016-12-22 DIAGNOSIS — Z79899 Other long term (current) drug therapy: Secondary | ICD-10-CM

## 2016-12-22 DIAGNOSIS — I5022 Chronic systolic (congestive) heart failure: Secondary | ICD-10-CM

## 2016-12-22 DIAGNOSIS — Z950 Presence of cardiac pacemaker: Secondary | ICD-10-CM

## 2016-12-22 DIAGNOSIS — R5383 Other fatigue: Secondary | ICD-10-CM

## 2016-12-22 DIAGNOSIS — I4819 Other persistent atrial fibrillation: Secondary | ICD-10-CM

## 2016-12-22 DIAGNOSIS — I481 Persistent atrial fibrillation: Secondary | ICD-10-CM | POA: Diagnosis not present

## 2016-12-22 NOTE — Progress Notes (Signed)
OFFICE NOTE  Chief Complaint:  Hospital follow-up, fatigue  Primary Care Physician: Celene Squibb, MD  HPI:  Cody George is an 83 year old gentleman who has a history of atrial fibrillation and tachy-brady syndrome as well as paroxysmal atrial fibrillation and sinus node arrest. He underwent a St. Jude Accent DR dual-chamber pacemaker in June of 2013. He has done fairly well other than he had some hypotension on lisinopril which was discontinued. He has recently had some tachyarrhythmias and had increased his metoprolol succinate to 25 mg daily due to tachyarrhythmias. He is enrolled in a home monitoring service with the [facility] patient care network. He was successfully transitioned over to Eliquis she is taking without any bleeding difficulty. He denies any shortness of breath or worsening chest pain. He is due for a remote pacer check in a few days.  Cody George returns for follow-up today. He is feeling well denies any chest pain or worsening shortness of breath. He has no palpitations. Blood pressure is well controlled. He had recent bilateral cataract surgery and is seeing much better. There are no bleeding issues.  I saw Cody George back in the office today for follow-up. Tells me a few months ago he had pneumonia and has been very slow to recover. Chest x-ray shows improvement however there are changes consistent with COPD. He has a greater than 65 year history of smoking. I do not believe he carries a diagnosis of COPD and is currently not on any medications for this. He reports some worsening shortness of breath as well has fatigue. He denies any particular chest pain. He does have valvular heart disease and history of aortic stenosis. Recent echo in March shows some worsening of valvular function with now moderate aortic stenosis.  Cody George returns today for follow-up. Overall he is without complaints. He scheduled to see Dr. Loletha Grayer in the office next week for follow-up of his  pacemaker. He is atrially paced today. He had a low burden of atrial fibrillation but remains on Eliquis. He is asymptomatic with this. He's had no bleeding problems on Eliquis. Blood pressure is high normal today 152/82. He says that generally is between 332 and 951 systolic at home. I reminded him that his prior echocardiogram showed what may be moderate aortic stenosis. Interestingly, I have not been able to auscultate that on exam. His prior echo was almost a year ago and will be due for repeat echo this year.  11/04/2015  Cody George was seen today in the office. He reports that over the past several months she's had a significant decline. He has had worsening energy and fatigue as well as shortness of breath. He can only walk short distances before becoming significantly short of breath. He is also developed a mildly productive cough with a tannish sputum. He is concerned about whether he might have had recurrent pneumonia. He does have a history of COPD and pneumonia in the past. An echocardiogram was performed and March 2017 which showed mild to moderate aortic stenosis by valve gradient however visually the aortic valve appeared to be moderate to severely stenosed. On exam is very difficult with his COPD and hyperexpansion to auscultate his aortic murmur and I cannot reliably hear the second heart sound. Cody George appeared to be short of breath today and was unable to finish sentences at times. He denies any fevers or chills at home. He has had some mild recent weight loss. He also complains of lower extremity swelling,  which is worse on the left leg than the right. There is some orthopnea.  11/16/2015  Cody George returns today for follow-up. He reports feeling much better. His weight has come down several pounds and he notes marked improvement in his edema. His shortness of breath is also improved and he feels much more energized. Unfortunately his echocardiogram shows a newly reduced LVEF of  20-25% with global hypokinesis. 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 His chest x-ray showed no evidence of pneumonia.  01/06/2016  Cody George was seen today in follow-up. He underwent cardioversion after loading with amiodarone which required 3 shocks but ultimately he converted back to sinus rhythm. Today he is in an atrial paced rhythm but appears to be maintaining sinus. Unfortunately he feels "horrible". It's not clear why he feels worse today but has felt better in the past. He says some days are good and some days are bad. He also became very tearful in the office today. He is still quite upset about the death of a family member last year. I'm concerned that he is struggling from depression. He does not appear volume overloaded on exam. Now that he is back in sinus rhythm, his cardiac output should be better but he still reports fatigue, poor sleep at night, lack of interest in things, tearfulness and other symptoms which may be more likely to be depression.  04/13/2016  Cody George returns today for follow-up. He is doing markedly better. His color is improved significantly and so is his attitude. He has generally maintained a sinus rhythm since cardioversion on 12/07/2015. He was on amiodarone but had significant side effects with this. The medication was then wean down and discontinued. His overall burden of atrial fibrillation was about 27%. In general it seems like his A. fib may be playing a role in his fatigue. He recently has been describing some left upper extremity swelling. He saw Dr. Loletha Grayer, who felt that this could be occlusion of the left subclavian vein related to the pacemaker leads with possibly super impose thrombus. Today he reports is been no significant change in his swelling despite being on Eliquis for more than 3 months. He has been trying to keep  his arm elevated.  10/11/2016  Cody George returns today for follow-up. He recently saw Dr. Loletha Grayer for interrogation of his pacemaker. He had an echo in February which showed improvement in EF from 20-25% up to 40%. He says he feels great and in fact if he didn't have ongoing back pain he be in very good shape. He's had resolution of his upper extremity swelling. He does get some right lower extremity swelling. He has hemosiderin deposition of the right lower extremity and some superficial varicosities suggesting poor venous return. He notes marked improvement when elevating his foot on aggressive suspect this is venous insufficiency. He may benefit from wearing compression stocking. He denies any further syncope. He has no chest pain or worsening shortness of breath.  12/22/2016  Cody George returns today for hospital follow-up.  He was recently hospitalized in any pain in [place] for A. fib with RVR and acute congestive heart failure.  He required IV diuretics and medication adjustment for rate control.  He lost about 10 pounds with diuresis and says his breathing is better however he feels very fatigued and has almost no energy.  On discharge, surprisingly he was told to increase his Toprol-XL from  50 mg daily to 300 mg or 3 tablets twice daily.  Passive dose change and I suspect may be a medication error.  He said he took this for a couple days and felt terrible and then went back to taking Toprol-XL 50 mg daily.  On the day prior to discharge his schedule his meds indicated he was taking metoprolol succinate 150 mg twice daily, so it seems that he was on this increased dose.  Dr. Domenic Polite had recommended increasing the dose to 100 in the morning and 50 at night.  He was also started on digoxin.  Heart rate and blood pressure appear normal today.  PMHx:  Past Medical History:  Diagnosis Date  . Acute systolic congestive heart failure ([place]) 12/07/2015  . Arthritis   . Cellulitis   . Community acquired  pneumonia 04/14/2014  . COPD mixed type ([place]) 12/07/2016  . Deep vein thrombosis (DVT) of left upper extremity ([place]) 04/13/2016  . Depression 01/06/2016  . Dysrhythmia    PAF  . GERD (gastroesophageal reflux disease)   . History of nuclear stress test 10/05/2010   dipyridamole; normal pattern of perfusion in all regions; no significant ishcemia, low risk scan   . Hypertension   . OA (osteoarthritis)   . Pacemaker 07/22/2011   St. 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 Paroxysmal atrial fibrillation ([facility])   . Prostate cancer ([place])    radiation  . Shortness of breath     Past Surgical History:  Procedure Laterality Date  . HERNIA REPAIR Bilateral   . NM MYOCAR PERF WALL MOTION  10/05/10   normal  . PACEMAKER INSERTION  07/22/2011   St. 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 PILONIDAL CYST DRAINAGE     x2  . TRANSTHORACIC ECHOCARDIOGRAM  10/05/2010   EF=50-55%, normal LV systolic function; LA & RA mildly dilated; mild MR; mild TR with RV systolic pressure elevted at 30-68mmHg; AV mildly sclerotic with mild calcif of AV leaflets, mild valvular AS, mild-mod regurg; trace pulm valve regurg  . US ECHOCARDIOGRAPHY  10/05/10   LA mildly dilated, mild TR, mild ca+ of AOV ,mild to mod. AI    FAMHx:  Family History  Problem Relation Age of Onset  . Sudden Cardiac Death Neg Hx     SOCHx:   reports that he has been smoking.  He has a 144.00 pack-year smoking history. he has never used smokeless tobacco. He reports that he does not drink alcohol or use drugs.  ALLERGIES:  No Known Allergies  ROS: Pertinent items noted in HPI and remainder of comprehensive ROS otherwise negative.  HOME MEDS: Current Outpatient Medications  Medication Sig Dispense Refill  . digoxin (LANOXIN) 0.25 MG tablet Take 1 tablet (0.25 mg total) by mouth daily. 30 tablet 0  . ELIQUIS 5 MG TABS tablet TAKE ONE TABLET TWICE DAILY  180 tablet 1  . Fluticasone-Umeclidin-Vilant (TRELEGY ELLIPTA) 100-62.5-25 MCG/INH AEPB Inhale 1 puff into the lungs daily. 30 each 0  . furosemide (LASIX) 40 MG tablet TAKE ONE (1) TABLET EACH DAY 30 tablet 11  . guaiFENesin (MUCINEX) 600 MG 12 hr tablet Take 1 tablet (600 mg total) by mouth 2 (two) times daily. 60 tablet 0  . HYDROcodone-acetaminophen (NORCO/VICODIN) 5-325 MG tablet One tablet every four hours as needed for acute pain.  Limit of five days per [place]. 30 tablet 0  . metoprolol succinate (TOPROL-XL) 50 MG  24 hr tablet Take 50 mg daily by mouth. Take with or immediately following a meal.    . mirtazapine (REMERON) 30 MG tablet Take 30 mg by mouth at bedtime.     . tamsulosin (FLOMAX) 0.4 MG CAPS capsule Take 0.4 mg by mouth every evening.     . vitamin B-12 (CYANOCOBALAMIN) 1000 MCG tablet Take 1,000 mcg by mouth daily.    Marland Kitchen VITAMIN C, CALCIUM ASCORBATE, PO Take 1,000 mg by mouth daily.     Marland Kitchen VITAMIN D, CHOLECALCIFEROL, PO Take 1,000 mg by mouth daily.      No current facility-administered medications for this visit.     LABS/IMAGING: No results found for this or any previous visit (from the past 48 hour(s)). No results found.  VITALS: BP 117/76   Pulse 92   Ht 5\' 8"  (1.727 m)   Wt 153 lb (69.4 kg)   BMI 23.26 kg/m   EXAM: General appearance: alert and fatigued Neck: no carotid bruit, no JVD and thyroid not enlarged, symmetric, no tenderness/mass/nodules Lungs: clear to auscultation bilaterally Heart: irregularly irregular rhythm, S1, S2 normal, systolic murmur: systolic ejection 2/6, crescendo at 2nd right intercostal space and possibly a diastolic mumur Abdomen: soft, non-tender; bowel sounds normal; no masses,  no organomegaly Extremities: extremities normal, atraumatic, no cyanosis or edema and venous stasis dermatitis noted Pulses: 2+ and symmetric Skin: Skin color, texture, turgor normal. No rashes or lesions Neurologic: Grossly normal Psych:  Pleasant  EKG: Deferred  ASSESSMENT: 1. Acute on chronic systolic congestive heart failure, LVEF 20-25% (improved to 40% - 2018) 2. Persistent atrial fibrillation - s/p DCCV (intolerant to amiodarone) 3. Status post St. Jude Accent DR pacemaker for sinus arrest 4. Labile hypertension - controlled 5. Chronic anticoagulation on Eliquis 6. COPD 7. Aortic stenosis - mild to moderate (03/2016)  PLAN: 1.   Mr. Knaak T needs to be very fatigued after discharge.  He was sent home on 150 mg twice daily of Toprol-XL but was previously on 50 mg daily.  Heart rate is 90 today.  He seems to be well controlled only on the 50 mg dose.  We will continue 50 mg once daily.  He might be over diuresed and we will plan to check labs today including a metabolic profile, BNP, digoxin level as well as a a CBC.  He is on Eliquis but denies any bleeding.  Pixie Casino, MD, [facility] Attending Cardiologist [place] Hilty 12/22/2016, 1:34 PM

## 2016-12-22 NOTE — Patient Instructions (Signed)
Your physician recommends that you return for lab work TODAY CBC, CMET, digoxin, BNP  Your physician recommends that you schedule a follow-up appointment in [place] with Dr. Debara Pickett.

## 2016-12-23 ENCOUNTER — Other Ambulatory Visit: Payer: Self-pay | Admitting: Internal Medicine

## 2016-12-23 ENCOUNTER — Telehealth: Payer: Self-pay | Admitting: Internal Medicine

## 2016-12-23 DIAGNOSIS — Z79899 Other long term (current) drug therapy: Secondary | ICD-10-CM

## 2016-12-23 DIAGNOSIS — R7889 Finding of other specified substances, not normally found in blood: Secondary | ICD-10-CM

## 2016-12-23 LAB — COMPREHENSIVE METABOLIC PANEL
ALT: 11 IU/L (ref 0–44)
AST: 17 IU/L (ref 0–40)
Albumin/Globulin Ratio: 2 (ref 1.2–2.2)
Albumin: 4.1 g/dL (ref 3.5–4.7)
Alkaline Phosphatase: 100 IU/L (ref 39–117)
BILIRUBIN TOTAL: 0.8 mg/dL (ref 0.0–1.2)
BUN/Creatinine Ratio: 19 (ref 10–24)
BUN: 14 mg/dL (ref 8–27)
CALCIUM: 9.1 mg/dL (ref 8.6–10.2)
CHLORIDE: 93 mmol/L — AB (ref 96–106)
CO2: 26 mmol/L (ref 20–29)
Creatinine, Ser: 0.74 mg/dL — ABNORMAL LOW (ref 0.76–1.27)
GFR, EST AFRICAN AMERICAN: 98 mL/min/{1.73_m2} (ref >59)
GFR, EST NON AFRICAN AMERICAN: 85 mL/min/{1.73_m2} (ref >59)
GLOBULIN, TOTAL: 2.1 g/dL (ref 1.5–4.5)
Glucose: 85 mg/dL (ref 65–99)
POTASSIUM: 5.1 mmol/L (ref 3.5–5.2)
SODIUM: 133 mmol/L — AB (ref 134–144)
Total Protein: 6.2 g/dL (ref 6.0–8.5)

## 2016-12-23 LAB — CBC
HEMATOCRIT: 45.4 % (ref 37.5–51.0)
Hemoglobin: 15 g/dL (ref 13.0–17.7)
MCH: 31.4 pg (ref 26.6–33.0)
MCHC: 33 g/dL (ref 31.5–35.7)
MCV: 95 fL (ref 79–97)
PLATELETS: 244 10*3/uL (ref 150–379)
RBC: 4.77 x10E6/uL (ref 4.14–5.80)
RDW: 14.4 % (ref 12.3–15.4)
WBC: 7.8 10*3/uL (ref 3.4–10.8)

## 2016-12-23 LAB — DIGOXIN LEVEL: Digoxin, Serum: 1.8 ng/mL — ABNORMAL HIGH (ref 0.5–0.9)

## 2016-12-23 LAB — PRO B NATRIURETIC PEPTIDE: NT-Pro BNP: 6835 pg/mL — ABNORMAL HIGH (ref 0–486)

## 2016-12-23 MED ORDER — DIGOXIN 125 MCG PO TABS: 0.1250 mg | ORAL_TABLET | Freq: Every day | ORAL | 5 refills | Status: DC

## 2016-12-23 NOTE — Telephone Encounter (Signed)
-----   Message from Pixie Casino, MD sent at 12/23/2016  7:51 AM EST ----- Labs do not show anemia or dehydration - BNP remains elevated. Continue current diuretic dose. Digoxin level too high - decrease to 0.125 mg (from 0.25 mg) daily. Recheck BMET and digoxin level in 2 weeks.  Dr. Lemmie Evens

## 2016-12-23 NOTE — Telephone Encounter (Signed)
Patient and wife notified of results. Aware a new Rx for digoxin will be sent to pharmacy. Advised to have repeat lab work the week of Nov 26 at [facility] in [place].

## 2016-12-23 NOTE — Telephone Encounter (Signed)
Cody George is calling from [facility] because they want to verify that the dosage has changed on his Digoxin . Please call at [phone]

## 2016-12-23 NOTE — Telephone Encounter (Signed)
Called pharmacy to confirm that dose of digoxin has changed.

## 2017-01-02 LAB — CUP PACEART REMOTE DEVICE CHECK
Battery Remaining Percentage: 65 %
Brady Statistic AP VS Percent: 94 %
Brady Statistic AS VP Percent: 1.5 %
Brady Statistic AS VS Percent: 3.3 %
Brady Statistic RV Percent Paced: 65 %
Implantable Lead Implant Date: 20130607
Implantable Lead Location: 753860
Lead Channel Impedance Value: 440 Ohm
Lead Channel Pacing Threshold Amplitude: 1 V
Lead Channel Pacing Threshold Pulse Width: 0.5 ms
Lead Channel Sensing Intrinsic Amplitude: 12 mV
Lead Channel Sensing Intrinsic Amplitude: 2.7 mV
Lead Channel Setting Pacing Amplitude: 2.5 V
Lead Channel Setting Pacing Amplitude: 2.5 V
Lead Channel Setting Pacing Pulse Width: 0.5 ms
Lead Channel Setting Sensing Sensitivity: 2 mV
MDC IDC LEAD IMPLANT DT: 20130607
MDC IDC LEAD LOCATION: 753859
MDC IDC MSMT BATTERY REMAINING LONGEVITY: 62 mo
MDC IDC MSMT BATTERY VOLTAGE: 2.9 V
MDC IDC MSMT LEADCHNL RA PACING THRESHOLD AMPLITUDE: 0.875 V
MDC IDC MSMT LEADCHNL RA PACING THRESHOLD PULSEWIDTH: 0.4 ms
MDC IDC MSMT LEADCHNL RV IMPEDANCE VALUE: 440 Ohm
MDC IDC PG IMPLANT DT: 20130607
MDC IDC PG SERIAL: 7353349
MDC IDC SESS DTM: 20181030060017
MDC IDC STAT BRADY AP VP PERCENT: 1 %
MDC IDC STAT BRADY RA PERCENT PACED: 4.7 %

## 2017-01-17 DIAGNOSIS — R7889 Finding of other specified substances, not normally found in blood: Secondary | ICD-10-CM | POA: Diagnosis not present

## 2017-01-17 DIAGNOSIS — Z79899 Other long term (current) drug therapy: Secondary | ICD-10-CM | POA: Diagnosis not present

## 2017-01-18 LAB — BASIC METABOLIC PANEL
BUN / CREAT RATIO: 22 (ref 10–24)
BUN: 18 mg/dL (ref 8–27)
CHLORIDE: 91 mmol/L — AB (ref 96–106)
CO2: 29 mmol/L (ref 20–29)
Calcium: 9.6 mg/dL (ref 8.6–10.2)
Creatinine, Ser: 0.82 mg/dL (ref 0.76–1.27)
GFR calc Af Amer: 94 mL/min/{1.73_m2} (ref >59)
GFR calc non Af Amer: 81 mL/min/{1.73_m2} (ref >59)
GLUCOSE: 117 mg/dL — AB (ref 65–99)
POTASSIUM: 4.3 mmol/L (ref 3.5–5.2)
SODIUM: 135 mmol/L (ref 134–144)

## 2017-01-18 LAB — DIGOXIN LEVEL: DIGOXIN, SERUM: 0.7 ng/mL (ref 0.5–0.9)

## 2017-01-20 ENCOUNTER — Ambulatory Visit (HOSPITAL_COMMUNITY)
Admission: RE | Admit: 2017-01-20 | Discharge: 2017-01-20 | Disposition: A | Discharge status: Home / Self Care | Payer: Medicare Other | Source: Ambulatory Visit | Attending: Internal Medicine | Admitting: Internal Medicine

## 2017-01-20 ENCOUNTER — Other Ambulatory Visit (HOSPITAL_COMMUNITY): Payer: Self-pay | Admitting: Internal Medicine

## 2017-01-20 DIAGNOSIS — R0902 Hypoxemia: Secondary | ICD-10-CM | POA: Diagnosis not present

## 2017-01-20 DIAGNOSIS — R05 Cough: Secondary | ICD-10-CM | POA: Diagnosis not present

## 2017-01-20 DIAGNOSIS — R0989 Other specified symptoms and signs involving the circulatory and respiratory systems: Secondary | ICD-10-CM | POA: Diagnosis not present

## 2017-01-20 DIAGNOSIS — J9811 Atelectasis: Secondary | ICD-10-CM | POA: Diagnosis not present

## 2017-01-20 DIAGNOSIS — R059 Cough, unspecified: Secondary | ICD-10-CM

## 2017-01-20 DIAGNOSIS — J449 Chronic obstructive pulmonary disease, unspecified: Secondary | ICD-10-CM | POA: Diagnosis not present

## 2017-01-20 DIAGNOSIS — J189 Pneumonia, unspecified organism: Secondary | ICD-10-CM | POA: Diagnosis not present

## 2017-01-30 DIAGNOSIS — G4700 Insomnia, unspecified: Secondary | ICD-10-CM | POA: Diagnosis not present

## 2017-01-30 DIAGNOSIS — R0602 Shortness of breath: Secondary | ICD-10-CM | POA: Diagnosis not present

## 2017-01-30 DIAGNOSIS — J189 Pneumonia, unspecified organism: Secondary | ICD-10-CM | POA: Diagnosis not present

## 2017-02-01 ENCOUNTER — Ambulatory Visit (INDEPENDENT_AMBULATORY_CARE_PROVIDER_SITE_OTHER): Payer: Self-pay | Admitting: *Deleted

## 2017-02-01 ENCOUNTER — Telehealth: Payer: Self-pay | Admitting: Internal Medicine

## 2017-02-01 DIAGNOSIS — I4819 Other persistent atrial fibrillation: Secondary | ICD-10-CM

## 2017-02-01 DIAGNOSIS — I481 Persistent atrial fibrillation: Secondary | ICD-10-CM

## 2017-02-01 LAB — CUP PACEART INCLINIC DEVICE CHECK
Implantable Lead Location: 753860
Implantable Pulse Generator Implant Date: 20130607
Lead Channel Impedance Value: 412.5 Ohm
Lead Channel Impedance Value: 437.5 Ohm
Lead Channel Sensing Intrinsic Amplitude: 12 mV
Lead Channel Sensing Intrinsic Amplitude: 2.1 mV
Lead Channel Setting Pacing Amplitude: 2.5 V
MDC IDC LEAD IMPLANT DT: 20130607
MDC IDC LEAD IMPLANT DT: 20130607
MDC IDC LEAD LOCATION: 753859
MDC IDC MSMT BATTERY REMAINING LONGEVITY: 67 mo
MDC IDC MSMT BATTERY VOLTAGE: 2.9 V
MDC IDC SESS DTM: 20181219153741
MDC IDC SET LEADCHNL RV PACING PULSEWIDTH: 0.5 ms
MDC IDC SET LEADCHNL RV SENSING SENSITIVITY: 2 mV
MDC IDC STAT BRADY RA PERCENT PACED: 0 %
MDC IDC STAT BRADY RV PERCENT PACED: 50 %
Pulse Gen Model: 2210
Pulse Gen Serial Number: 7353349

## 2017-02-01 NOTE — Telephone Encounter (Signed)
Dr. Loletha Grayer mentioned in his recent remote pacemaker check that he is essentially in a-fib all the time now - probably a factor. Dr. Loletha Grayer wanted to have him come in to reprogram his pacemaker. I would see if we can get him in to device clinic to do that prior to my appointment with him on 12/31.  DR. Lemmie Evens

## 2017-02-01 NOTE — Progress Notes (Signed)
PPM checked in clinic for programming changes r/t chronic AF. Per [facility], pacing mode changed to VVIR. 10 VHR episodes- all EGMs show AF RVR. Mr. Laurich is SOB and fatigued, if no improvement with programming changes he has follow up scheduled with Dr. Debara Pickett. Merlin 03/14/17, ROV with [facility] in August.

## 2017-02-01 NOTE — Telephone Encounter (Signed)
Please call,pt is not doing well,seems to be going down hill. The medicine is just not working,thinks he needs to change his medicine.

## 2017-02-01 NOTE — Telephone Encounter (Signed)
Spoke with pt dtr, they feel the patient is not improving and they want him to be seen sooner than February. Spoke with pt, he reports feeling bad. He has SOB with walking to the mailbox and has to rest with exertion within the home because he is so weak. He reports his bp is fine but can not give me numbers. He reports swelling in his feet for the last 2 days, he does not weigh. He denies any chest pain. Advised the patient he can take an extra lasix today to help with the swelling and SOB. The family was asking if the patient would be a candidate for entresto, explained that is a decision dr hilty can make for them. Scheduled appointment for patient to see dr Debara Pickett 02-13-17 but they would like to make dr hilty aware to see if he has recommendations prior to the appointment.

## 2017-02-01 NOTE — Telephone Encounter (Signed)
Spoke with pt, aware of the recommendations of pacer maker changes. Spoke with the device clinic, the patient will give them a call about getting an appointment today.

## 2017-02-06 DIAGNOSIS — L0390 Cellulitis, unspecified: Secondary | ICD-10-CM | POA: Insufficient documentation

## 2017-02-06 DIAGNOSIS — I10 Essential (primary) hypertension: Secondary | ICD-10-CM | POA: Insufficient documentation

## 2017-02-06 DIAGNOSIS — I480 Paroxysmal atrial fibrillation: Secondary | ICD-10-CM | POA: Insufficient documentation

## 2017-02-06 DIAGNOSIS — K219 Gastro-esophageal reflux disease without esophagitis: Secondary | ICD-10-CM | POA: Insufficient documentation

## 2017-02-06 DIAGNOSIS — M1990 Unspecified osteoarthritis, unspecified site: Secondary | ICD-10-CM | POA: Insufficient documentation

## 2017-02-06 DIAGNOSIS — I499 Cardiac arrhythmia, unspecified: Secondary | ICD-10-CM | POA: Insufficient documentation

## 2017-02-13 ENCOUNTER — Ambulatory Visit: Payer: Medicare Other | Admitting: Internal Medicine

## 2017-02-13 ENCOUNTER — Encounter: Payer: Self-pay | Admitting: Internal Medicine

## 2017-02-13 VITALS — BP 124/69 | HR 82 | Ht 68.0 in | Wt 146.4 lb

## 2017-02-13 DIAGNOSIS — I5023 Acute on chronic systolic (congestive) heart failure: Secondary | ICD-10-CM | POA: Diagnosis not present

## 2017-02-13 DIAGNOSIS — I4819 Other persistent atrial fibrillation: Secondary | ICD-10-CM

## 2017-02-13 DIAGNOSIS — R5383 Other fatigue: Secondary | ICD-10-CM

## 2017-02-13 DIAGNOSIS — I481 Persistent atrial fibrillation: Secondary | ICD-10-CM

## 2017-02-13 DIAGNOSIS — Z950 Presence of cardiac pacemaker: Secondary | ICD-10-CM

## 2017-02-13 MED ORDER — SACUBITRIL-VALSARTAN 24-26 MG PO TABS: 1.0000 | ORAL_TABLET | Freq: Two times a day (BID) | ORAL | 0 refills | Status: DC

## 2017-02-13 NOTE — Patient Instructions (Signed)
Medication Instructions: START Entresto 24-26 mg twice daily.  If you need a refill on your cardiac medications before your next appointment, please call your pharmacy.    Follow-Up: Your physician wants you to follow-up in: one month with Dr. Debara Pickett. You will receive a reminder letter in the mail two months in advance. If you don't receive a letter, please call our office at [phone] to schedule this follow-up appointment.   Thank you for choosing [facility] at [facility]!!

## 2017-02-13 NOTE — Progress Notes (Signed)
OFFICE NOTE  Chief Complaint:  Fatigue, shortness of breath  Primary Care Physician: Celene Squibb, MD  HPI:  Cody George is an 83 year old gentleman who has a history of atrial fibrillation and tachy-brady syndrome as well as paroxysmal atrial fibrillation and sinus node arrest. He underwent a St. Jude Accent DR dual-chamber pacemaker in June of 2013. He has done fairly well other than he had some hypotension on lisinopril which was discontinued. He has recently had some tachyarrhythmias and had increased his metoprolol succinate to 25 mg daily due to tachyarrhythmias. He is enrolled in a home monitoring service with the [facility] patient care network. He was successfully transitioned over to Eliquis she is taking without any bleeding difficulty. He denies any shortness of breath or worsening chest pain. He is due for a remote pacer check in a few days.  Cody George returns for follow-up today. He is feeling well denies any chest pain or worsening shortness of breath. He has no palpitations. Blood pressure is well controlled. He had recent bilateral cataract surgery and is seeing much better. There are no bleeding issues.  I saw Cody George back in the office today for follow-up. Tells me a few months ago he had pneumonia and has been very slow to recover. Chest x-ray shows improvement however there are changes consistent with COPD. He has a greater than 65 year history of smoking. I do not believe he carries a diagnosis of COPD and is currently not on any medications for this. He reports some worsening shortness of breath as well has fatigue. He denies any particular chest pain. He does have valvular heart disease and history of aortic stenosis. Recent echo in March shows some worsening of valvular function with now moderate aortic stenosis.  Cody George returns today for follow-up. Overall he is without complaints. He scheduled to see Dr. Loletha Grayer in the office next week for follow-up of his  pacemaker. He is atrially paced today. He had a low burden of atrial fibrillation but remains on Eliquis. He is asymptomatic with this. He's had no bleeding problems on Eliquis. Blood pressure is high normal today 152/82. He says that generally is between 761 and 607 systolic at home. I reminded him that his prior echocardiogram showed what may be moderate aortic stenosis. Interestingly, I have not been able to auscultate that on exam. His prior echo was almost a year ago and will be due for repeat echo this year.  11/04/2015  Cody George was seen today in the office. He reports that over the past several months she's had a significant decline. He has had worsening energy and fatigue as well as shortness of breath. He can only walk short distances before becoming significantly short of breath. He is also developed a mildly productive cough with a tannish sputum. He is concerned about whether he might have had recurrent pneumonia. He does have a history of COPD and pneumonia in the past. An echocardiogram was performed and March 2017 which showed mild to moderate aortic stenosis by valve gradient however visually the aortic valve appeared to be moderate to severely stenosed. On exam is very difficult with his COPD and hyperexpansion to auscultate his aortic murmur and I cannot reliably hear the second heart sound. Cody George appeared to be short of breath today and was unable to finish sentences at times. He denies any fevers or chills at home. He has had some mild recent weight loss. He also complains of lower extremity  swelling, which is worse on the left leg than the right. There is some orthopnea.  11/16/2015  Cody George returns today for follow-up. He reports feeling much better. His weight has come down several pounds and he notes marked improvement in his edema. His shortness of breath is also improved and he feels much more energized. Unfortunately his echocardiogram shows a newly reduced LVEF of  20-25% with global hypokinesis. 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 His chest x-ray showed no evidence of pneumonia.  01/06/2016  Cody George was seen today in follow-up. He underwent cardioversion after loading with amiodarone which required 3 shocks but ultimately he converted back to sinus rhythm. Today he is in an atrial paced rhythm but appears to be maintaining sinus. Unfortunately he feels "horrible". It's not clear why he feels worse today but has felt better in the past. He says some days are good and some days are bad. He also became very tearful in the office today. He is still quite upset about the death of a family member last year. I'm concerned that he is struggling from depression. He does not appear volume overloaded on exam. Now that he is back in sinus rhythm, his cardiac output should be better but he still reports fatigue, poor sleep at night, lack of interest in things, tearfulness and other symptoms which may be more likely to be depression.  04/13/2016  Cody George returns today for follow-up. He is doing markedly better. His color is improved significantly and so is his attitude. He has generally maintained a sinus rhythm since cardioversion on 12/07/2015. He was on amiodarone but had significant side effects with this. The medication was then wean down and discontinued. His overall burden of atrial fibrillation was about 27%. In general it seems like his A. fib may be playing a role in his fatigue. He recently has been describing some left upper extremity swelling. He saw Dr. Loletha Grayer, who felt that this could be occlusion of the left subclavian vein related to the pacemaker leads with possibly super impose thrombus. Today he reports is been no significant change in his swelling despite being on Eliquis for more than 3 months. He has been trying to keep  his arm elevated.  10/11/2016  Cody George returns today for follow-up. He recently saw Dr. Loletha Grayer for interrogation of his pacemaker. He had an echo in February which showed improvement in EF from 20-25% up to 40%. He says he feels great and in fact if he didn't have ongoing back pain he be in very good shape. He's had resolution of his upper extremity swelling. He does get some right lower extremity swelling. He has hemosiderin deposition of the right lower extremity and some superficial varicosities suggesting poor venous return. He notes marked improvement when elevating his foot on aggressive suspect this is venous insufficiency. He may benefit from wearing compression stocking. He denies any further syncope. He has no chest pain or worsening shortness of breath.  12/22/2016  Cody George returns today for hospital follow-up.  He was recently hospitalized in any pain in [place] for A. fib with RVR and acute congestive heart failure.  He required IV diuretics and medication adjustment for rate control.  He lost about 10 pounds with diuresis and says his breathing is better however he feels very fatigued and has almost no energy.  On discharge, surprisingly he was told to increase his Toprol-XL  from 50 mg daily to 300 mg or 3 tablets twice daily.  Passive dose change and I suspect may be a medication error.  He said he took this for a couple days and felt terrible and then went back to taking Toprol-XL 50 mg daily.  On the day prior to discharge his schedule his meds indicated he was taking metoprolol succinate 150 mg twice daily, so it seems that he was on this increased dose.  Dr. Domenic Polite had recommended increasing the dose to 100 in the morning and 50 at night.  He was also started on digoxin.  Heart rate and blood pressure appear normal today.  02/13/2017  Cody George returns today for follow-up.  He continues to feel fatigued.  He says his energy level is not good.  He is in more persistent atrial  fibrillation and recently his pacemaker was changed to VVIR.  I did lab work which indicates no evidence of anemia however BNP is elevated.  Creatinine is stable.  His LVEF has declined to 30%.  He recently had an elevated digoxin level and I decreased his digoxin intake.  His repeat level was 0.7.  PMHx:  Past Medical History:  Diagnosis Date  . Acute systolic congestive heart failure ([place]) 12/07/2015  . Arthritis   . Cellulitis   . Community acquired pneumonia 04/14/2014  . COPD mixed type ([place]) 12/07/2016  . Deep vein thrombosis (DVT) of left upper extremity ([place]) 04/13/2016  . Depression 01/06/2016  . Dysrhythmia    PAF  . GERD (gastroesophageal reflux disease)   . History of nuclear stress test 10/05/2010   dipyridamole; normal pattern of perfusion in all regions; no significant ishcemia, low risk scan   . Hypertension   . OA (osteoarthritis)   . Pacemaker 07/22/2011   St. 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 Paroxysmal atrial fibrillation (HCC)   . Prostate cancer ([place])    radiation  . Shortness of breath     Past Surgical History:  Procedure Laterality Date  . CARDIOVERSION N/A 12/07/2015   Procedure: CARDIOVERSION;  Surgeon: Pixie Casino, MD;  Location: [facility] ENDOSCOPY;  Service: Cardiovascular;  Laterality: N/A;  . HERNIA REPAIR Bilateral   . INGUINAL HERNIA REPAIR Left 06/10/2015   Procedure: HERNIA REPAIR INGUINAL ADULT WITH MESH;  Surgeon: Aviva Signs, MD;  Location: [facility] OR[facility];  Service: General;  Laterality: Left;  Pt notified to arrive at 6:15am KF  . NM MYOCAR PERF WALL MOTION  10/05/10   normal  . PACEMAKER INSERTION  07/22/2011   St. 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 PERMANENT PACEMAKER INSERTION N/A 07/22/2011   Procedure: PERMANENT PACEMAKER INSERTION;  Surgeon: Sanda Klein, MD;  Location: [facility] CATH LAB;  Service: Cardiovascular;  Laterality: N/A;  . PILONIDAL CYST DRAINAGE       x2  . TEE WITHOUT CARDIOVERSION N/A 12/07/2015   Procedure: TRANSESOPHAGEAL ECHOCARDIOGRAM (TEE);  Surgeon: Pixie Casino, MD;  Location: [facility] ENDOSCOPY;  Service: Cardiovascular;  Laterality: N/A;  . TRANSTHORACIC ECHOCARDIOGRAM  10/05/2010   EF=50-55%, normal LV systolic function; LA & RA mildly dilated; mild MR; mild TR with RV systolic pressure elevted at 30-43mmHg; AV mildly sclerotic with mild calcif of AV leaflets, mild valvular AS, mild-mod regurg; trace pulm valve regurg  . US ECHOCARDIOGRAPHY  10/05/10   LA mildly dilated, mild TR, mild ca+ of AOV ,mild to mod. AI    FAMHx:  Family History  Problem Relation Age of Onset  . Sudden Cardiac Death Neg Hx     SOCHx:   reports that he has been smoking.  He has a 144.00 pack-year smoking history. he has never used smokeless tobacco. He reports that he does not drink alcohol or use drugs.  ALLERGIES:  No Known Allergies  ROS: Pertinent items noted in HPI and remainder of comprehensive ROS otherwise negative.  HOME MEDS: Current Outpatient Medications  Medication Sig Dispense Refill  . digoxin (LANOXIN) 0.125 MG tablet Take 1 tablet (0.125 mg total) daily by mouth. 30 tablet 5  . ELIQUIS 5 MG TABS tablet TAKE ONE TABLET TWICE DAILY 180 tablet 1  . Fluticasone-Umeclidin-Vilant (TRELEGY ELLIPTA) 100-62.5-25 MCG/INH AEPB Inhale 1 puff into the lungs daily. 30 each 0  . furosemide (LASIX) 40 MG tablet TAKE ONE (1) TABLET EACH DAY 30 tablet 11  . guaiFENesin (MUCINEX) 600 MG 12 hr tablet Take 1 tablet (600 mg total) by mouth 2 (two) times daily. 60 tablet 0  . metoprolol succinate (TOPROL-XL) 50 MG 24 hr tablet Take 50 mg daily by mouth. Take with or immediately following a meal.    . mirtazapine (REMERON) 30 MG tablet Take 30 mg by mouth at bedtime.     . tamsulosin (FLOMAX) 0.4 MG CAPS capsule Take 0.4 mg by mouth every evening.     . vitamin B-12 (CYANOCOBALAMIN) 1000 MCG tablet Take 1,000 mcg by mouth daily.    Marland Kitchen VITAMIN C,  CALCIUM ASCORBATE, PO Take 1,000 mg by mouth daily.     Marland Kitchen VITAMIN D, CHOLECALCIFEROL, PO Take 1,000 mg by mouth daily.     . sacubitril-valsartan (ENTRESTO) 24-26 MG Take 1 tablet by mouth 2 (two) times daily. 60 tablet 0   No current facility-administered medications for this visit.     LABS/IMAGING: No results found for this or any previous visit (from the past 48 hour(s)). No results found.  VITALS: BP 124/69   Pulse 82   Ht 5\' 8"  (1.727 m)   Wt 146 lb 6.4 oz (66.4 kg)   SpO2 96%   BMI 22.26 kg/m   EXAM: General appearance: alert and fatigued Neck: no carotid bruit, no JVD and thyroid not enlarged, symmetric, no tenderness/mass/nodules Lungs: clear to auscultation bilaterally Heart: irregularly irregular rhythm, S1, S2 normal, systolic murmur: systolic ejection 2/6, crescendo at 2nd right intercostal space and possibly a diastolic mumur Abdomen: soft, non-tender; bowel sounds normal; no masses,  no organomegaly Extremities: extremities normal, atraumatic, no cyanosis or edema and venous stasis dermatitis noted Pulses: 2+ and symmetric Skin: Skin color, texture, turgor normal. No rashes or lesions Neurologic: Grossly normal Psych: Pleasant  EKG: Deferred  ASSESSMENT: 1. Acute on chronic systolic congestive heart failure, LVEF 20-25% (improved to 40% - 2018) 2. Persistent atrial fibrillation - s/p DCCV (intolerant to amiodarone) 3. Status post St. Jude Accent DR pacemaker for sinus arrest - VVIR 4. Labile hypertension - controlled 5. Chronic anticoagulation on Eliquis 6. COPD 7. Aortic stenosis - mild to moderate (03/2016) 8. Fatigue  PLAN: 1.   Cody George he needs to have fatigue and exercise intolerance with mild shortness of breath, likely related to congestive heart failure, chronic atrial fibrillation and aortic stenosis.  Review of his medications indicates he is not optimized on heart failure medications.  His creatinine is normal and would likely tolerate the  addition of Entresto.  We will add that to his current medication regimen, specifically as he may be carrying extra intravascular volume.  BNP was very high.  Follow-up with me in a month to see if this is improved his symptoms.  If not I would consider referral to our advanced heart failure clinic to see if they can additionally assist with his worsening fatigue.  Pixie Casino, MD, [facility], [place] Director of the Advanced Lipid Disorders &  Cardiovascular Risk Reduction Clinic Attending Cardiologist  Direct Dial: [phone]  Fax: [phone]  Website:  [email or web address] 02/13/2017, 4:18 PM

## 2017-02-15 ENCOUNTER — Other Ambulatory Visit (HOSPITAL_COMMUNITY): Payer: Self-pay | Admitting: Adult Health Nurse Practitioner

## 2017-02-15 ENCOUNTER — Ambulatory Visit (HOSPITAL_COMMUNITY)
Admission: RE | Admit: 2017-02-15 | Discharge: 2017-02-15 | Disposition: A | Discharge status: Home / Self Care | Payer: Medicare Other | Source: Ambulatory Visit | Attending: Internal Medicine | Admitting: Internal Medicine

## 2017-02-15 ENCOUNTER — Other Ambulatory Visit (HOSPITAL_COMMUNITY): Payer: Self-pay | Admitting: Internal Medicine

## 2017-02-15 DIAGNOSIS — J1100 Influenza due to unidentified influenza virus with unspecified type of pneumonia: Secondary | ICD-10-CM

## 2017-02-15 DIAGNOSIS — J189 Pneumonia, unspecified organism: Secondary | ICD-10-CM | POA: Diagnosis not present

## 2017-02-15 DIAGNOSIS — M4854XA Collapsed vertebra, not elsewhere classified, thoracic region, initial encounter for fracture: Secondary | ICD-10-CM | POA: Insufficient documentation

## 2017-02-15 DIAGNOSIS — M954 Acquired deformity of chest and rib: Secondary | ICD-10-CM | POA: Insufficient documentation

## 2017-02-15 DIAGNOSIS — R05 Cough: Secondary | ICD-10-CM | POA: Diagnosis not present

## 2017-02-17 DIAGNOSIS — Z712 Person consulting for explanation of examination or test findings: Secondary | ICD-10-CM | POA: Diagnosis not present

## 2017-02-28 ENCOUNTER — Telehealth: Payer: Self-pay | Admitting: Internal Medicine

## 2017-02-28 MED ORDER — SACUBITRIL-VALSARTAN 24-26 MG PO TABS: 1.0000 | ORAL_TABLET | Freq: Two times a day (BID) | ORAL | 0 refills | Status: DC

## 2017-02-28 NOTE — Telephone Encounter (Signed)
SPOKE LORI , SAMPLES GIVEN  X 1 BOX.   NO COUPON GIVEN Daughter states the patient is doing well on medications  "I think he is feeling better"   informed patient that a prescription will have to be sent to pharmacy to see if medication is approved or prior authorization if needed.  NEED TO CONTACT OFFICE IF CAN'TAFFORD Will defer to to DR HILTY NURSE

## 2017-02-28 NOTE — Telephone Encounter (Signed)
New Message   Patient calling the office for samples of medication:   1.  What medication and dosage are you requesting samples for? sacubitril-valsartan (ENTRESTO) 24-26 MG  2.  Are you currently out of this medication? Yes  Cecille Rubin patients daughter is requesting some samples of the medication. She also states that Dr. Debara Pickett advised her that he may have some coupons for use so that they can get refills at the drug store. Please call.

## 2017-03-03 NOTE — Telephone Encounter (Signed)
Cody George - listed on file. They do not fill Entresto for patient. Will wait for patient/family to return call if further assistance is needed w/medication.

## 2017-03-08 ENCOUNTER — Telehealth: Payer: Self-pay | Admitting: Internal Medicine

## 2017-03-08 MED ORDER — SACUBITRIL-VALSARTAN 24-26 MG PO TABS: 1.0000 | ORAL_TABLET | Freq: Two times a day (BID) | ORAL | 6 refills | Status: DC

## 2017-03-08 NOTE — Telephone Encounter (Signed)
New message  Pt daughter verbalized that she is calling for the RN  To see if pt can get a discount on Entresto or if   something can be done to see if pt can afford it    *STAT* If patient is at the pharmacy, call can be transferred to refill team.   1. Which medications need to be refilled? (please list name of each medication and dose if known) sacubitril-valsartan (ENTRESTO) 24-26 MG 2. Which pharmacy/location (including street and city if local pharmacy) is medication to be sent to? [address] street name unknown [phone] 3. Do they need a 30 day or 90 day supply? [place]

## 2017-03-08 NOTE — Telephone Encounter (Signed)
Returned call to patient's daughter Cecille Rubin.Stated father is doing so much better since he started taking Entresto.Stated he needs refill sent to pharmacy.Stated she will call back if unable to afford.

## 2017-03-09 ENCOUNTER — Telehealth: Payer: Self-pay | Admitting: Internal Medicine

## 2017-03-09 NOTE — Telephone Encounter (Signed)
New message     Patient daughter returning call to discuss Entresto.  Pt c/o medication issue:  1. Name of Medication: sacubitril-valsartan (ENTRESTO) 24-26 MG  2. How are you currently taking this medication (dosage and times per day)? As prescribed  3. Are you having a reaction (difficulty breathing--STAT)? No  4. What is your medication issue? Too costly

## 2017-03-09 NOTE — Telephone Encounter (Signed)
Returned call to daughter Cecille Rubin. Pharmacist told her that patient would hit the donut hold in May 2019. Advised we can try patient assistance for both eliquis & entresto. She will pick up application for her dad (& mom who is a patient of Dr. Stanford Breed) tomorrow along with entresto samples  Medication samples have been provided to the patient.  Drug name: entrestro 24-26mg   Qty: 2 boxes  LOT: [id number]  Exp.Date: 07/2019

## 2017-03-14 ENCOUNTER — Ambulatory Visit (INDEPENDENT_AMBULATORY_CARE_PROVIDER_SITE_OTHER): Payer: Medicare Other | Admitting: *Deleted

## 2017-03-14 DIAGNOSIS — I495 Sick sinus syndrome: Secondary | ICD-10-CM

## 2017-03-14 NOTE — Progress Notes (Signed)
Remote pacemaker transmission.   

## 2017-03-16 ENCOUNTER — Encounter: Payer: Self-pay | Admitting: Cardiology

## 2017-03-17 ENCOUNTER — Ambulatory Visit: Payer: Medicare Other | Admitting: Internal Medicine

## 2017-03-17 ENCOUNTER — Encounter: Payer: Self-pay | Admitting: Internal Medicine

## 2017-03-17 ENCOUNTER — Telehealth: Payer: Self-pay | Admitting: Internal Medicine

## 2017-03-17 VITALS — BP 136/74 | HR 64 | Ht 69.0 in | Wt 157.6 lb

## 2017-03-17 DIAGNOSIS — I4819 Other persistent atrial fibrillation: Secondary | ICD-10-CM

## 2017-03-17 DIAGNOSIS — Z950 Presence of cardiac pacemaker: Secondary | ICD-10-CM | POA: Diagnosis not present

## 2017-03-17 DIAGNOSIS — I5023 Acute on chronic systolic (congestive) heart failure: Secondary | ICD-10-CM

## 2017-03-17 DIAGNOSIS — I481 Persistent atrial fibrillation: Secondary | ICD-10-CM

## 2017-03-17 DIAGNOSIS — Z7901 Long term (current) use of anticoagulants: Secondary | ICD-10-CM

## 2017-03-17 MED ORDER — SACUBITRIL-VALSARTAN 49-51 MG PO TABS: 1.0000 | ORAL_TABLET | Freq: Two times a day (BID) | ORAL | 5 refills | Status: DC

## 2017-03-17 NOTE — Progress Notes (Signed)
OFFICE NOTE  Chief Complaint:  Appears much better today  Primary Care Physician: Celene Squibb, MD  HPI:  Cody George is an 84 year old gentleman who has a history of atrial fibrillation and tachy-brady syndrome as well as paroxysmal atrial fibrillation and sinus node arrest. He underwent a St. Jude Accent DR dual-chamber pacemaker in June of 2013. He has done fairly well other than he had some hypotension on lisinopril which was discontinued. He has recently had some tachyarrhythmias and had increased his metoprolol succinate to 25 mg daily due to tachyarrhythmias. He is enrolled in a home monitoring service with the [facility] patient care network. He was successfully transitioned over to Eliquis she is taking without any bleeding difficulty. He denies any shortness of breath or worsening chest pain. He is due for a remote pacer check in a few days.  Mr. Cody George returns for follow-up today. He is feeling well denies any chest pain or worsening shortness of breath. He has no palpitations. Blood pressure is well controlled. He had recent bilateral cataract surgery and is seeing much better. There are no bleeding issues.  I saw Mr. Cody George back in the office today for follow-up. Tells me a few months ago he had pneumonia and has been very slow to recover. Chest x-ray shows improvement however there are changes consistent with COPD. He has a greater than 65 year history of smoking. I do not believe he carries a diagnosis of COPD and is currently not on any medications for this. He reports some worsening shortness of breath as well has fatigue. He denies any particular chest pain. He does have valvular heart disease and history of aortic stenosis. Recent echo in March shows some worsening of valvular function with now moderate aortic stenosis.  [name] returns today for follow-up. Overall he is without complaints. He scheduled to see Dr. Loletha Grayer in the office next week for follow-up of his  pacemaker. He is atrially paced today. He had a low burden of atrial fibrillation but remains on Eliquis. He is asymptomatic with this. He's had no bleeding problems on Eliquis. Blood pressure is high normal today 152/82. He says that generally is between 893 and 810 systolic at home. I reminded him that his prior echocardiogram showed what may be moderate aortic stenosis. Interestingly, I have not been able to auscultate that on exam. His prior echo was almost a year ago and will be due for repeat echo this year.  11/04/2015  Mr. Cody George was seen today in the office. He reports that over the past several months she's had a significant decline. He has had worsening energy and fatigue as well as shortness of breath. He can only walk short distances before becoming significantly short of breath. He is also developed a mildly productive cough with a tannish sputum. He is concerned about whether he might have had recurrent pneumonia. He does have a history of COPD and pneumonia in the past. An echocardiogram was performed and March 2017 which showed mild to moderate aortic stenosis by valve gradient however visually the aortic valve appeared to be moderate to severely stenosed. On exam is very difficult with his COPD and hyperexpansion to auscultate his aortic murmur and I cannot reliably hear the second heart sound. Mr. Cody George appeared to be short of breath today and was unable to finish sentences at times. He denies any fevers or chills at home. He has had some mild recent weight loss. He also complains of lower extremity  swelling, which is worse on the left leg than the right. There is some orthopnea.  11/16/2015  Mr. Cody George returns today for follow-up. He reports feeling much better. His weight has come down several pounds and he notes marked improvement in his edema. His shortness of breath is also improved and he feels much more energized. Unfortunately his echocardiogram shows a newly reduced LVEF of  20-25% with global hypokinesis. 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 His chest x-ray showed no evidence of pneumonia.  01/06/2016  Mr. Cody George was seen today in follow-up. He underwent cardioversion after loading with amiodarone which required 3 shocks but ultimately he converted back to sinus rhythm. Today he is in an atrial paced rhythm but appears to be maintaining sinus. Unfortunately he feels "horrible". It's not clear why he feels worse today but has felt better in the past. He says some days are good and some days are bad. He also became very tearful in the office today. He is still quite upset about the death of a family member last year. I'm concerned that he is struggling from depression. He does not appear volume overloaded on exam. Now that he is back in sinus rhythm, his cardiac output should be better but he still reports fatigue, poor sleep at night, lack of interest in things, tearfulness and other symptoms which may be more likely to be depression.  04/13/2016  Mr. Cody George returns today for follow-up. He is doing markedly better. His color is improved significantly and so is his attitude. He has generally maintained a sinus rhythm since cardioversion on 12/07/2015. He was on amiodarone but had significant side effects with this. The medication was then wean down and discontinued. His overall burden of atrial fibrillation was about 27%. In general it seems like his A. fib may be playing a role in his fatigue. He recently has been describing some left upper extremity swelling. He saw Dr. Loletha Grayer, who felt that this could be occlusion of the left subclavian vein related to the pacemaker leads with possibly super impose thrombus. Today he reports is been no significant change in his swelling despite being on Eliquis for more than 3 months. He has been trying to keep  his arm elevated.  10/11/2016  Mr. Cody George returns today for follow-up. He recently saw Dr. Loletha Grayer for interrogation of his pacemaker. He had an echo in February which showed improvement in EF from 20-25% up to 40%. He says he feels great and in fact if he didn't have ongoing back pain he be in very good shape. He's had resolution of his upper extremity swelling. He does get some right lower extremity swelling. He has hemosiderin deposition of the right lower extremity and some superficial varicosities suggesting poor venous return. He notes marked improvement when elevating his foot on aggressive suspect this is venous insufficiency. He may benefit from wearing compression stocking. He denies any further syncope. He has no chest pain or worsening shortness of breath.  12/22/2016  Mr. Hattabaugh returns today for hospital follow-up.  He was recently hospitalized in any pain in [place] for A. fib with RVR and acute congestive heart failure.  He required IV diuretics and medication adjustment for rate control.  He lost about 10 pounds with diuresis and says his breathing is better however he feels very fatigued and has almost no energy.  On discharge, surprisingly he was told to increase his Toprol-XL  from 50 mg daily to 300 mg or 3 tablets twice daily.  Passive dose change and I suspect may be a medication error.  He said he took this for a couple days and felt terrible and then went back to taking Toprol-XL 50 mg daily.  On the day prior to discharge his schedule his meds indicated he was taking metoprolol succinate 150 mg twice daily, so it seems that he was on this increased dose.  Dr. Domenic Polite had recommended increasing the dose to 100 in the morning and 50 at night.  He was also started on digoxin.  Heart rate and blood pressure appear normal today.  02/13/2017  Mr. Burnley returns today for follow-up.  He continues to feel fatigued.  He says his energy level is not good.  He is in more persistent atrial  fibrillation and recently his pacemaker was changed to VVIR.  I did lab work which indicates no evidence of anemia however BNP is elevated.  Creatinine is stable.  His LVEF has declined to 30%.  He recently had an elevated digoxin level and I decreased his digoxin intake.  His repeat level was 0.7.  03/17/2017  Mr. Marcin returns today for follow-up.  He actually looks great today.  He is much more alert and awake.  He is more in a joking mood.  He has had about 11 pound weight gain since I saw him however he had lost a lot of weight and still is within normal limits.  He says he feels much better and denies any shortness of breath.  There is no evidence of any edema.  I suspect that his appetite is improved and he reported that is the case.  He has been taking the Entresto now for over a month and feels like it is helping him.  We discussed the importance of trying to increase the dose as tolerated.  He has at least another month of samples which time we will hopefully step him up to the 49/51 mg dose.  He had a recent remote patient check which was normal.  PMHx:  Past Medical History:  Diagnosis Date  . Acute systolic congestive heart failure ([place]) 12/07/2015  . Arthritis   . Cellulitis   . Community acquired pneumonia 04/14/2014  . COPD mixed type ([place]) 12/07/2016  . Deep vein thrombosis (DVT) of left upper extremity ([place]) 04/13/2016  . Depression 01/06/2016  . Dysrhythmia    PAF  . GERD (gastroesophageal reflux disease)   . History of nuclear stress test 10/05/2010   dipyridamole; normal pattern of perfusion in all regions; no significant ishcemia, low risk scan   . Hypertension   . OA (osteoarthritis)   . Pacemaker 07/22/2011   St. 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 Paroxysmal atrial fibrillation (HCC)   . Prostate cancer ([place])    radiation  . Shortness of breath     Past Surgical History:  Procedure Laterality Date  . CARDIOVERSION N/A  12/07/2015   Procedure: CARDIOVERSION;  Surgeon: Pixie Casino, MD;  Location: [facility] ENDOSCOPY;  Service: Cardiovascular;  Laterality: N/A;  . HERNIA REPAIR Bilateral   . INGUINAL HERNIA REPAIR Left 06/10/2015   Procedure: HERNIA REPAIR INGUINAL ADULT WITH MESH;  Surgeon: Aviva Signs, MD;  Location: [facility] ORS;  Service: General;  Laterality: Left;  Pt notified to arrive at 6:15am KF  . NM MYOCAR PERF WALL MOTION  10/05/10   normal  . PACEMAKER INSERTION  07/22/2011  St. 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 PERMANENT PACEMAKER INSERTION N/A 07/22/2011   Procedure: PERMANENT PACEMAKER INSERTION;  Surgeon: Sanda Klein, MD;  Location: [facility] CATH LAB;  Service: Cardiovascular;  Laterality: N/A;  . PILONIDAL CYST DRAINAGE     x2  . TEE WITHOUT CARDIOVERSION N/A 12/07/2015   Procedure: TRANSESOPHAGEAL ECHOCARDIOGRAM (TEE);  Surgeon: Pixie Casino, MD;  Location: [facility] ENDOSCOPY;  Service: Cardiovascular;  Laterality: N/A;  . TRANSTHORACIC ECHOCARDIOGRAM  10/05/2010   EF=50-55%, normal LV systolic function; LA & RA mildly dilated; mild MR; mild TR with RV systolic pressure elevted at 30-31mmHg; AV mildly sclerotic with mild calcif of AV leaflets, mild valvular AS, mild-mod regurg; trace pulm valve regurg  . US ECHOCARDIOGRAPHY  10/05/10   LA mildly dilated, mild TR, mild ca+ of AOV ,mild to mod. AI    FAMHx:  Family History  Problem Relation Age of Onset  . Sudden Cardiac Death Neg Hx     SOCHx:   reports that he has been smoking.  He has a 144.00 pack-year smoking history. he has never used smokeless tobacco. He reports that he does not drink alcohol or use drugs.  ALLERGIES:  No Known Allergies  ROS: Pertinent items noted in HPI and remainder of comprehensive ROS otherwise negative.  HOME MEDS: Current Outpatient Medications  Medication Sig Dispense Refill  . digoxin (LANOXIN) 0.125 MG tablet Take 1 tablet (0.125 mg total) daily by mouth. 30 tablet  5  . ELIQUIS 5 MG TABS tablet TAKE ONE TABLET TWICE DAILY 180 tablet 1  . Fluticasone-Umeclidin-Vilant (TRELEGY ELLIPTA) 100-62.5-25 MCG/INH AEPB Inhale 1 puff into the lungs daily. 30 each 0  . furosemide (LASIX) 40 MG tablet TAKE ONE (1) TABLET EACH DAY 30 tablet 11  . guaiFENesin (MUCINEX) 600 MG 12 hr tablet Take 1 tablet (600 mg total) by mouth 2 (two) times daily. 60 tablet 0  . metoprolol succinate (TOPROL-XL) 50 MG 24 hr tablet Take 50 mg daily by mouth. Take with or immediately following a meal.    . mirtazapine (REMERON) 30 MG tablet Take 30 mg by mouth at bedtime.     . sacubitril-valsartan (ENTRESTO) 24-26 MG Take 1 tablet by mouth 2 (two) times daily. 60 tablet 6  . tamsulosin (FLOMAX) 0.4 MG CAPS capsule Take 0.4 mg by mouth every evening.     . vitamin B-12 (CYANOCOBALAMIN) 1000 MCG tablet Take 1,000 mcg by mouth daily.    Marland Kitchen VITAMIN C, CALCIUM ASCORBATE, PO Take 1,000 mg by mouth daily.     Marland Kitchen VITAMIN D, CHOLECALCIFEROL, PO Take 1,000 mg by mouth daily.      No current facility-administered medications for this visit.     LABS/IMAGING: No results found for this or any previous visit (from the past 48 hour(s)). No results found.  VITALS: BP 136/74   Pulse 64   Ht 5\' 9"  (1.753 m)   Wt 157 lb 9.6 oz (71.5 kg)   BMI 23.27 kg/m   EXAM: General appearance: alert and no distress Neck: no carotid bruit, no JVD and thyroid not enlarged, symmetric, no tenderness/mass/nodules Lungs: clear to auscultation bilaterally Heart: irregularly irregular rhythm, S1, S2 normal, systolic murmur: systolic ejection 2/6, crescendo at 2nd right intercostal space and possibly a diastolic mumur Abdomen: soft, non-tender; bowel sounds normal; no masses,  no organomegaly Extremities: edema 1+ left ankle edema and venous stasis dermatitis noted Pulses: 2+ and symmetric Skin: Skin color, texture, turgor normal. No rashes  or lesions Neurologic: Grossly normal Psych:  Pleasant  EKG: Deferred  ASSESSMENT: 1. Acute on chronic systolic congestive heart failure, LVEF 20-25% (improved to 40% - 2018) 2. Persistent atrial fibrillation - s/p DCCV (intolerant to amiodarone) 3. Status post St. Jude Accent DR pacemaker for sinus arrest - VVIR 4. Labile hypertension - controlled 5. Chronic anticoagulation on Eliquis 6. COPD 7. Aortic stenosis - mild to moderate (03/2016) 8. Fatigue  PLAN: 1.   Mr. Sweitzer has had an amazing improvement in his symptoms on Entresto.  His mood is much better today.  His appetite is improved and he is gained weight which is dry weight.  He does not appear in any decompensated heart failure today.  He is a trace amount of edema which is venous stasis of the left ankle.  Recent remote check was unremarkable.  We talked about up titrating the dose of his Entresto.  He is currently getting samples from [place].  We will supply him with a 49/51 mg samples.  He should complete his 1 month of samples of the current dose and will step up to the new dose.  Follow-up in 3 months.  Pixie Casino, MD, [facility], [place] Director of the [facility]  Card[facility] Attending Cardiologist  Direct Dial: [phone]  Fax: [phone]  Website:  [email or web address] 03/17/2017, 8:34 AM

## 2017-03-17 NOTE — Patient Instructions (Addendum)
Your physician has recommended you make the following change in your medication:  -- INCREASE entresto to 49-51mg  twice daily  -- start this new dose when you finish the prescription/samples of entresto 24-26mg     -- 1 box of samples provided   Your physician recommends that you schedule a follow-up appointment in [place] with Dr. Debara Pickett.

## 2017-03-17 NOTE — Telephone Encounter (Signed)
New message     Patient spouse calling to discuss details of today's office visit.

## 2017-03-17 NOTE — Telephone Encounter (Signed)
Returned call to wife (ok per DPR)-discussed todays visit with Dr. Debara Pickett and the changes that were made.   She is aware and verbalized understanding.

## 2017-03-22 ENCOUNTER — Ambulatory Visit (HOSPITAL_COMMUNITY)
Admission: RE | Admit: 2017-03-22 | Discharge: 2017-03-22 | Disposition: A | Discharge status: Home / Self Care | Payer: Medicare Other | Source: Ambulatory Visit | Attending: Internal Medicine | Admitting: Internal Medicine

## 2017-03-22 ENCOUNTER — Other Ambulatory Visit (HOSPITAL_COMMUNITY): Payer: Self-pay | Admitting: Internal Medicine

## 2017-03-22 DIAGNOSIS — R918 Other nonspecific abnormal finding of lung field: Secondary | ICD-10-CM | POA: Diagnosis not present

## 2017-03-22 DIAGNOSIS — J449 Chronic obstructive pulmonary disease, unspecified: Secondary | ICD-10-CM | POA: Diagnosis not present

## 2017-03-22 DIAGNOSIS — Z8701 Personal history of pneumonia (recurrent): Secondary | ICD-10-CM

## 2017-03-22 DIAGNOSIS — C61 Malignant neoplasm of prostate: Secondary | ICD-10-CM | POA: Diagnosis not present

## 2017-03-22 DIAGNOSIS — J189 Pneumonia, unspecified organism: Secondary | ICD-10-CM | POA: Diagnosis not present

## 2017-03-27 DIAGNOSIS — J449 Chronic obstructive pulmonary disease, unspecified: Secondary | ICD-10-CM | POA: Diagnosis not present

## 2017-03-27 DIAGNOSIS — J441 Chronic obstructive pulmonary disease with (acute) exacerbation: Secondary | ICD-10-CM | POA: Diagnosis not present

## 2017-03-27 DIAGNOSIS — Z6823 Body mass index (BMI) 23.0-23.9, adult: Secondary | ICD-10-CM | POA: Diagnosis not present

## 2017-03-27 DIAGNOSIS — I509 Heart failure, unspecified: Secondary | ICD-10-CM | POA: Diagnosis not present

## 2017-03-28 ENCOUNTER — Ambulatory Visit: Payer: Medicare Other | Admitting: Internal Medicine

## 2017-03-28 ENCOUNTER — Other Ambulatory Visit (HOSPITAL_COMMUNITY): Payer: Self-pay | Admitting: Internal Medicine

## 2017-03-28 DIAGNOSIS — R918 Other nonspecific abnormal finding of lung field: Secondary | ICD-10-CM

## 2017-03-28 DIAGNOSIS — J449 Chronic obstructive pulmonary disease, unspecified: Secondary | ICD-10-CM

## 2017-03-29 LAB — CUP PACEART REMOTE DEVICE CHECK
Battery Remaining Longevity: 90 mo
Brady Statistic RV Percent Paced: 62 %
Date Time Interrogation Session: 20190129093605
Implantable Lead Implant Date: 20130607
Implantable Lead Location: 753859
Implantable Lead Location: 753860
Implantable Pulse Generator Implant Date: 20130607
Lead Channel Pacing Threshold Amplitude: 1 V
Lead Channel Pacing Threshold Pulse Width: 0.5 ms
Lead Channel Setting Pacing Amplitude: 2.5 V
Lead Channel Setting Sensing Sensitivity: 2 mV
MDC IDC LEAD IMPLANT DT: 20130607
MDC IDC MSMT BATTERY REMAINING PERCENTAGE: 81 %
MDC IDC MSMT BATTERY VOLTAGE: 2.93 V
MDC IDC MSMT LEADCHNL RV IMPEDANCE VALUE: 400 Ohm
MDC IDC MSMT LEADCHNL RV SENSING INTR AMPL: 12 mV
MDC IDC PG SERIAL: 7353349
MDC IDC SET LEADCHNL RV PACING PULSEWIDTH: 0.5 ms
Pulse Gen Model: 2210

## 2017-03-30 ENCOUNTER — Ambulatory Visit (HOSPITAL_COMMUNITY)
Admission: RE | Admit: 2017-03-30 | Discharge: 2017-03-30 | Disposition: A | Discharge status: Home / Self Care | Payer: Medicare Other | Source: Ambulatory Visit | Attending: Internal Medicine | Admitting: Internal Medicine

## 2017-03-30 DIAGNOSIS — I517 Cardiomegaly: Secondary | ICD-10-CM | POA: Diagnosis not present

## 2017-03-30 DIAGNOSIS — I712 Thoracic aortic aneurysm, without rupture: Secondary | ICD-10-CM | POA: Diagnosis not present

## 2017-03-30 DIAGNOSIS — I2510 Atherosclerotic heart disease of native coronary artery without angina pectoris: Secondary | ICD-10-CM | POA: Diagnosis not present

## 2017-03-30 DIAGNOSIS — J449 Chronic obstructive pulmonary disease, unspecified: Secondary | ICD-10-CM | POA: Diagnosis not present

## 2017-03-30 DIAGNOSIS — M4854XA Collapsed vertebra, not elsewhere classified, thoracic region, initial encounter for fracture: Secondary | ICD-10-CM | POA: Insufficient documentation

## 2017-03-30 DIAGNOSIS — R918 Other nonspecific abnormal finding of lung field: Secondary | ICD-10-CM | POA: Insufficient documentation

## 2017-03-30 DIAGNOSIS — J9819 Other pulmonary collapse: Secondary | ICD-10-CM | POA: Diagnosis not present

## 2017-03-30 DIAGNOSIS — I700 Atherosclerosis of aorta: Secondary | ICD-10-CM | POA: Diagnosis not present

## 2017-03-30 MED ORDER — IOPAMIDOL (ISOVUE-300) INJECTION 61%
75.0000 mL | Freq: Once | INTRAVENOUS | Status: AC | PRN
  Administered 2017-03-30: 75 mL via INTRAVENOUS

## 2017-04-03 ENCOUNTER — Other Ambulatory Visit: Payer: Self-pay | Admitting: Cardiovascular Disease

## 2017-04-18 ENCOUNTER — Ambulatory Visit: Payer: Medicare Other | Admitting: Urology

## 2017-04-18 DIAGNOSIS — Z8546 Personal history of malignant neoplasm of prostate: Secondary | ICD-10-CM

## 2017-04-24 ENCOUNTER — Other Ambulatory Visit: Payer: Self-pay | Admitting: Cardiovascular Disease

## 2017-04-27 ENCOUNTER — Telehealth: Payer: Self-pay | Admitting: Internal Medicine

## 2017-04-27 NOTE — Telephone Encounter (Signed)
Called patient who is unavailable. Spoke with wife Cody George (who is a patient of Dr. Stanford Breed). Their daughter Cody George brought by patient assistance applications for both [name]. The applications were completed and missing was a print out from pharmacy verifying out of pocket expenses on prescriptions. Advised Mrs. Crago to request this for both she and her husband and have this sent to or dropped off at our office.   She reports patient, Cody George, has been out of Entresto for 2 days and does not feel well. He has not called to request samples. Advised will check on samples and call them back.   No samples of patient's entresto dose available at present time - wife notified. Call made to drug rep to request samples.

## 2017-04-28 NOTE — Telephone Encounter (Signed)
Call to patient of Dr. Debara Pickett. Explained that he has an active Rx at [facility]. He states he has not picked up an Rx and ran out of samples so he did not pick up Rx and thus ran out of medication. Explained that I checked on covermymeds.com and his co-pay should be about $45/month, which he states does sound familiar.. And he also thinks that is his co-pay for Eliquis. Informed him that he/family member can pick up samples but stressed importance of compliance with this medication since it is helping his symptoms.   Lot: [id number] Exp: Jan 2021 Two boxes = 4 weeks

## 2017-05-01 NOTE — Telephone Encounter (Addendum)
entresto patient assistance application has been faxed to [phone].   eliquis patient assistance application has been faxed to [phone].

## 2017-05-12 ENCOUNTER — Telehealth: Payer: Self-pay | Admitting: Internal Medicine

## 2017-05-12 NOTE — Telephone Encounter (Addendum)
Left message for patient to call back in regards to info below:  Received fax from [employer] Patient [employer] concerning application for Entresto. Per the fax, based on the info the patient provided in the application, he may meet criteria for Medicare LIS (low income subsidy, which per Raquel RPH if he is approved, he can get brand name medications for around $5). The patient/family member will need to call [phone]. Per the fax, the patient can received up to to a 3 month supply of Entresto from the [employer] Patient [place] ([employer]). If the patient applies for LIS and is denied eligibility, a copy of the denial letter will need to be submitted to [employer] to then become/remain eligible for assistance thru them.   Fax for denial letter: [phone] Mail: [place] [address] Phone: [phone]

## 2017-05-12 NOTE — Telephone Encounter (Signed)
New message    Is patient in a nursing home that you know of ?

## 2017-05-12 NOTE — Telephone Encounter (Signed)
Returned call concerning patient assistance application. Advised Eliquis patient assistance staff that patient is NOT in a nursing home, is being treated as an outpatient by Dr. Debara Pickett

## 2017-05-15 NOTE — Telephone Encounter (Signed)
Patient call. Made aware of update from [employer] patient assistance program in regards to Medicare LIS. He took down phone #. He will have daughter Cecille Rubin call for more info.

## 2017-05-15 NOTE — Telephone Encounter (Signed)
Spoke with daughter Cecille Rubin and explained process for LIS and advised they call this # and also [employer] for up to 3 month supply of Entresto while waiting on approval of Medicare LIS. Advised if approved for LIS, it may also cover Eliquis for patient as well.

## 2017-05-22 NOTE — Telephone Encounter (Signed)
Patient's eliquis patient assistance application for Eliquis has been approved from 05/12/17 - 02/13/18

## 2017-05-29 DIAGNOSIS — H6121 Impacted cerumen, right ear: Secondary | ICD-10-CM | POA: Diagnosis not present

## 2017-05-29 DIAGNOSIS — J449 Chronic obstructive pulmonary disease, unspecified: Secondary | ICD-10-CM | POA: Diagnosis not present

## 2017-06-01 NOTE — Telephone Encounter (Signed)
Received fax notification that patient has been approved to receive Entresto from [employer] at no cost for the remainder of 2019.

## 2017-06-13 ENCOUNTER — Ambulatory Visit (INDEPENDENT_AMBULATORY_CARE_PROVIDER_SITE_OTHER): Payer: Medicare Other | Admitting: *Deleted

## 2017-06-13 DIAGNOSIS — I495 Sick sinus syndrome: Secondary | ICD-10-CM | POA: Diagnosis not present

## 2017-06-13 NOTE — Progress Notes (Signed)
Remote pacemaker transmission.   

## 2017-06-14 ENCOUNTER — Encounter: Payer: Self-pay | Admitting: Cardiology

## 2017-06-14 ENCOUNTER — Ambulatory Visit: Payer: Medicare Other | Admitting: Internal Medicine

## 2017-06-14 ENCOUNTER — Encounter: Payer: Self-pay | Admitting: Internal Medicine

## 2017-06-14 VITALS — BP 114/70 | HR 70 | Ht 69.0 in | Wt 156.2 lb

## 2017-06-14 DIAGNOSIS — I482 Chronic atrial fibrillation: Secondary | ICD-10-CM

## 2017-06-14 DIAGNOSIS — Z950 Presence of cardiac pacemaker: Secondary | ICD-10-CM

## 2017-06-14 DIAGNOSIS — I4821 Permanent atrial fibrillation: Secondary | ICD-10-CM

## 2017-06-14 DIAGNOSIS — I5022 Chronic systolic (congestive) heart failure: Secondary | ICD-10-CM

## 2017-06-14 DIAGNOSIS — I428 Other cardiomyopathies: Secondary | ICD-10-CM

## 2017-06-14 NOTE — Progress Notes (Signed)
OFFICE NOTE  Chief Complaint:  Occasional shortness of breath  Primary Care Physician: Celene Squibb, MD  HPI:  Cody George is an 84 year old gentleman who has a history of atrial fibrillation and tachy-brady syndrome as well as paroxysmal atrial fibrillation and sinus node arrest. He underwent a St. Jude Accent DR dual-chamber pacemaker in June of 2013. He has done fairly well other than he had some hypotension on lisinopril which was discontinued. He has recently had some tachyarrhythmias and had increased his metoprolol succinate to 25 mg daily due to tachyarrhythmias. He is enrolled in a home monitoring service with the [facility] patient care network. He was successfully transitioned over to Eliquis she is taking without any bleeding difficulty. He denies any shortness of breath or worsening chest pain. He is due for a remote pacer check in a few days.  Cody George returns for follow-up today. He is feeling well denies any chest pain or worsening shortness of breath. He has no palpitations. Blood pressure is well controlled. He had recent bilateral cataract surgery and is seeing much better. There are no bleeding issues.  I saw Cody George back in the office today for follow-up. Tells me a few months ago he had pneumonia and has been very slow to recover. Chest x-ray shows improvement however there are changes consistent with COPD. He has a greater than 65 year history of smoking. I do not believe he carries a diagnosis of COPD and is currently not on any medications for this. He reports some worsening shortness of breath as well has fatigue. He denies any particular chest pain. He does have valvular heart disease and history of aortic stenosis. Recent echo in March shows some worsening of valvular function with now moderate aortic stenosis.  Cody George returns today for follow-up. Overall he is without complaints. He scheduled to see Dr. Loletha Grayer in the office next week for follow-up of his  pacemaker. He is atrially paced today. He had a low burden of atrial fibrillation but remains on Eliquis. He is asymptomatic with this. He's had no bleeding problems on Eliquis. Blood pressure is high normal today 152/82. He says that generally is between 124 and 580 systolic at home. I reminded him that his prior echocardiogram showed what may be moderate aortic stenosis. Interestingly, I have not been able to auscultate that on exam. His prior echo was almost a year ago and will be due for repeat echo this year.  11/04/2015  Cody George was seen today in the office. He reports that over the past several months she's had a significant decline. He has had worsening energy and fatigue as well as shortness of breath. He can only walk short distances before becoming significantly short of breath. He is also developed a mildly productive cough with a tannish sputum. He is concerned about whether he might have had recurrent pneumonia. He does have a history of COPD and pneumonia in the past. An echocardiogram was performed and March 2017 which showed mild to moderate aortic stenosis by valve gradient however visually the aortic valve appeared to be moderate to severely stenosed. On exam is very difficult with his COPD and hyperexpansion to auscultate his aortic murmur and I cannot reliably hear the second heart sound. Cody George appeared to be short of breath today and was unable to finish sentences at times. He denies any fevers or chills at home. He has had some mild recent weight loss. He also complains of lower extremity  swelling, which is worse on the left leg than the right. There is some orthopnea.  11/16/2015  Cody George returns today for follow-up. He reports feeling much better. His weight has come down several pounds and he notes marked improvement in his edema. His shortness of breath is also improved and he feels much more energized. Unfortunately his echocardiogram shows a newly reduced LVEF of  20-25% with global hypokinesis. 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 His chest x-ray showed no evidence of pneumonia.  01/06/2016  Cody George was seen today in follow-up. He underwent cardioversion after loading with amiodarone which required 3 shocks but ultimately he converted back to sinus rhythm. Today he is in an atrial paced rhythm but appears to be maintaining sinus. Unfortunately he feels "horrible". It's not clear why he feels worse today but has felt better in the past. He says some days are good and some days are bad. He also became very tearful in the office today. He is still quite upset about the death of a family member last year. I'm concerned that he is struggling from depression. He does not appear volume overloaded on exam. Now that he is back in sinus rhythm, his cardiac output should be better but he still reports fatigue, poor sleep at night, lack of interest in things, tearfulness and other symptoms which may be more likely to be depression.  04/13/2016  Cody George returns today for follow-up. He is doing markedly better. His color is improved significantly and so is his attitude. He has generally maintained a sinus rhythm since cardioversion on 12/07/2015. He was on amiodarone but had significant side effects with this. The medication was then wean down and discontinued. His overall burden of atrial fibrillation was about 27%. In general it seems like his A. fib may be playing a role in his fatigue. He recently has been describing some left upper extremity swelling. He saw Dr. Loletha Grayer, who felt that this could be occlusion of the left subclavian vein related to the pacemaker leads with possibly super impose thrombus. Today he reports is been no significant change in his swelling despite being on Eliquis for more than 3 months. He has been trying to keep  his arm elevated.  10/11/2016  Cody George returns today for follow-up. He recently saw Dr. Loletha Grayer for interrogation of his pacemaker. He had an echo in February which showed improvement in EF from 20-25% up to 40%. He says he feels great and in fact if he didn't have ongoing back pain he be in very good shape. He's had resolution of his upper extremity swelling. He does get some right lower extremity swelling. He has hemosiderin deposition of the right lower extremity and some superficial varicosities suggesting poor venous return. He notes marked improvement when elevating his foot on aggressive suspect this is venous insufficiency. He may benefit from wearing compression stocking. He denies any further syncope. He has no chest pain or worsening shortness of breath.  12/22/2016  Cody George returns today for hospital follow-up.  He was recently hospitalized in any pain in [place] for A. fib with RVR and acute congestive heart failure.  He required IV diuretics and medication adjustment for rate control.  He lost about 10 pounds with diuresis and says his breathing is better however he feels very fatigued and has almost no energy.  On discharge, surprisingly he was told to increase his Toprol-XL  from 50 mg daily to 300 mg or 3 tablets twice daily.  Passive dose change and I suspect may be a medication error.  He said he took this for a couple days and felt terrible and then went back to taking Toprol-XL 50 mg daily.  On the day prior to discharge his schedule his meds indicated he was taking metoprolol succinate 150 mg twice daily, so it seems that he was on this increased dose.  Dr. Domenic Polite had recommended increasing the dose to 100 in the morning and 50 at night.  He was also started on digoxin.  Heart rate and blood pressure appear normal today.  02/13/2017  Cody George returns today for follow-up.  He continues to feel fatigued.  He says his energy level is not good.  He is in more persistent atrial  fibrillation and recently his pacemaker was changed to VVIR.  I did lab work which indicates no evidence of anemia however BNP is elevated.  Creatinine is stable.  His LVEF has declined to 30%.  He recently had an elevated digoxin level and I decreased his digoxin intake.  His repeat level was 0.7.  03/17/2017  Cody George returns today for follow-up.  He actually looks great today.  He is much more alert and awake.  He is more in a joking mood.  He has had about 11 pound weight gain since I saw him however he had lost a lot of weight and still is within normal limits.  He says he feels much better and denies any shortness of breath.  There is no evidence of any edema.  I suspect that his appetite is improved and he reported that is the case.  He has been taking the Entresto now for over a month and feels like it is helping him.  We discussed the importance of trying to increase the dose as tolerated.  He has at least another month of samples which time we will hopefully step him up to the 49/51 mg dose.  He had a recent remote patient check which was normal.  06/14/2017  Cody George is seen today in follow-up.  He seems to be in a really good mood today and is accompanied by another 1 of his daughters.  He denies any chest pain or shortness of breath however his daughter noted that he has been complaining of shortness of breath at home.  Interestingly she says that he is not been compliant with his inhaler for COPD.  That certainly could explain things.  He seems to be taking the Entresto, though blood pressure is well controlled today.  Weight has been stable and is within a pound of his weight 3 months ago.  He denies any bleeding problems on Eliquis.  Recent remote check of his device shows normal function.  He is in permanent atrial fibrillation.  PMHx:  Past Medical History:  Diagnosis Date  . Acute systolic congestive heart failure ([place]) 12/07/2015  . Arthritis   . Cellulitis   . Community  acquired pneumonia 04/14/2014  . COPD mixed type ([place]) 12/07/2016  . Deep vein thrombosis (DVT) of left upper extremity ([place]) 04/13/2016  . Depression 01/06/2016  . Dysrhythmia    PAF  . GERD (gastroesophageal reflux disease)   . History of nuclear stress test 10/05/2010   dipyridamole; normal pattern of perfusion in all regions; no significant ishcemia, low risk scan   . Hypertension   . OA (osteoarthritis)   . Pacemaker 07/22/2011   St.  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 Paroxysmal atrial fibrillation (H[facility])   . Prostate cancer ([place])    radiation  . Shortness of breath     Past Surgical History:  Procedure Laterality Date  . CARDIOVERSION N/A 12/07/2015   Procedure: CARDIOVERSION;  Surgeon: Pixie Casino, MD;  Location: [facility] ENDOSCOPY;  Service: Cardiovascular;  Laterality: N/A;  . HERNIA REPAIR Bilateral   . INGUINAL HERNIA REPAIR Left 06/10/2015   Procedure: HERNIA REPAIR INGUINAL ADULT WITH MESH;  Surgeon: Aviva Signs, MD;  Location: [facility] ORS;  Service: General;  Laterality: Left;  Pt notified to arrive at 6:15am KF  . NM MYOCAR PERF WALL MOTION  10/05/10   normal  . PACEMAKER INSERTION  07/22/2011   St. 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 PERMANENT PACEMAKER INSERTION N/A 07/22/2011   Procedure: PERMANENT PACEMAKER INSERTION;  Surgeon: Sanda Klein, MD;  Location: [facility] CATH LAB;  Service: Cardiovascular;  Laterality: N/A;  . PILONIDAL CYST DRAINAGE     x2  . TEE WITHOUT CARDIOVERSION N/A 12/07/2015   Procedure: TRANSESOPHAGEAL ECHOCARDIOGRAM (TEE);  Surgeon: Pixie Casino, MD;  [facility] END[facility];  Service: Cardiovascular;  Laterality: N/A;  . TRANSTHORACIC ECHOCARDIOGRAM  10/05/2010   EF=50-55%, normal LV systolic function; LA & RA mildly dilated; mild MR; mild TR with RV systolic pressure elevted at 30-70mmHg; AV mildly sclerotic with mild calcif of AV leaflets, mild valvular AS,  mild-mod regurg; trace pulm valve regurg  . US ECHOCARDIOGRAPHY  10/05/10   LA mildly dilated, mild TR, mild ca+ of AOV ,mild to mod. AI    FAMHx:  Family History  Problem Relation Age of Onset  . Sudden Cardiac Death Neg Hx     SOCHx:   reports that he has been smoking.  He has a 144.00 pack-year smoking history. He has never used smokeless tobacco. He reports that he does not drink alcohol or use drugs.  ALLERGIES:  No Known Allergies  ROS: Pertinent items noted in HPI and remainder of comprehensive ROS otherwise negative.  HOME MEDS: Current Outpatient Medications  Medication Sig Dispense Refill  . digoxin (LANOXIN) 0.125 MG tablet Take 1 tablet (0.125 mg total) daily by mouth. 30 tablet 5  . ELIQUIS 5 MG TABS tablet TAKE ONE TABLET BY MOUTH TWICE A DAY 180 tablet 1  . Fluticasone-Umeclidin-Vilant (TRELEGY ELLIPTA) 100-62.5-25 MCG/INH AEPB Inhale 1 puff into the lungs daily. 30 each 0  . furosemide (LASIX) 40 MG tablet TAKE ONE (1) TABLET EACH DAY 30 tablet 11  . guaiFENesin (MUCINEX) 600 MG 12 hr tablet Take 1 tablet (600 mg total) by mouth 2 (two) times daily. 60 tablet 0  . metoprolol succinate (TOPROL-XL) 50 MG 24 hr tablet Take 50 mg daily by mouth. Take with or immediately following a meal.    . metoprolol succinate (TOPROL-XL) 50 MG 24 hr tablet TAKE ONE (1) TABLET BY MOUTH EVERY DAY 90 tablet 3  . mirtazapine (REMERON) 30 MG tablet Take 30 mg by mouth at bedtime.     . sacubitril-valsartan (ENTRESTO) 49-51 MG Take 1 tablet by mouth 2 (two) times daily. 60 tablet 5  . tamsulosin (FLOMAX) 0.4 MG CAPS capsule Take 0.4 mg by mouth every evening.     . vitamin B-12 (CYANOCOBALAMIN) 1000 MCG tablet Take 1,000 mcg by mouth daily.    Marland Kitchen VITAMIN C, CALCIUM ASCORBATE, PO Take 1,000 mg by mouth daily.     Marland Kitchen  VITAMIN D, CHOLECALCIFEROL, PO Take 1,000 mg by mouth daily.      No current facility-administered medications for this visit.     LABS/IMAGING: No results found for this or  any previous visit (from the past 48 hour(s)). No results found.  VITALS: BP 114/70   Pulse 70   Ht 5\' 9"  (1.753 m)   Wt 156 lb 3.2 oz (70.9 kg)   BMI 23.07 kg/m   EXAM: General appearance: alert and no distress Neck: no carotid bruit, no JVD and thyroid not enlarged, symmetric, no tenderness/mass/nodules Lungs: clear to auscultation bilaterally Heart: irregularly irregular rhythm, S1, S2 normal, systolic murmur: systolic ejection 2/6, crescendo at 2nd right intercostal space and possibly a diastolic mumur Abdomen: soft, non-tender; bowel sounds normal; no masses,  no organomegaly Extremities: edema 1+ left ankle edema and venous stasis dermatitis noted Pulses: 2+ and symmetric Skin: Skin color, texture, turgor normal. No rashes or lesions Neurologic: Grossly normal Psych: Pleasant  EKG: Ventricular paced rhythm at 70-personally reviewed  ASSESSMENT: 1. Chronic systolic congestive heart failure, LVEF 20-25% (improved to 40% - 2018) 2. Permanent atrial fibrillation - s/p DCCV (intolerant to amiodarone) 3. Status post St. Jude Accent DR pacemaker for sinus arrest - VVIR 4. Labile hypertension - controlled 5. Chronic anticoagulation on Eliquis 6. COPD -noncompliant with inhalers 7. Aortic stenosis - mild to moderate (03/2016) 8. Fatigue  PLAN: 1.   Cody George reports shortness of breath, particularly worse recently, but he is noncompliant with his inhaler for COPD.  There is also a lot of pollen and allergens in the air.  His weight is been stable and I do not suspect any worsening heart failure.  He seems to be doing okay on Eliquis.  I like to recheck an echocardiogram to see if he is had any change in LV function at his next visit in 6 months.  Pixie Casino, MD, [facility], [place] Director of the [facility]  [facility] Attending Cardiologist  Direct Dial: [phone]  Fax: [phone]    Website:  [email or web address] 06/14/2017, 10:32 AM

## 2017-06-14 NOTE — Patient Instructions (Addendum)
Your physician has requested that you have an echocardiogram @ [facility] in 6 months. Echocardiography is a painless test that uses sound waves to create images of your heart. It provides your doctor with information about the size and shape of your heart and how well your heart's chambers and valves are working. This procedure takes approximately one hour. There are no restrictions for this procedure.  Your physician wants you to follow-up in: 6 months with Dr. Debara Pickett (after echo)  You will receive a reminder letter in the mail two months in advance. If you don't receive a letter, please call our office to schedule the follow-up appointment.

## 2017-06-16 ENCOUNTER — Other Ambulatory Visit: Payer: Self-pay | Admitting: Internal Medicine

## 2017-07-04 LAB — CUP PACEART REMOTE DEVICE CHECK
Battery Remaining Percentage: 73 %
Date Time Interrogation Session: 20190430081319
Implantable Lead Implant Date: 20130607
Implantable Lead Location: 753859
Implantable Pulse Generator Implant Date: 20130607
Lead Channel Impedance Value: 400 Ohm
Lead Channel Pacing Threshold Pulse Width: 0.5 ms
Lead Channel Setting Pacing Pulse Width: 0.5 ms
MDC IDC LEAD IMPLANT DT: 20130607
MDC IDC LEAD LOCATION: 753860
MDC IDC MSMT BATTERY REMAINING LONGEVITY: 81 mo
MDC IDC MSMT BATTERY VOLTAGE: 2.92 V
MDC IDC MSMT LEADCHNL RV PACING THRESHOLD AMPLITUDE: 1 V
MDC IDC MSMT LEADCHNL RV SENSING INTR AMPL: 12 mV
MDC IDC SET LEADCHNL RV PACING AMPLITUDE: 2.5 V
MDC IDC SET LEADCHNL RV SENSING SENSITIVITY: 2 mV
MDC IDC STAT BRADY RV PERCENT PACED: 78 %
Pulse Gen Model: 2210
Pulse Gen Serial Number: 7353349

## 2017-07-12 DIAGNOSIS — E785 Hyperlipidemia, unspecified: Secondary | ICD-10-CM | POA: Diagnosis not present

## 2017-07-12 DIAGNOSIS — R918 Other nonspecific abnormal finding of lung field: Secondary | ICD-10-CM | POA: Diagnosis not present

## 2017-07-12 DIAGNOSIS — E559 Vitamin D deficiency, unspecified: Secondary | ICD-10-CM | POA: Diagnosis not present

## 2017-07-14 DIAGNOSIS — Z0000 Encounter for general adult medical examination without abnormal findings: Secondary | ICD-10-CM | POA: Diagnosis not present

## 2017-07-14 DIAGNOSIS — Z950 Presence of cardiac pacemaker: Secondary | ICD-10-CM | POA: Diagnosis not present

## 2017-07-14 DIAGNOSIS — I482 Chronic atrial fibrillation: Secondary | ICD-10-CM | POA: Diagnosis not present

## 2017-07-14 DIAGNOSIS — E46 Unspecified protein-calorie malnutrition: Secondary | ICD-10-CM | POA: Diagnosis not present

## 2017-08-14 DIAGNOSIS — H6121 Impacted cerumen, right ear: Secondary | ICD-10-CM | POA: Diagnosis not present

## 2017-09-05 DIAGNOSIS — C44229 Squamous cell carcinoma of skin of left ear and external auricular canal: Secondary | ICD-10-CM | POA: Diagnosis not present

## 2017-09-05 DIAGNOSIS — L400 Psoriasis vulgaris: Secondary | ICD-10-CM | POA: Diagnosis not present

## 2017-09-12 ENCOUNTER — Ambulatory Visit (INDEPENDENT_AMBULATORY_CARE_PROVIDER_SITE_OTHER): Payer: Medicare Other | Admitting: *Deleted

## 2017-09-12 DIAGNOSIS — I495 Sick sinus syndrome: Secondary | ICD-10-CM

## 2017-09-12 NOTE — Progress Notes (Signed)
Remote pacemaker transmission.   

## 2017-09-13 ENCOUNTER — Encounter: Payer: Self-pay | Admitting: Cardiology

## 2017-09-13 DIAGNOSIS — D519 Vitamin B12 deficiency anemia, unspecified: Secondary | ICD-10-CM | POA: Diagnosis not present

## 2017-10-11 ENCOUNTER — Encounter: Payer: Self-pay | Admitting: Cardiovascular Disease

## 2017-10-11 ENCOUNTER — Ambulatory Visit: Payer: Medicare Other | Admitting: Cardiovascular Disease

## 2017-10-11 ENCOUNTER — Telehealth: Payer: Self-pay | Admitting: Internal Medicine

## 2017-10-11 VITALS — BP 106/70 | HR 80 | Ht 69.0 in | Wt 156.0 lb

## 2017-10-11 DIAGNOSIS — Z7901 Long term (current) use of anticoagulants: Secondary | ICD-10-CM

## 2017-10-11 DIAGNOSIS — I4819 Other persistent atrial fibrillation: Secondary | ICD-10-CM

## 2017-10-11 DIAGNOSIS — I82A22 Chronic embolism and thrombosis of left axillary vein: Secondary | ICD-10-CM

## 2017-10-11 DIAGNOSIS — I481 Persistent atrial fibrillation: Secondary | ICD-10-CM

## 2017-10-11 DIAGNOSIS — I5022 Chronic systolic (congestive) heart failure: Secondary | ICD-10-CM | POA: Diagnosis not present

## 2017-10-11 DIAGNOSIS — Z950 Presence of cardiac pacemaker: Secondary | ICD-10-CM | POA: Diagnosis not present

## 2017-10-11 MED ORDER — METOPROLOL SUCCINATE ER 25 MG PO TB24: 25.0000 mg | ORAL_TABLET | Freq: Every day | ORAL | 3 refills | Status: AC

## 2017-10-11 NOTE — Progress Notes (Signed)
Patient ID: Cody George, male   DOB: 1931-02-07, 84 y.o.   MRN: [id number]    Cardiology Office Note    Date:  10/13/2017   ID:  Cody George, DOB 28-May-1930, MRN [id number]  PCP:  Celene Squibb, MD  Cardiologist:  Raliegh Ip. [name] HILTY, MD; Sanda Klein, MD   Chief Complaint  Patient presents with  . Pacemaker Check    History of Present Illness:  Cody George is a 84 y.o. male with a history of paroxysmal atrial fibrillation and sinus node arrest who received a dual-chamber pacemaker in 2013.  He has subsequently progressed to permanent atrial fibrillation.  The patient specifically denies any chest pain at rest exertion, dyspnea at rest or with exertion, orthopnea, paroxysmal nocturnal dyspnea, syncope, palpitations, focal neurological deficits, intermittent claudication, lower extremity edema, unexplained weight gain, cough, hemoptysis or wheezing.  No serious bleeding complications, falls or injuries.  Continues to smoke half a pack of cigarettes daily  Other than fatigue/lack of energy, he has no cardiac complaints.  His heart rate histogram distribution appears to be appropriate, but he does have a markedly increased frequency of ventricular pacing, up to 82%.  His device was implanted in 2013 ([place]) and still has roughly 6.5-7 years of longevity.  Echo performed in October 2018 showed moderately to severely depressed left ventricular systolic function with ejection fraction of 30% with diffuse hypokinesis and mild to moderate LVH. There is mild to moderate aortic stenosis (mean gradient 11 mmHg, valve area 1.2 cm). The left atrium was severely dilated 50 mm.   Last saw Dr. Debara Pickett in May. He felt better after discontinuing amiodarone. In fact he has no cardiac complaints whatsoever. He has not had any bleeding complications while on treatment with Eliquis.   He had isolated mild swelling in his left upper extremity and showed some evidence of collateral vein  circulation on the anterior chest and shoulder, consistent with subclavian vein occlusion, but this has resolved completely. He no longer has any swelling in the collateral veins have shrunk..  Past Medical History:  Diagnosis Date  . Acute systolic congestive heart failure ([place]) 12/07/2015  . Arthritis   . Cellulitis   . Community acquired pneumonia 04/14/2014  . COPD mixed type ([place]) 12/07/2016  . Deep vein thrombosis (DVT) of left upper extremity ([place]) 04/13/2016  . Depression 01/06/2016  . Dysrhythmia    PAF  . GERD (gastroesophageal reflux disease)   . History of nuclear stress test 10/05/2010   dipyridamole; normal pattern of perfusion in all regions; no significant ishcemia, low risk scan   . Hypertension   . OA (osteoarthritis)   . Pacemaker 07/22/2011   St. 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 Paroxysmal atrial fibrillation (HCC)   . Prostate cancer ([place])    radiation  . Shortness of breath     Past Surgical History:  Procedure Laterality Date  . CARDIOVERSION N/A 12/07/2015   Procedure: CARDIOVERSION;  Surgeon: Pixie Casino, MD;  Location: [facility] ENDOSCOPY;  Service: Cardiovascular;  Laterality: N/A;  . HERNIA REPAIR Bilateral   . INGUINAL HERNIA REPAIR Left 06/10/2015   Procedure: HERNIA REPAIR INGUINAL ADULT WITH MESH;  Surgeon: Aviva Signs, MD;  Location: [facility];  Service: General;  Laterality: Left;  Pt notified to arrive at 6:15am KF  . NM MYOCAR PERF WALL MOTION  10/05/10   normal  . PACEMAKER INSERTION  07/22/2011   St. Jude Accent DR  dual-chamber; r/t tachy-brady syndrome (PAF & sinus node arrest)  . PERMANENT PACEMAKER INSERTION N/A 07/22/2011   Procedure: PERMANENT PACEMAKER INSERTION;  Surgeon: Sanda Klein, MD;  Location: [facility] CATH LAB;  Service: Cardiovascular;  Laterality: N/A;  . PILONIDAL CYST DRAINAGE     x2  . TEE WITHOUT CARDIOVERSION N/A 12/07/2015   Procedure: TRANSESOPHAGEAL ECHOCARDIOGRAM (TEE);  Surgeon:  Pixie Casino, MD;  Location: [facility] ENDOSCOPY;  Service: Cardiovascular;  Laterality: N/A;  . TRANSTHORACIC ECHOCARDIOGRAM  10/05/2010   EF=50-55%, normal LV systolic function; LA & RA mildly dilated; mild MR; mild TR with RV systolic pressure elevted at 30-57mmHg; AV mildly sclerotic with mild calcif of AV leaflets, mild valvular AS, mild-mod regurg; trace pulm valve regurg  . US ECHOCARDIOGRAPHY  10/05/10   LA mildly dilated, mild TR, mild ca+ of AOV ,mild to mod. AI    Outpatient Medications Prior to Visit  Medication Sig Dispense Refill  . digoxin (LANOXIN) 0.125 MG tablet TAKE 1 TABLET (0.125 MG TOTAL) BY MOUTH DAILY 30 tablet 5  . ELIQUIS 5 MG TABS tablet TAKE ONE TABLET BY MOUTH TWICE A DAY 180 tablet 1  . Fluticasone-Umeclidin-Vilant (TRELEGY ELLIPTA) 100-62.5-25 MCG/INH AEPB Inhale 1 puff into the lungs daily. 30 each 0  . furosemide (LASIX) 40 MG tablet TAKE ONE (1) TABLET EACH DAY 30 tablet 11  . guaiFENesin (MUCINEX) 600 MG 12 hr tablet Take 1 tablet (600 mg total) by mouth 2 (two) times daily. 60 tablet 0  . mirtazapine (REMERON) 30 MG tablet Take 30 mg by mouth at bedtime.     . sacubitril-valsartan (ENTRESTO) 49-51 MG Take 1 tablet by mouth 2 (two) times daily. 60 tablet 5  . tamsulosin (FLOMAX) 0.4 MG CAPS capsule Take 0.4 mg by mouth every evening.     Marland Kitchen VITAMIN B1-B12 IJ Inject 1 Dose as directed every 30 (thirty) days.    Marland Kitchen VITAMIN C, CALCIUM ASCORBATE, PO Take 1,000 mg by mouth daily.     Marland Kitchen VITAMIN D, CHOLECALCIFEROL, PO Take 1,000 mg by mouth daily.     . metoprolol succinate (TOPROL-XL) 50 MG 24 hr tablet Take 50 mg daily by mouth. Take with or immediately following a meal.    . metoprolol succinate (TOPROL-XL) 50 MG 24 hr tablet TAKE ONE (1) TABLET BY MOUTH EVERY DAY 90 tablet 3  . vitamin B-12 (CYANOCOBALAMIN) 1000 MCG tablet Take 1,000 mcg by mouth daily.     No facility-administered medications prior to visit.      Allergies:   Patient has no known allergies.    Social History   Socioeconomic History  . Marital status: Married    Spouse name: Not on file  . Number of children: 5  . Years of education: Not on file  . Highest education level: Not on file  Occupational History    Employer: RETIRED  Social Needs  . Financial resource strain: Not on file  . Food insecurity:    Worry: Not on file    Inability: Not on file  . Transportation needs:    Medical: Not on file    Non-medical: Not on file  Tobacco Use  . Smoking status: Current Every Day Smoker    Packs/day: 2.00    Years: 72.00    Pack years: 144.00  . Smokeless tobacco: Never Used  . Tobacco comment: now 1/2 ppd (07/09/14)  Substance and Sexual Activity  . Alcohol use: No    Alcohol/week: 0.0 standard drinks  . Drug use: No  .  Sexual activity: Not Currently  Lifestyle  . Physical activity:    Days per week: Not on file    Minutes per session: Not on file  . Stress: Not on file  Relationships  . Social connections:    Talks on phone: Not on file    Gets together: Not on file    Attends religious service: Not on file    Active member of club or organization: Not on file    Attends meetings of clubs or organizations: Not on file    Relationship status: Not on file  Other Topics Concern  . Not on file  Social History Narrative  . Not on file     ROS:   Please see the history of present illness.    ROS All other systems are reviewed and are negative   PHYSICAL EXAM:   VS:  BP 106/70   Pulse 80   Ht 5\' 9"  (1.753 m)   Wt 156 lb (70.8 kg)   BMI 23.04 kg/m     General: Alert, oriented x3, no distress, in Head: no evidence of trauma, PERRL, EOMI, no exophtalmos or lid lag, no myxedema, no xanthelasma; normal ears, nose and oropharynx Neck: normal jugular venous pulsations and no hepatojugular reflux; brisk carotid pulses without delay and no carotid bruits Chest: clear to auscultation, no signs of consolidation by percussion or palpation, normal fremitus,  symmetrical and full respiratory excursions Cardiovascular: normal position and quality of the apical impulse, regular rhythm, normal first and paradoxically split second heart sounds, no murmurs, rubs or gallops Abdomen: no tenderness or distention, no masses by palpation, no abnormal pulsatility or arterial bruits, normal bowel sounds, no hepatosplenomegaly Extremities: no clubbing, cyanosis or edema; 2+ radial, ulnar and brachial pulses bilaterally; 2+ right femoral, posterior tibial and dorsalis pedis pulses; 2+ left femoral, posterior tibial and dorsalis pedis pulses; no subclavian or femoral bruits Neurological: grossly nonfocal Psych: Normal mood and affect   Wt Readings from Last 3 Encounters:  10/11/17 156 lb (70.8 kg)  06/14/17 156 lb 3.2 oz (70.9 kg)  03/17/17 157 lb 9.6 oz (71.5 kg)      Studies/Labs Reviewed:   EKG:  EKG is not ordered today.   Recent Labs: 11/26/2016: B Natriuretic Peptide 1,175.0 12/22/2016: ALT 11; Hemoglobin 15.0; NT-Pro BNP 6,835; Platelets 244 01/17/2017: BUN 18; Creatinine, Ser 0.82; Potassium 4.3; Sodium 135   Lipid Panel    Component Value Date/Time   CHOL 131 12/21/2012 0820   TRIG 68 12/21/2012 0820   HDL 53 12/21/2012 0820   CHOLHDL 2.5 12/21/2012 0820   VLDL 14 12/21/2012 0820   LDLCALC 64 12/21/2012 0820      ASSESSMENT:    1. Persistent atrial fibrillation ([place])   2. Chronic systolic CHF (congestive heart failure) ([place])   3. Chronic deep vein thrombosis (DVT) of axillary vein of left upper extremity (HCC)   4. Pacemaker   5. Long term (current) use of anticoagulants      PLAN:  In order of problems listed above:  1. AFIB:  He is tolerating the arrhythmia without any obvious symptoms. He definitely felt worse while he was taking antiarrhythmics.  He now appears to be in permanent atrial fibrillation and his left atrium measures 5.0 cm, making very unlikely he would return to normal rhythm. He is compliant with  anticoagulation. CHADSVasc 5 (age 49, CHF, CAD, HTN).  Since the frequency of ventricular pacing has increased substantially, we reduced the dose of his metoprolol today.  Upgrade to CRT-P remains an option if his heart failure complaints worse 2. CHF: Cause of his cardiomyopathy is uncertain.  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  Consider reevaluation of left ventricular systolic function.  Excessive right ventricular pacing can also be deleterious so we decreased the dose of metoprolol today.  He is on Entresto and I doubt he could tolerate a higher dose because of his blood pressure 3. LUE venous obstruction: This appears to have resolved based on clinical exam.. 4. PM: Remote downloads follow-up via the [facility] system every 3 months, will see him back in 12 months in the office.  Dual-chamber device programmed VVIR for persistent atrial fibrillation 5. Eliquis, well-tolerated without bleeding problems   Medication Adjustments/Labs and Tests Ordered: Current medicines are reviewed at length with the patient today.  Concerns regarding medicines are outlined above.  Medication changes, Labs and Tests ordered today are listed in the Patient Instructions below. Patient Instructions  Dr Sallyanne Kuster has recommended making the following medication changes: 1. DECREASE Metoprolol to 25 mg daily  Remote monitoring is used to monitor your Pacemaker or ICD from home. This monitoring reduces the number of office visits required to check your device to one time per year. It allows [place] to keep an eye on the functioning of your device to ensure it is working properly. You are scheduled for a device check from home on Tuesday, October 29th, 2019. You may send your transmission at any time that day. If you have a wireless device, the transmission will be sent automatically. After your physician reviews your transmission, you will  receive a notification with your next transmission date.  To improve our patient care and to more adequately follow your device, [facility] has decided, as a practice, to start following each patient four times a year with your home monitor. This means that you may experience a remote appointment that is close to an in-office appointment with your physician. Your insurance will apply at the same rate as other remote monitoring transmissions.  Dr Sallyanne Kuster recommends that you schedule a follow-up appointment in 12 months with a pacemaker check. You will receive a reminder letter in the mail two months in advance. If you don't receive a letter, please call our office to schedule the follow-up appointment.  If you need a refill on your cardiac medications before your next appointment, please call your pharmacy.      Signed, Sanda Klein, MD  10/13/2017 1:17 PM    [facility] [address], [place], [place]  [address] Phone: [phone]; Fax: [phone]

## 2017-10-11 NOTE — Telephone Encounter (Signed)
New Message:      Cody George from [place] call about the decrease in the medication: metoprolol succinate (TOPROL-XL) 25 MG 24 hr tablet

## 2017-10-11 NOTE — Patient Instructions (Signed)
Dr Sallyanne Kuster has recommended making the following medication changes: 1. DECREASE Metoprolol to 25 mg daily  Remote monitoring is used to monitor your Pacemaker or ICD from home. This monitoring reduces the number of office visits required to check your device to one time per year. It allows [place] to keep an eye on the functioning of your device to ensure it is working properly. You are scheduled for a device check from home on Tuesday, October 29th, 2019. You may send your transmission at any time that day. If you have a wireless device, the transmission will be sent automatically. After your physician reviews your transmission, you will receive a notification with your next transmission date.  To improve our patient care and to more adequately follow your device, [facility] has decided, as a practice, to start following each patient four times a year with your home monitor. This means that you may experience a remote appointment that is close to an in-office appointment with your physician. Your insurance will apply at the same rate as other remote monitoring transmissions.  Dr Sallyanne Kuster recommends that you schedule a follow-up appointment in 12 months with a pacemaker check. You will receive a reminder letter in the mail two months in advance. If you don't receive a letter, please call our office to schedule the follow-up appointment.  If you need a refill on your cardiac medications before your next appointment, please call your pharmacy.

## 2017-10-11 NOTE — Telephone Encounter (Signed)
Per their request, [place] advised that we decreased the patients metoprolol to 25mg  a day from 50mg .

## 2017-10-13 ENCOUNTER — Encounter: Payer: Self-pay | Admitting: Cardiovascular Disease

## 2017-10-13 DIAGNOSIS — D519 Vitamin B12 deficiency anemia, unspecified: Secondary | ICD-10-CM | POA: Diagnosis not present

## 2017-10-14 LAB — CUP PACEART REMOTE DEVICE CHECK
Battery Remaining Percentage: 73 %
Battery Voltage: 2.92 V
Implantable Lead Implant Date: 20130607
Implantable Lead Location: 753859
Implantable Pulse Generator Implant Date: 20130607
Lead Channel Impedance Value: 400 Ohm
Lead Channel Pacing Threshold Pulse Width: 0.5 ms
Lead Channel Sensing Intrinsic Amplitude: 12 mV
Lead Channel Setting Pacing Amplitude: 2.5 V
MDC IDC LEAD IMPLANT DT: 20130607
MDC IDC LEAD LOCATION: 753860
MDC IDC MSMT BATTERY REMAINING LONGEVITY: 81 mo
MDC IDC MSMT LEADCHNL RV PACING THRESHOLD AMPLITUDE: 1 V
MDC IDC SESS DTM: 20190730084239
MDC IDC SET LEADCHNL RV PACING PULSEWIDTH: 0.5 ms
MDC IDC SET LEADCHNL RV SENSING SENSITIVITY: 2 mV
MDC IDC STAT BRADY RV PERCENT PACED: 81 %
Pulse Gen Serial Number: 7353349

## 2017-10-26 ENCOUNTER — Other Ambulatory Visit: Payer: Self-pay | Admitting: Internal Medicine

## 2017-10-26 NOTE — Telephone Encounter (Signed)
Rx sent to pharmacy   

## 2017-10-27 ENCOUNTER — Other Ambulatory Visit: Payer: Self-pay

## 2017-10-27 MED ORDER — DIGOXIN 125 MCG PO TABS: ORAL_TABLET | ORAL | 1 refills | Status: DC

## 2017-11-13 DIAGNOSIS — D519 Vitamin B12 deficiency anemia, unspecified: Secondary | ICD-10-CM | POA: Diagnosis not present

## 2017-11-16 ENCOUNTER — Other Ambulatory Visit: Payer: Self-pay | Admitting: Internal Medicine

## 2017-11-28 ENCOUNTER — Telehealth: Payer: Self-pay | Admitting: Internal Medicine

## 2017-11-28 NOTE — Telephone Encounter (Signed)
Error no note needed °

## 2017-11-28 NOTE — Telephone Encounter (Signed)
New Message:    I tried to schedule an Echo before pt's appt in November. Pt said he was not awre that he was supposed to have an Echo. Is he supposed to have one before his appt?

## 2017-11-28 NOTE — Telephone Encounter (Signed)
Spoke with pt. Confirmed with pt that Dr.Hilty would like him to have a repeat echo in Nov 2019 and a f/u with him after. Pt sts that it is inconvenient for him to come to [place]. Pt sts that he would prefer to be seen in the [place] office. Adv pt that I will fwd the message scheduling to call him to schedule

## 2017-12-01 ENCOUNTER — Ambulatory Visit (HOSPITAL_COMMUNITY)
Admission: RE | Admit: 2017-12-01 | Discharge: 2017-12-01 | Disposition: A | Discharge status: Home / Self Care | Payer: Medicare Other | Source: Ambulatory Visit | Attending: Internal Medicine | Admitting: Internal Medicine

## 2017-12-01 DIAGNOSIS — I4819 Other persistent atrial fibrillation: Secondary | ICD-10-CM | POA: Diagnosis not present

## 2017-12-01 DIAGNOSIS — Z7901 Long term (current) use of anticoagulants: Secondary | ICD-10-CM | POA: Diagnosis not present

## 2017-12-01 DIAGNOSIS — I428 Other cardiomyopathies: Secondary | ICD-10-CM

## 2017-12-01 DIAGNOSIS — I350 Nonrheumatic aortic (valve) stenosis: Secondary | ICD-10-CM | POA: Diagnosis not present

## 2017-12-01 DIAGNOSIS — C61 Malignant neoplasm of prostate: Secondary | ICD-10-CM | POA: Insufficient documentation

## 2017-12-01 DIAGNOSIS — I495 Sick sinus syndrome: Secondary | ICD-10-CM | POA: Insufficient documentation

## 2017-12-01 DIAGNOSIS — J449 Chronic obstructive pulmonary disease, unspecified: Secondary | ICD-10-CM | POA: Insufficient documentation

## 2017-12-01 NOTE — Progress Notes (Signed)
*  PRELIMINARY RESULTS* Echocardiogram 2D Echocardiogram has been performed.  Cody George 12/01/2017, 9:08 AM

## 2017-12-04 DIAGNOSIS — J449 Chronic obstructive pulmonary disease, unspecified: Secondary | ICD-10-CM | POA: Diagnosis not present

## 2017-12-04 DIAGNOSIS — I509 Heart failure, unspecified: Secondary | ICD-10-CM | POA: Diagnosis not present

## 2017-12-04 DIAGNOSIS — I482 Chronic atrial fibrillation, unspecified: Secondary | ICD-10-CM | POA: Diagnosis not present

## 2017-12-12 ENCOUNTER — Ambulatory Visit (INDEPENDENT_AMBULATORY_CARE_PROVIDER_SITE_OTHER): Payer: Medicare Other | Admitting: *Deleted

## 2017-12-12 ENCOUNTER — Telehealth: Payer: Self-pay | Admitting: Cardiology

## 2017-12-12 DIAGNOSIS — I495 Sick sinus syndrome: Secondary | ICD-10-CM

## 2017-12-12 NOTE — Progress Notes (Signed)
Remote pacemaker transmission.   

## 2017-12-12 NOTE — Telephone Encounter (Signed)
Spoke with pt and reminded pt of remote transmission that is due today. Pt verbalized understanding.   

## 2017-12-18 ENCOUNTER — Telehealth: Payer: Self-pay | Admitting: Internal Medicine

## 2017-12-18 DIAGNOSIS — D519 Vitamin B12 deficiency anemia, unspecified: Secondary | ICD-10-CM | POA: Diagnosis not present

## 2017-12-18 NOTE — Telephone Encounter (Signed)
Faxed signed MD portion of [employer] patient assistance application for 2020 to [employer] @ [phone] 

## 2017-12-27 ENCOUNTER — Encounter: Payer: Self-pay | Admitting: Internal Medicine

## 2017-12-27 ENCOUNTER — Ambulatory Visit: Payer: Medicare Other | Admitting: Internal Medicine

## 2017-12-27 VITALS — BP 124/64 | HR 76 | Ht 69.0 in | Wt 159.0 lb

## 2017-12-27 DIAGNOSIS — Z950 Presence of cardiac pacemaker: Secondary | ICD-10-CM

## 2017-12-27 DIAGNOSIS — Z79899 Other long term (current) drug therapy: Secondary | ICD-10-CM

## 2017-12-27 DIAGNOSIS — I4821 Permanent atrial fibrillation: Secondary | ICD-10-CM | POA: Diagnosis not present

## 2017-12-27 DIAGNOSIS — Z5181 Encounter for therapeutic drug level monitoring: Secondary | ICD-10-CM

## 2017-12-27 DIAGNOSIS — I428 Other cardiomyopathies: Secondary | ICD-10-CM | POA: Diagnosis not present

## 2017-12-27 LAB — BASIC METABOLIC PANEL
BUN/Creatinine Ratio: 18 (ref 10–24)
BUN: 15 mg/dL (ref 8–27)
CHLORIDE: 96 mmol/L (ref 96–106)
CO2: 27 mmol/L (ref 20–29)
Calcium: 9.3 mg/dL (ref 8.6–10.2)
Creatinine, Ser: 0.82 mg/dL (ref 0.76–1.27)
GFR calc Af Amer: 93 mL/min/{1.73_m2} (ref >59)
GFR calc non Af Amer: 81 mL/min/{1.73_m2} (ref >59)
Glucose: 82 mg/dL (ref 65–99)
POTASSIUM: 4.4 mmol/L (ref 3.5–5.2)
SODIUM: 137 mmol/L (ref 134–144)

## 2017-12-27 LAB — DIGOXIN LEVEL: DIGOXIN, SERUM: 0.8 ng/mL (ref 0.5–0.9)

## 2017-12-27 NOTE — Patient Instructions (Signed)
Medication Instructions:  Continue current medications If you need a refill on your cardiac medications before your next appointment, please call your pharmacy.   Lab work: NONE   Testing/Procedures: NONE  Follow-Up: At [facility], you and your health needs are our priority.  As part of our continuing mission to provide you with exceptional heart care, we have created designated Provider Care Teams.  These Care Teams include your primary Cardiologist (physician) and Advanced Practice Providers (APPs -  Physician Assistants and Nurse Practitioners) who all work together to provide you with the care you need, when you need it. You will need a follow up appointment in 6 months.  Please call our office 2 months in advance to schedule this appointment.  You may see Kenneth C Hilty, MD or one of the following Advanced Practice Providers on your designated Care Team: Hao Meng, PA-C . Angela Duke, PA-C  Any Other Special Instructions Will Be Listed Below (If Applicable).    

## 2017-12-27 NOTE — Progress Notes (Signed)
OFFICE NOTE  Chief Complaint:  Feels much better  Primary Care Physician: Celene Squibb, MD  HPI:  Cody George is an 84 year old gentleman who has a history of atrial fibrillation and tachy-brady syndrome as well as paroxysmal atrial fibrillation and sinus node arrest. He underwent a St. Jude Accent DR dual-chamber pacemaker in June of 2013. He has done fairly well other than he had some hypotension on lisinopril which was discontinued. He has recently had some tachyarrhythmias and had increased his metoprolol succinate to 25 mg daily due to tachyarrhythmias. He is enrolled in a home monitoring service with the [facility] patient care network. He was successfully transitioned over to Eliquis she is taking without any bleeding difficulty. He denies any shortness of breath or worsening chest pain. He is due for a remote pacer check in a few days.  Cody George returns for follow-up today. He is feeling well denies any chest pain or worsening shortness of breath. He has no palpitations. Blood pressure is well controlled. He had recent bilateral cataract surgery and is seeing much better. There are no bleeding issues.  I saw Cody George back in the office today for follow-up. Tells me a few months ago he had pneumonia and has been very slow to recover. Chest x-ray shows improvement however there are changes consistent with COPD. He has a greater than 65 year history of smoking. I do not believe he carries a diagnosis of COPD and is currently not on any medications for this. He reports some worsening shortness of breath as well has fatigue. He denies any particular chest pain. He does have valvular heart disease and history of aortic stenosis. Recent echo in March shows some worsening of valvular function with now moderate aortic stenosis.  Cody George returns today for follow-up. Overall he is without complaints. He scheduled to see Dr. Loletha George in the office next week for follow-up of his pacemaker. He  is atrially paced today. He had a low burden of atrial fibrillation but remains on Eliquis. He is asymptomatic with this. He's had no bleeding problems on Eliquis. Blood pressure is high normal today 152/82. He says that generally is between 572 and 620 systolic at home. I reminded him that his prior echocardiogram showed what may be moderate aortic stenosis. Interestingly, I have not been able to auscultate that on exam. His prior echo was almost a year ago and will be due for repeat echo this year.  11/04/2015  Cody George was seen today in the office. He reports that over the past several months she's had a significant decline. He has had worsening energy and fatigue as well as shortness of breath. He can only walk short distances before becoming significantly short of breath. He is also developed a mildly productive cough with a tannish sputum. He is concerned about whether he might have had recurrent pneumonia. He does have a history of COPD and pneumonia in the past. An echocardiogram was performed and March 2017 which showed mild to moderate aortic stenosis by valve gradient however visually the aortic valve appeared to be moderate to severely stenosed. On exam is very difficult with his COPD and hyperexpansion to auscultate his aortic murmur and I cannot reliably hear the second heart sound. Cody George appeared to be short of breath today and was unable to finish sentences at times. He denies any fevers or chills at home. He has had some mild recent weight loss. He also complains of lower extremity swelling,  which is worse on the left leg than the right. There is some orthopnea.  11/16/2015  Cody George returns today for follow-up. He reports feeling much better. His weight has come down several pounds and he notes marked improvement in his edema. His shortness of breath is also improved and he feels much more energized. Unfortunately his echocardiogram shows a newly reduced LVEF of 20-25% with global  hypokinesis. 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 His chest x-ray showed no evidence of pneumonia.  01/06/2016  Cody George was seen today in follow-up. He underwent cardioversion after loading with amiodarone which required 3 shocks but ultimately he converted back to sinus rhythm. Today he is in an atrial paced rhythm but appears to be maintaining sinus. Unfortunately he feels "horrible". It's not clear why he feels worse today but has felt better in the past. He says some days are good and some days are bad. He also became very tearful in the office today. He is still quite upset about the death of a family member last year. I'm concerned that he is struggling from depression. He does not appear volume overloaded on exam. Now that he is back in sinus rhythm, his cardiac output should be better but he still reports fatigue, poor sleep at night, lack of interest in things, tearfulness and other symptoms which may be more likely to be depression.  04/13/2016  Cody George returns today for follow-up. He is doing markedly better. His color is improved significantly and so is his attitude. He has generally maintained a sinus rhythm since cardioversion on 12/07/2015. He was on amiodarone but had significant side effects with this. The medication was then wean down and discontinued. His overall burden of atrial fibrillation was about 27%. In general it seems like his A. fib may be playing a role in his fatigue. He recently has been describing some left upper extremity swelling. He saw Dr. Loletha George, who felt that this could be occlusion of the left subclavian vein related to the pacemaker leads with possibly super impose thrombus. Today he reports is been no significant change in his swelling despite being on Eliquis for more than 3 months. He has been trying to keep his arm  elevated.  10/11/2016  Cody George returns today for follow-up. He recently saw Dr. Loletha George for interrogation of his pacemaker. He had an echo in February which showed improvement in EF from 20-25% up to 40%. He says he feels great and in fact if he didn't have ongoing back pain he be in very good shape. He's had resolution of his upper extremity swelling. He does get some right lower extremity swelling. He has hemosiderin deposition of the right lower extremity and some superficial varicosities suggesting poor venous return. He notes marked improvement when elevating his foot on aggressive suspect this is venous insufficiency. He may benefit from wearing compression stocking. He denies any further syncope. He has no chest pain or worsening shortness of breath.  12/22/2016  Cody George returns today for hospital follow-up.  He was recently hospitalized in any pain in [place] for A. fib with RVR and acute congestive heart failure.  He required IV diuretics and medication adjustment for rate control.  He lost about 10 pounds with diuresis and says his breathing is better however he feels very fatigued and has almost no energy.  On discharge, surprisingly he was told to increase his Toprol-XL from  50 mg daily to 300 mg or 3 tablets twice daily.  Passive dose change and I suspect may be a medication error.  He said he took this for a couple days and felt terrible and then went back to taking Toprol-XL 50 mg daily.  On the day prior to discharge his schedule his meds indicated he was taking metoprolol succinate 150 mg twice daily, so it seems that he was on this increased dose.  Dr. Domenic Polite had recommended increasing the dose to 100 in the morning and 50 at night.  He was also started on digoxin.  Heart rate and blood pressure appear normal today.  02/13/2017  Cody George returns today for follow-up.  He continues to feel fatigued.  He says his energy level is not good.  He is in more persistent atrial  fibrillation and recently his pacemaker was changed to VVIR.  I did lab work which indicates no evidence of anemia however BNP is elevated.  Creatinine is stable.  His LVEF has declined to 30%.  He recently had an elevated digoxin level and I decreased his digoxin intake.  His repeat level was 0.7.  03/17/2017  Cody George returns today for follow-up.  He actually looks great today.  He is much more alert and awake.  He is more in a joking mood.  He has had about 11 pound weight gain since I saw him however he had lost a lot of weight and still is within normal limits.  He says he feels much better and denies any shortness of breath.  There is no evidence of any edema.  I suspect that his appetite is improved and he reported that is the case.  He has been taking the Entresto now for over a month and feels like it is helping him.  We discussed the importance of trying to increase the dose as tolerated.  He has at least another month of samples which time we will hopefully step him up to the 49/51 mg dose.  He had a recent remote patient check which was normal.  06/14/2017  Cody George is seen today in follow-up.  He seems to be in a really good mood today and is accompanied by another 1 of his daughters.  He denies any chest pain or shortness of breath however his daughter noted that he has been complaining of shortness of breath at home.  Interestingly she says that he is not been compliant with his inhaler for COPD.  That certainly could explain things.  He seems to be taking the Entresto, though blood pressure is well controlled today.  Weight has been stable and is within a pound of his weight 3 months ago.  He denies any bleeding problems on Eliquis.  Recent remote check of his device shows normal function.  He is in permanent atrial fibrillation.  12/27/2017  Cody George returns today for follow-up.  In general he is feeling much better.  In fact to me looks remarkably better compared to when I saw him  about a year and 1/2 to 2 years ago.  At that time he was very depressed he had low energy and was significantly fatigued.  EF had been as low as 30% however came up to 40% last year.  His most recent echo performed about 1 month ago showed his EF up to 50 to 55%.  He does have moderate aortic insufficiency and mild aortic stenosis which will need to follow.  Overall his energy level is  good and he endorses and most NYHA class II symptoms.  He is on moderate dose Entresto and blood pressure will not likely allow increased to the high dose.  He also remains on digoxin and Toprol-XL 25 mg daily (this dose was recently decreased by Dr. Loletha George to allow less frequent pacing).  His most recent pacemaker interrogation indicated 80% V pacing.  PMHx:  Past Medical History:  Diagnosis Date  . Acute systolic congestive heart failure ([place]) 12/07/2015  . Arthritis   . Cellulitis   . Community acquired pneumonia 04/14/2014  . COPD mixed type ([place]) 12/07/2016  . Deep vein thrombosis (DVT) of left upper extremity ([place]) 04/13/2016  . Depression 01/06/2016  . Dysrhythmia    PAF  . GERD (gastroesophageal reflux disease)   . History of nuclear stress test 10/05/2010   dipyridamole; normal pattern of perfusion in all regions; no significant ishcemia, low risk scan   . Hypertension   . OA (osteoarthritis)   . Pacemaker 07/22/2011   St. 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 Paroxysmal atrial fibrillation ([facility])   . Prostate cancer ([place])    radiation  . Shortness of breath     Past Surgical History:  Procedure Laterality Date  . CARDIOVERSION N/A 12/07/2015   Procedure: CARDIOVERSION;  Surgeon: Pixie Casino, MD;  Location: [facility] ENDOSCOPY;  Service: Cardiovascular;  Laterality: N/A;  . HERNIA REPAIR Bilateral   . INGUINAL HERNIA REPAIR Left 06/10/2015   Procedure: HERNIA REPAIR INGUINAL ADULT WITH MESH;  Surgeon: Aviva Signs, MD;  Location: [facility] ORS;  Service: General;   Laterality: Left;  Pt notified to arrive at 6:15am K[facility]  . NM MYOCAR PERF WALL MOTION  10/05/10   normal  . PACEMAKER INSERTION  07/22/2011   St. 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 PERMANENT PACEMAKER INSERTION N/A 07/22/2011   Procedure: PERMANENT PACEMAKER INSERTION;  Surgeon: Sanda Klein, MD;  Location: [facility] CATH LAB;  Service: Cardiovascular;  Laterality: N/A;  . PILONIDAL CYST DRAINAGE     x2  . TEE WITHOUT CARDIOVERSION N/A 12/07/2015   Procedure: TRANSESOPHAGEAL ECHOCARDIOGRAM (TEE);  Surgeon: Pixie Casino, MD;  Location[facility] ENDOSCOPY;  Service: Cardiovascular;  Laterality: N/A;  . TRANSTHORACIC ECHOCARDIOGRAM  10/05/2010   EF=50-55%, normal LV systolic function; LA & RA mildly dilated; mild MR; mild TR with RV systolic pressure elevted at 30-87mmHg; AV mildly sclerotic with mild calcif of AV leaflets, mild valvular AS, mild-mod regurg; trace pulm valve regurg  . US ECHOCARDIOGRAPHY  10/05/10   LA mildly dilated, mild TR, mild ca+ of AOV ,mild to mod. AI    FAMHx:  Family History  Problem Relation Age of Onset  . Sudden Cardiac Death Neg Hx     SOCHx:   reports that he has been smoking. He has a 144.00 pack-year smoking history. He has never used smokeless tobacco. He reports that he does not drink alcohol or use drugs.  ALLERGIES:  No Known Allergies  ROS: Pertinent items noted in HPI and remainder of comprehensive ROS otherwise negative.  HOME MEDS: Current Outpatient Medications  Medication Sig Dispense Refill  . digoxin (LANOXIN) 0.125 MG tablet TAKE 1 TABLET (0.125 MG TOTAL) BY MOUTH DAILY 90 tablet 1  . ELIQUIS 5 MG TABS tablet TAKE ONE TABLET BY MOUTH TWICE A DAY 180 tablet 1  . Fluticasone-Umeclidin-Vilant (TRELEGY ELLIPTA) 100-62.5-25 MCG/INH AEPB Inhale 1 puff into the lungs daily. 30 each 0  .  furosemide (LASIX) 40 MG tablet Take 1 tablet (40 mg total) by mouth daily. 90 tablet 2  . guaiFENesin (MUCINEX) 600 MG 12 hr  tablet Take 1 tablet (600 mg total) by mouth 2 (two) times daily. 60 tablet 0  . metoprolol succinate (TOPROL-XL) 25 MG 24 hr tablet Take 1 tablet (25 mg total) by mouth daily. Take with or immediately following a meal. 90 tablet 3  . mirtazapine (REMERON) 30 MG tablet Take 30 mg by mouth at bedtime.     . sacubitril-valsartan (ENTRESTO) 49-51 MG Take 1 tablet by mouth 2 (two) times daily. 60 tablet 5  . tamsulosin (FLOMAX) 0.4 MG CAPS capsule Take 0.4 mg by mouth every evening.     . vitamin B-12 (CYANOCOBALAMIN) 1000 MCG tablet Take 1,000 mcg by mouth daily.    Marland Kitchen VITAMIN B1-B12 IJ Inject 1 Dose as directed every 30 (thirty) days.    Marland Kitchen VITAMIN C, CALCIUM ASCORBATE, PO Take 1,000 mg by mouth daily.     Marland Kitchen VITAMIN D, CHOLECALCIFEROL, PO Take 1,000 mg by mouth daily.      No current facility-administered medications for this visit.     LABS/IMAGING: No results found for this or any previous visit (from the past 48 hour(s)). No results found.  VITALS: BP 124/64   Pulse 76   Ht 5\' 9"  (1.753 m)   Wt 159 lb (72.1 kg)   BMI 23.48 kg/m   EXAM: General appearance: alert and no distress Neck: no carotid bruit, no JVD and thyroid not enlarged, symmetric, no tenderness/mass/nodules Lungs: clear to auscultation bilaterally Heart: irregularly irregular rhythm, S1, S2 normal, systolic murmur: systolic ejection 2/6, crescendo at 2nd right intercostal space and possibly a diastolic mumur Abdomen: soft, non-tender; bowel sounds normal; no masses,  no organomegaly Extremities: edema 1+ left ankle edema and venous stasis dermatitis noted Pulses: 2+ and symmetric Skin: Skin color, texture, turgor normal. No rashes or lesions Neurologic: Grossly normal Psych: Pleasant  EKG: A. fib with ventricular pacing at 78-personally reviewed  ASSESSMENT: 1. Chronic systolic congestive heart failure, LVEF 20-25% (improved to 50-55% - 11/2017), NYHA class II heart failure symptoms 2. Status post St. Jude Accent  DR pacemaker for sinus arrest - VVIR 3. Labile hypertension - controlled 4. Chronic anticoagulation on Eliquis 5. COPD -noncompliant with inhalers 6. Aortic stenosis - mild to moderate (03/2016), with moderate AI  PLAN: 1.   Cody George has had significant improvement in his exercise capacity, mood and energy level.  His LVEF is now improved up to 50 to 55% as of October 2019.  He is on moderate dose Entresto and digoxin.  He is due for repeat digoxin level which have been high in the past but was 0.7 a year ago.  We will also recheck renal function.  He is pacing only about 80% of the time which is decreased after recent decrease in his beta-blocker.  Blood pressure is normal.  He denies bleeding problems on Eliquis.  We will provide paperwork to get preauthorization of this as well as his Entresto.  He is noted to have mild to moderate aortic stenosis with moderate aortic insufficiency which we will continue to follow.  Plan follow-up in 6 months or sooner as necessary.  Pixie Casino, MD, [facility], [place] Director of the Advanced Lipid Disorders &  Cardiovascular Risk Reduction Clinic Attending Cardiologist  Direct Dial: [phone]  Fax: [phone]  Website:  [email or web address]  Pixie Casino 12/27/2017,  8:49 AM

## 2018-01-09 ENCOUNTER — Telehealth: Payer: Self-pay | Admitting: Internal Medicine

## 2018-01-09 NOTE — Telephone Encounter (Signed)
New message ° ° ° °Pt daughter is calling with questions about medication assistance. Please call.  °

## 2018-01-09 NOTE — Telephone Encounter (Signed)
Pt daughter called back to ask if she can bring in her portion of her the pts med assistance paperwork when she brings in her mothers next week to be faxed in. I told her that would be fine and to just be sure that the pt portion is fully completed.

## 2018-01-17 DIAGNOSIS — J441 Chronic obstructive pulmonary disease with (acute) exacerbation: Secondary | ICD-10-CM | POA: Diagnosis not present

## 2018-01-17 DIAGNOSIS — I509 Heart failure, unspecified: Secondary | ICD-10-CM | POA: Diagnosis not present

## 2018-01-17 DIAGNOSIS — I482 Chronic atrial fibrillation, unspecified: Secondary | ICD-10-CM | POA: Diagnosis not present

## 2018-01-17 DIAGNOSIS — D519 Vitamin B12 deficiency anemia, unspecified: Secondary | ICD-10-CM | POA: Diagnosis not present

## 2018-01-17 DIAGNOSIS — J439 Emphysema, unspecified: Secondary | ICD-10-CM | POA: Diagnosis not present

## 2018-01-18 ENCOUNTER — Telehealth: Payer: Self-pay | Admitting: Internal Medicine

## 2018-01-18 NOTE — Telephone Encounter (Signed)
Spoke with pt's daughter who was inquiring about the Entresto and Eliquis pt assistance forms she dropped off last week. Informed daughter that Dr. Lysbeth Penner nurse is out of the office today but will route a message for her to contact her.

## 2018-01-18 NOTE — Telephone Encounter (Signed)
New Message:   Patient daughter calling concerning some paper work she left to be fax for some medication. Please call daughter,

## 2018-01-19 NOTE — Telephone Encounter (Signed)
Notified daughter that paperwork was received and MD has been out of office this week. These will be signed upon his return and sent in.

## 2018-01-22 DIAGNOSIS — E43 Unspecified severe protein-calorie malnutrition: Secondary | ICD-10-CM | POA: Diagnosis not present

## 2018-01-22 DIAGNOSIS — Z950 Presence of cardiac pacemaker: Secondary | ICD-10-CM | POA: Diagnosis not present

## 2018-01-22 DIAGNOSIS — Z23 Encounter for immunization: Secondary | ICD-10-CM | POA: Diagnosis not present

## 2018-01-22 DIAGNOSIS — I480 Paroxysmal atrial fibrillation: Secondary | ICD-10-CM | POA: Diagnosis not present

## 2018-01-22 DIAGNOSIS — I5021 Acute systolic (congestive) heart failure: Secondary | ICD-10-CM | POA: Diagnosis not present

## 2018-01-24 NOTE — Telephone Encounter (Signed)
Faxed patient assistance for Delene Loll to [employer] @ [phone]  Faxed patient assistance for Eliquis to [employer] @ [phone]

## 2018-02-13 LAB — CUP PACEART REMOTE DEVICE CHECK
Battery Voltage: 2.9 V
Brady Statistic RV Percent Paced: 74 %
Date Time Interrogation Session: 20191029152953
Implantable Lead Implant Date: 20130607
Implantable Lead Location: 753860
Implantable Pulse Generator Implant Date: 20130607
Lead Channel Pacing Threshold Amplitude: 1 V
Lead Channel Pacing Threshold Pulse Width: 0.5 ms
Lead Channel Sensing Intrinsic Amplitude: 12 mV
Lead Channel Setting Pacing Amplitude: 2.5 V
MDC IDC LEAD IMPLANT DT: 20130607
MDC IDC LEAD LOCATION: 753859
MDC IDC MSMT BATTERY REMAINING LONGEVITY: 72 mo
MDC IDC MSMT BATTERY REMAINING PERCENTAGE: 65 %
MDC IDC MSMT LEADCHNL RV IMPEDANCE VALUE: 410 Ohm
MDC IDC SET LEADCHNL RV PACING PULSEWIDTH: 0.5 ms
MDC IDC SET LEADCHNL RV SENSING SENSITIVITY: 2 mV
Pulse Gen Serial Number: 7353349

## 2018-02-27 DIAGNOSIS — J441 Chronic obstructive pulmonary disease with (acute) exacerbation: Secondary | ICD-10-CM | POA: Diagnosis not present

## 2018-02-27 DIAGNOSIS — I509 Heart failure, unspecified: Secondary | ICD-10-CM | POA: Diagnosis not present

## 2018-02-27 DIAGNOSIS — J449 Chronic obstructive pulmonary disease, unspecified: Secondary | ICD-10-CM | POA: Diagnosis not present

## 2018-02-27 DIAGNOSIS — D519 Vitamin B12 deficiency anemia, unspecified: Secondary | ICD-10-CM | POA: Diagnosis not present

## 2018-02-27 DIAGNOSIS — Z0000 Encounter for general adult medical examination without abnormal findings: Secondary | ICD-10-CM | POA: Diagnosis not present

## 2018-03-02 DIAGNOSIS — L308 Other specified dermatitis: Secondary | ICD-10-CM | POA: Diagnosis not present

## 2018-03-13 ENCOUNTER — Ambulatory Visit (INDEPENDENT_AMBULATORY_CARE_PROVIDER_SITE_OTHER): Payer: Medicare Other

## 2018-03-13 DIAGNOSIS — I495 Sick sinus syndrome: Secondary | ICD-10-CM | POA: Diagnosis not present

## 2018-03-13 DIAGNOSIS — I428 Other cardiomyopathies: Secondary | ICD-10-CM

## 2018-03-14 NOTE — Progress Notes (Signed)
Remote pacemaker transmission.   

## 2018-03-15 LAB — CUP PACEART REMOTE DEVICE CHECK
Battery Remaining Longevity: 72 mo
Battery Remaining Percentage: 65 %
Battery Voltage: 2.9 V
Brady Statistic RV Percent Paced: 74 %
Date Time Interrogation Session: 20200128075354
Implantable Lead Implant Date: 20130607
Implantable Lead Implant Date: 20130607
Implantable Lead Location: 753859
Implantable Lead Location: 753860
Implantable Pulse Generator Implant Date: 20130607
Lead Channel Impedance Value: 430 Ohm
Lead Channel Pacing Threshold Amplitude: 1 V
Lead Channel Pacing Threshold Pulse Width: 0.5 ms
Lead Channel Setting Pacing Amplitude: 2.5 V
Lead Channel Setting Sensing Sensitivity: 2 mV
MDC IDC MSMT LEADCHNL RV SENSING INTR AMPL: 12 mV
MDC IDC SET LEADCHNL RV PACING PULSEWIDTH: 0.5 ms
Pulse Gen Model: 2210
Pulse Gen Serial Number: 7353349

## 2018-03-20 LAB — CUP PACEART INCLINIC DEVICE CHECK
Date Time Interrogation Session: 20200204135508
Implantable Lead Implant Date: 20130607
Implantable Lead Implant Date: 20130607
Implantable Lead Location: 753860
Implantable Pulse Generator Implant Date: 20130607
MDC IDC LEAD LOCATION: 753859
Pulse Gen Serial Number: 7353349

## 2018-05-08 NOTE — Telephone Encounter (Signed)
Spoke with pt wife and informed that patient assistance for Eliquis was denied due to medication covered by insurance.

## 2018-05-08 NOTE — Telephone Encounter (Signed)
Patient's daughter Cecille Rubin called wanting to know what [employer] wasn't going to cover Eliquis for her Dad, when they covered the Eliquis for her mom.

## 2018-05-10 NOTE — Telephone Encounter (Signed)
Spoke with pt's daughter who states she has already spoken to [employer] and submitting documentation for an appeal.

## 2018-05-15 NOTE — Telephone Encounter (Signed)
Received letter from [employer] stating pt was approved for Eliquis from 05/09/2018-02/14/2019. Daughter updated.

## 2018-06-12 ENCOUNTER — Ambulatory Visit (INDEPENDENT_AMBULATORY_CARE_PROVIDER_SITE_OTHER): Payer: Medicare Other | Admitting: *Deleted

## 2018-06-12 ENCOUNTER — Other Ambulatory Visit: Payer: Self-pay

## 2018-06-12 DIAGNOSIS — I495 Sick sinus syndrome: Secondary | ICD-10-CM | POA: Diagnosis not present

## 2018-06-12 DIAGNOSIS — I428 Other cardiomyopathies: Secondary | ICD-10-CM

## 2018-06-12 LAB — CUP PACEART REMOTE DEVICE CHECK
Battery Remaining Longevity: 64 mo
Battery Remaining Percentage: 57 %
Battery Voltage: 2.89 V
Brady Statistic RV Percent Paced: 75 %
Date Time Interrogation Session: 20200428062658
Implantable Lead Implant Date: 20130607
Implantable Lead Implant Date: 20130607
Implantable Lead Location: 753859
Implantable Lead Location: 753860
Implantable Pulse Generator Implant Date: 20130607
Lead Channel Impedance Value: 410 Ohm
Lead Channel Pacing Threshold Amplitude: 1 V
Lead Channel Pacing Threshold Pulse Width: 0.5 ms
Lead Channel Sensing Intrinsic Amplitude: 12 mV
Lead Channel Setting Pacing Amplitude: 2.5 V
Lead Channel Setting Pacing Pulse Width: 0.5 ms
Lead Channel Setting Sensing Sensitivity: 2 mV
Pulse Gen Model: 2210
Pulse Gen Serial Number: 7353349

## 2018-06-19 ENCOUNTER — Other Ambulatory Visit: Payer: Self-pay | Admitting: Internal Medicine

## 2018-06-19 DIAGNOSIS — Z0000 Encounter for general adult medical examination without abnormal findings: Secondary | ICD-10-CM | POA: Diagnosis not present

## 2018-06-19 NOTE — Telephone Encounter (Signed)
Digoxin 0.125 mg refilled. 

## 2018-06-20 NOTE — Progress Notes (Signed)
Remote pacemaker transmission.   

## 2018-06-26 ENCOUNTER — Ambulatory Visit: Payer: Medicare Other | Admitting: Internal Medicine

## 2018-08-02 DIAGNOSIS — Z0000 Encounter for general adult medical examination without abnormal findings: Secondary | ICD-10-CM | POA: Diagnosis not present

## 2018-08-02 DIAGNOSIS — J449 Chronic obstructive pulmonary disease, unspecified: Secondary | ICD-10-CM | POA: Diagnosis not present

## 2018-08-02 DIAGNOSIS — D519 Vitamin B12 deficiency anemia, unspecified: Secondary | ICD-10-CM | POA: Diagnosis not present

## 2018-08-02 DIAGNOSIS — G4700 Insomnia, unspecified: Secondary | ICD-10-CM | POA: Diagnosis not present

## 2018-08-02 DIAGNOSIS — I509 Heart failure, unspecified: Secondary | ICD-10-CM | POA: Diagnosis not present

## 2018-08-06 DIAGNOSIS — Z0001 Encounter for general adult medical examination with abnormal findings: Secondary | ICD-10-CM | POA: Diagnosis not present

## 2018-08-06 DIAGNOSIS — I5021 Acute systolic (congestive) heart failure: Secondary | ICD-10-CM | POA: Diagnosis not present

## 2018-08-06 DIAGNOSIS — Z950 Presence of cardiac pacemaker: Secondary | ICD-10-CM | POA: Diagnosis not present

## 2018-08-06 DIAGNOSIS — I480 Paroxysmal atrial fibrillation: Secondary | ICD-10-CM | POA: Diagnosis not present

## 2018-08-31 DIAGNOSIS — J449 Chronic obstructive pulmonary disease, unspecified: Secondary | ICD-10-CM | POA: Diagnosis not present

## 2018-08-31 DIAGNOSIS — J441 Chronic obstructive pulmonary disease with (acute) exacerbation: Secondary | ICD-10-CM | POA: Diagnosis not present

## 2018-08-31 DIAGNOSIS — Z0000 Encounter for general adult medical examination without abnormal findings: Secondary | ICD-10-CM | POA: Diagnosis not present

## 2018-08-31 DIAGNOSIS — D519 Vitamin B12 deficiency anemia, unspecified: Secondary | ICD-10-CM | POA: Diagnosis not present

## 2018-08-31 DIAGNOSIS — I509 Heart failure, unspecified: Secondary | ICD-10-CM | POA: Diagnosis not present

## 2018-09-11 ENCOUNTER — Ambulatory Visit (INDEPENDENT_AMBULATORY_CARE_PROVIDER_SITE_OTHER): Payer: Medicare Other | Admitting: *Deleted

## 2018-09-11 DIAGNOSIS — I495 Sick sinus syndrome: Secondary | ICD-10-CM | POA: Diagnosis not present

## 2018-09-11 LAB — CUP PACEART REMOTE DEVICE CHECK
Battery Remaining Longevity: 55 mo
Battery Remaining Percentage: 51 %
Battery Voltage: 2.87 V
Brady Statistic RV Percent Paced: 78 %
Date Time Interrogation Session: 20200728090126
Implantable Lead Implant Date: 20130607
Implantable Lead Implant Date: 20130607
Implantable Lead Location: 753859
Implantable Lead Location: 753860
Implantable Pulse Generator Implant Date: 20130607
Lead Channel Impedance Value: 390 Ohm
Lead Channel Pacing Threshold Amplitude: 1 V
Lead Channel Pacing Threshold Pulse Width: 0.5 ms
Lead Channel Sensing Intrinsic Amplitude: 12 mV
Lead Channel Setting Pacing Amplitude: 2.5 V
Lead Channel Setting Pacing Pulse Width: 0.5 ms
Lead Channel Setting Sensing Sensitivity: 2 mV
Pulse Gen Model: 2210
Pulse Gen Serial Number: 7353349

## 2018-09-17 DIAGNOSIS — D519 Vitamin B12 deficiency anemia, unspecified: Secondary | ICD-10-CM | POA: Diagnosis not present

## 2018-09-17 DIAGNOSIS — I509 Heart failure, unspecified: Secondary | ICD-10-CM | POA: Diagnosis not present

## 2018-09-17 DIAGNOSIS — Z0000 Encounter for general adult medical examination without abnormal findings: Secondary | ICD-10-CM | POA: Diagnosis not present

## 2018-09-17 DIAGNOSIS — J441 Chronic obstructive pulmonary disease with (acute) exacerbation: Secondary | ICD-10-CM | POA: Diagnosis not present

## 2018-09-17 DIAGNOSIS — J449 Chronic obstructive pulmonary disease, unspecified: Secondary | ICD-10-CM | POA: Diagnosis not present

## 2018-09-24 NOTE — Progress Notes (Signed)
Remote pacemaker transmission.   

## 2018-10-03 ENCOUNTER — Ambulatory Visit (INDEPENDENT_AMBULATORY_CARE_PROVIDER_SITE_OTHER): Payer: Medicare Other | Admitting: Internal Medicine

## 2018-10-03 ENCOUNTER — Other Ambulatory Visit: Payer: Self-pay

## 2018-10-03 ENCOUNTER — Encounter: Payer: Self-pay | Admitting: Internal Medicine

## 2018-10-03 VITALS — BP 130/70 | HR 76 | Temp 97.5°F | Ht 66.0 in | Wt 166.8 lb

## 2018-10-03 DIAGNOSIS — I428 Other cardiomyopathies: Secondary | ICD-10-CM

## 2018-10-03 DIAGNOSIS — I4821 Permanent atrial fibrillation: Secondary | ICD-10-CM

## 2018-10-03 DIAGNOSIS — I5023 Acute on chronic systolic (congestive) heart failure: Secondary | ICD-10-CM | POA: Diagnosis not present

## 2018-10-03 DIAGNOSIS — Z950 Presence of cardiac pacemaker: Secondary | ICD-10-CM | POA: Diagnosis not present

## 2018-10-03 NOTE — Patient Instructions (Signed)
Medication Instructions:  Take LASIX as directed Continue other current medications If you need a refill on your cardiac medications before your next appointment, please call your pharmacy.    Follow-Up: At [facility], you and your health needs are our priority.  As part of our continuing mission to provide you with exceptional heart care, we have created designated Provider Care Teams.  These Care Teams include your primary Cardiologist (physician) and Advanced Practice Providers (APPs -  Physician Assistants and Nurse Practitioners) who all work together to provide you with the care you need, when you need it. You will need a follow up appointment in 6 months.  Please call our office 2 months in advance to schedule this appointment.  You may see Pixie Casino, MD or one of the following Advanced Practice Providers on your designated Care Team: [place], [place] . Fabian Sharp, PA-C  Any Other Special Instructions Will Be Listed Below (If Applicable).

## 2018-10-03 NOTE — Progress Notes (Signed)
OFFICE NOTE  Chief Complaint:  Gaining weight  Primary Care Physician: Celene Squibb, MD  HPI:  CHARELS George is an 85 year old gentleman who has a history of atrial fibrillation and tachy-brady syndrome as well as paroxysmal atrial fibrillation and sinus node arrest. He underwent a St. Jude Accent DR dual-chamber pacemaker in June of 2013. He has done fairly well other than he had some hypotension on lisinopril which was discontinued. He has recently had some tachyarrhythmias and had increased his metoprolol succinate to 25 mg daily due to tachyarrhythmias. He is enrolled in a home monitoring service with the [facility] patient care network. He was successfully transitioned over to Eliquis she is taking without any bleeding difficulty. He denies any shortness of breath or worsening chest pain. He is due for a remote pacer check in a few days.  Mr. Cody George returns for follow-up today. He is feeling well denies any chest pain or worsening shortness of breath. He has no palpitations. Blood pressure is well controlled. He had recent bilateral cataract surgery and is seeing much better. There are no bleeding issues.  I saw Mr. Cody George back in the office today for follow-up. Tells me a few months ago he had pneumonia and has been very slow to recover. Chest x-ray shows improvement however there are changes consistent with COPD. He has a greater than 65 year history of smoking. I do not believe he carries a diagnosis of COPD and is currently not on any medications for this. He reports some worsening shortness of breath as well has fatigue. He denies any particular chest pain. He does have valvular heart disease and history of aortic stenosis. Recent echo in March shows some worsening of valvular function with now moderate aortic stenosis.  Mr. Cody George returns today for follow-up. Overall he is without complaints. He scheduled to see Dr. Loletha Grayer in the office next week for follow-up of his pacemaker. He is  atrially paced today. He had a low burden of atrial fibrillation but remains on Eliquis. He is asymptomatic with this. He's had no bleeding problems on Eliquis. Blood pressure is high normal today 152/82. He says that generally is between 389 and 373 systolic at home. I reminded him that his prior echocardiogram showed what may be moderate aortic stenosis. Interestingly, I have not been able to auscultate that on exam. His prior echo was almost a year ago and will be due for repeat echo this year.  11/04/2015  Mr. Cody George was seen today in the office. He reports that over the past several months she's had a significant decline. He has had worsening energy and fatigue as well as shortness of breath. He can only walk short distances before becoming significantly short of breath. He is also developed a mildly productive cough with a tannish sputum. He is concerned about whether he might have had recurrent pneumonia. He does have a history of COPD and pneumonia in the past. An echocardiogram was performed and March 2017 which showed mild to moderate aortic stenosis by valve gradient however visually the aortic valve appeared to be moderate to severely stenosed. On exam is very difficult with his COPD and hyperexpansion to auscultate his aortic murmur and I cannot reliably hear the second heart sound. Mr. Cody George appeared to be short of breath today and was unable to finish sentences at times. He denies any fevers or chills at home. He has had some mild recent weight loss. He also complains of lower extremity swelling, which  is worse on the left leg than the right. There is some orthopnea.  11/16/2015  Mr. Cody George returns today for follow-up. He reports feeling much better. His weight has come down several pounds and he notes marked improvement in his edema. His shortness of breath is also improved and he feels much more energized. Unfortunately his echocardiogram shows a newly reduced LVEF of 20-25% with global  hypokinesis. 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 His chest x-ray showed no evidence of pneumonia.  01/06/2016  Mr. Cody George was seen today in follow-up. He underwent cardioversion after loading with amiodarone which required 3 shocks but ultimately he converted back to sinus rhythm. Today he is in an atrial paced rhythm but appears to be maintaining sinus. Unfortunately he feels "horrible". It's not clear why he feels worse today but has felt better in the past. He says some days are good and some days are bad. He also became very tearful in the office today. He is still quite upset about the death of a family member last year. I'm concerned that he is struggling from depression. He does not appear volume overloaded on exam. Now that he is back in sinus rhythm, his cardiac output should be better but he still reports fatigue, poor sleep at night, lack of interest in things, tearfulness and other symptoms which may be more likely to be depression.  04/13/2016  Mr. Cody George returns today for follow-up. He is doing markedly better. His color is improved significantly and so is his attitude. He has generally maintained a sinus rhythm since cardioversion on 12/07/2015. He was on amiodarone but had significant side effects with this. The medication was then wean down and discontinued. His overall burden of atrial fibrillation was about 27%. In general it seems like his A. fib may be playing a role in his fatigue. He recently has been describing some left upper extremity swelling. He saw Dr. Loletha Grayer, who felt that this could be occlusion of the left subclavian vein related to the pacemaker leads with possibly super impose thrombus. Today he reports is been no significant change in his swelling despite being on Eliquis for more than 3 months. He has been trying to keep his arm  elevated.  10/11/2016  Mr. Cody George returns today for follow-up. He recently saw Dr. Loletha Grayer for interrogation of his pacemaker. He had an echo in February which showed improvement in EF from 20-25% up to 40%. He says he feels great and in fact if he didn't have ongoing back pain he be in very good shape. He's had resolution of his upper extremity swelling. He does get some right lower extremity swelling. He has hemosiderin deposition of the right lower extremity and some superficial varicosities suggesting poor venous return. He notes marked improvement when elevating his foot on aggressive suspect this is venous insufficiency. He may benefit from wearing compression stocking. He denies any further syncope. He has no chest pain or worsening shortness of breath.  12/22/2016  Mr. Schara returns today for hospital follow-up.  He was recently hospitalized in any pain in [place] for A. fib with RVR and acute congestive heart failure.  He required IV diuretics and medication adjustment for rate control.  He lost about 10 pounds with diuresis and says his breathing is better however he feels very fatigued and has almost no energy.  On discharge, surprisingly he was told to increase his Toprol-XL from 71  mg daily to 300 mg or 3 tablets twice daily.  Passive dose change and I suspect may be a medication error.  He said he took this for a couple days and felt terrible and then went back to taking Toprol-XL 50 mg daily.  On the day prior to discharge his schedule his meds indicated he was taking metoprolol succinate 150 mg twice daily, so it seems that he was on this increased dose.  Dr. Domenic Polite had recommended increasing the dose to 100 in the morning and 50 at night.  He was also started on digoxin.  Heart rate and blood pressure appear normal today.  02/13/2017  Mr. Waide returns today for follow-up.  He continues to feel fatigued.  He says his energy level is not good.  He is in more persistent atrial  fibrillation and recently his pacemaker was changed to VVIR.  I did lab work which indicates no evidence of anemia however BNP is elevated.  Creatinine is stable.  His LVEF has declined to 30%.  He recently had an elevated digoxin level and I decreased his digoxin intake.  His repeat level was 0.7.  03/17/2017  Mr. Mccollum returns today for follow-up.  He actually looks great today.  He is much more alert and awake.  He is more in a joking mood.  He has had about 11 pound weight gain since I saw him however he had lost a lot of weight and still is within normal limits.  He says he feels much better and denies any shortness of breath.  There is no evidence of any edema.  I suspect that his appetite is improved and he reported that is the case.  He has been taking the Entresto now for over a month and feels like it is helping him.  We discussed the importance of trying to increase the dose as tolerated.  He has at least another month of samples which time we will hopefully step him up to the 49/51 mg dose.  He had a recent remote patient check which was normal.  06/14/2017  Mr. Skibinski is seen today in follow-up.  He seems to be in a really good mood today and is accompanied by another 1 of his daughters.  He denies any chest pain or shortness of breath however his daughter noted that he has been complaining of shortness of breath at home.  Interestingly she says that he is not been compliant with his inhaler for COPD.  That certainly could explain things.  He seems to be taking the Entresto, though blood pressure is well controlled today.  Weight has been stable and is within a pound of his weight 3 months ago.  He denies any bleeding problems on Eliquis.  Recent remote check of his device shows normal function.  He is in permanent atrial fibrillation.  12/27/2017  Mr. Shurley returns today for follow-up.  In general he is feeling much better.  In fact to me looks remarkably better compared to when I saw him  about a year and 1/2 to 2 years ago.  At that time he was very depressed he had low energy and was significantly fatigued.  EF had been as low as 30% however came up to 40% last year.  His most recent echo performed about 1 month ago showed his EF up to 50 to 55%.  He does have moderate aortic insufficiency and mild aortic stenosis which will need to follow.  Overall his energy level is good  and he endorses and most NYHA class II symptoms.  He is on moderate dose Entresto and blood pressure will not likely allow increased to the high dose.  He also remains on digoxin and Toprol-XL 25 mg daily (this dose was recently decreased by Dr. Loletha Grayer to allow less frequent pacing).  His most recent pacemaker interrogation indicated 80% V pacing.  10/03/2018  Mr. Kober is seen today in follow-up.  Overall he says he feels well.  Unfortunately lost his wife in June.  I been married for over 37 years.  Recently his echo in 2019 showed improvement in LVEF up to 50 to 55%.  He still has generally NYHA class II heart failure symptoms.  Recently he has had some weight gain.  In fact he is up about 6 pounds.  On exam today he is noted to be edematous.  He says he is not been taking his Lasix routinely.  He had a remote check recently which showed permanent atrial fibrillation.  He is scheduled for an in office visit with Dr. Loletha Grayer on Monday.  PMHx:  Past Medical History:  Diagnosis Date   Acute systolic congestive heart failure ([place]) 12/07/2015   Arthritis    Cellulitis    Community acquired pneumonia 04/14/2014   COPD mixed type ([place]) 12/07/2016   Deep vein thrombosis (DVT) of left upper extremity ([place]) 04/13/2016   Depression 01/06/2016   Dysrhythmia    PAF   GERD (gastroesophageal reflux disease)    History of nuclear stress test 10/05/2010   dipyridamole; normal pattern of perfusion in all regions; no significant ishcemia, low risk scan    Hypertension    OA (osteoarthritis)    Pacemaker 07/22/2011   St.  Jude Accent DR dual-chamber; r/t tachy-brady syndrome (PAF & sinus node arrest)   Paroxysmal atrial fibrillation ([facility])    Prostate cancer ([place])    radiation   Shortness of breath     Past Surgical History:  Procedure Laterality Date   CARDIOVERSION N/A 12/07/2015   Procedure: CARDIOVERSION;  Surgeon: Pixie Casino, MD;  Location: [place];  Service: Cardiovascular;  Laterality: N/A;   HERNIA REPAIR Bilateral    INGUINAL HERNIA REPAIR Left 06/10/2015   Procedure: HERNIA REPAIR INGUINAL ADULT WITH MESH;  Surgeon: Aviva Signs, MD;  Location: [facility];  Service: General;  Laterality: Left;  Pt notified to arrive at 6:15am [place]  10/05/10   normal   PACEMAKER INSERTION  07/22/2011   St. Jude Accent DR dual-chamber; r/t tachy-brady syndrome (PAF & sinus node arrest)   PERMANENT PACEMAKER INSERTION N/A 07/22/2011   Procedure: PERMANENT PACEMAKER INSERTION;  Surgeon: Sanda Klein, MD;  Location: [place] CATH [facility];  Service: Cardiovascular;  Laterality: N/A;   PILONIDAL CYST DRAINAGE     x2   TEE WITHOUT CARDIOVERSION N/A 12/07/2015   Procedure: TRANSESOPHAGEAL ECHOCARDIOGRAM (TEE);  Surgeon: Pixie Casino, MD;  Location[facility];  Service: Cardiovascular;  Laterality: N/A;   TRANSTHORACIC ECHOCARDIOGRAM  10/05/2010   EF=50-55%, normal LV systolic function; LA & RA mildly dilated; mild MR; mild TR with RV systolic pressure elevted at 30-55mmHg; AV mildly sclerotic with mild calcif of AV leaflets, mild valvular AS, mild-mod regurg; trace pulm valve regurg   US ECHOCARDIOGRAPHY  10/05/10   LA mildly dilated, mild TR, mild ca+ of AOV ,mild to mod. AI    FAMHx:  Family History  Problem Relation Age of Onset   Sudden Cardiac Death Neg Hx  SOCHx:   reports that he has been smoking. He has a 144.00 pack-year smoking history. He has never used smokeless tobacco. He reports that he does not drink alcohol or use drugs.  ALLERGIES:  No Known  Allergies  ROS: Pertinent items noted in HPI and remainder of comprehensive ROS otherwise negative.  HOME MEDS: Current Outpatient Medications  Medication Sig Dispense Refill   digoxin (LANOXIN) 0.125 MG tablet TAKE ONE TABLET (0.125MG  TOTAL) BY MOUTHDAILY 90 tablet 1   ELIQUIS 5 MG TABS tablet TAKE ONE TABLET BY MOUTH TWICE A DAY 180 tablet 1   Fluticasone-Umeclidin-Vilant (TRELEGY ELLIPTA) 100-62.5-25 MCG/INH AEPB Inhale 1 puff into the lungs daily. 30 each 0   furosemide (LASIX) 40 MG tablet Take 1 tablet (40 mg total) by mouth daily. 90 tablet 2   guaiFENesin (MUCINEX) 600 MG 12 hr tablet Take 1 tablet (600 mg total) by mouth 2 (two) times daily. 60 tablet 0   metoprolol succinate (TOPROL-XL) 25 MG 24 hr tablet Take 1 tablet (25 mg total) by mouth daily. Take with or immediately following a meal. 90 tablet 3   mirtazapine (REMERON) 30 MG tablet Take 30 mg by mouth at bedtime.      sacubitril-valsartan (ENTRESTO) 49-51 MG Take 1 tablet by mouth 2 (two) times daily. 60 tablet 5   tamsulosin (FLOMAX) 0.4 MG CAPS capsule Take 0.4 mg by mouth every evening.      vitamin B-12 (CYANOCOBALAMIN) 1000 MCG tablet Take 1,000 mcg by mouth daily.     VITAMIN B1-B12 IJ Inject 1 Dose as directed every 30 (thirty) days.     VITAMIN C, CALCIUM ASCORBATE, PO Take 1,000 mg by mouth daily.      VITAMIN D, CHOLECALCIFEROL, PO Take 1,000 mg by mouth daily.      No current facility-administered medications for this visit.     LABS/IMAGING: No results found for this or any previous visit (from the past 48 hour(s)). No results found.  VITALS: BP 130/70    Pulse 76    Temp (!) 97.5 F (36.4 C) (Temporal)    Ht 5\' 6"  (1.676 m)    Wt 166 lb 12.8 oz (75.7 kg)    SpO2 94%    BMI 26.92 kg/m   EXAM: General appearance: alert and no distress Neck: no carotid bruit, no JVD and thyroid not enlarged, symmetric, no tenderness/mass/nodules Lungs: clear to auscultation bilaterally Heart: irregularly  irregular rhythm, S1, S2 normal, systolic murmur: systolic ejection 2/6, crescendo at 2nd right intercostal space and possibly a diastolic mumur Abdomen: soft, non-tender; bowel sounds normal; no masses,  no organomegaly Extremities: edema 1+ left ankle edema and venous stasis dermatitis noted Pulses: 2+ and symmetric Skin: Skin color, texture, turgor normal. No rashes or lesions Neurologic: Grossly normal Psych: Pleasant  EKG: A. fib with PVCs at 76, inferior and anterolateral T wave changes-personally reviewed  ASSESSMENT: 1. Acute on chronic systolic congestive heart failure, LVEF 20-25% (improved to 50-55% - 11/2017), NYHA class II heart failure symptoms 2. Status post St. Jude Accent DR pacemaker for sinus arrest - VVIR 3. Labile hypertension - controlled 4. Chronic anticoagulation on Eliquis 5. COPD -noncompliant with inhalers 6. Aortic stenosis - mild to moderate (03/2016), with moderate AI  PLAN: 1.   Mr. Bleicher has a degree of acute on chronic systolic congestive heart failure with recent weight gain and lower extremity edema.  This is likely due to medication noncompliance as he reports is not taking with Lasix.  Although his EF  has normalized he has some diastolic dysfunction and given his COPD likely has a propensity for fluid retention.  I recommend he restart his diuretics and monitor daily weights.  This can be followed up by Dr. Loletha Grayer during his visit next Monday for in-office device check.  Plan follow-up in 6 months or sooner as necessary.  Pixie Casino, MD, [facility], [place] Director of the [facility]  [facility] Attending Cardiologist  Direct Dial: [phone]   Fax: [phone]  Website:  [email or web address] 10/03/2018, 9:13 AM

## 2018-10-03 NOTE — Progress Notes (Signed)
Will do. Thanks.

## 2018-10-08 ENCOUNTER — Ambulatory Visit (INDEPENDENT_AMBULATORY_CARE_PROVIDER_SITE_OTHER): Payer: Medicare Other | Admitting: Cardiovascular Disease

## 2018-10-08 ENCOUNTER — Other Ambulatory Visit: Payer: Self-pay

## 2018-10-08 VITALS — BP 129/73 | HR 74 | Temp 97.4°F | Ht 66.0 in | Wt 164.4 lb

## 2018-10-08 DIAGNOSIS — Z950 Presence of cardiac pacemaker: Secondary | ICD-10-CM

## 2018-10-08 DIAGNOSIS — Z7901 Long term (current) use of anticoagulants: Secondary | ICD-10-CM | POA: Diagnosis not present

## 2018-10-08 DIAGNOSIS — I5042 Chronic combined systolic (congestive) and diastolic (congestive) heart failure: Secondary | ICD-10-CM

## 2018-10-08 DIAGNOSIS — I4821 Permanent atrial fibrillation: Secondary | ICD-10-CM | POA: Diagnosis not present

## 2018-10-08 NOTE — Patient Instructions (Signed)

## 2018-10-08 NOTE — Progress Notes (Signed)
Patient ID: Cody George, male   DOB: 14-May-1930, 85 y.o.   MRN: [id number]    Cardiology Office Note    Date:  10/09/2018   ID:  Cody George, DOB 02-04-1931, MRN [id number]  PCP:  Celene Squibb, MD  Cardiologist:  Raliegh Ip. [name] HILTY, MD; Sanda Klein, MD   Chief Complaint  Patient presents with  . Pacemaker Check    History of Present Illness:  Cody George is a 85 y.o. male with a history of paroxysmal atrial fibrillation and sinus node arrest who received a dual-chamber pacemaker in 2013.  He has subsequently progressed to permanent atrial fibrillation.  His wife of 67 years recently passing having some trouble adjusting.  He saw Dr. Debara Pickett in clinic last week and he was having some problems with edema.  He was not taking his furosemide every day to avoid urinating all the time.  Over the weekend he has restarted his diuretic on a daily basis and he is already lost 3 pounds and now has only trace retromalleolar edema.  The patient specifically denies any chest pain at rest exertion, dyspnea at rest or with exertion, orthopnea, paroxysmal nocturnal dyspnea, syncope, palpitations, focal neurological deficits, intermittent claudication, lower extremity edema, unexplained weight gain, cough, hemoptysis or wheezing.  He has not had any recent falls, injuries or bleeding events.  His device was implanted in 2013 ([place]) and still has roughly 4.5 years of longevity.  He has a 78% ventricular pacing despite the fact that he does not take any medicines with negative chronotropic effect.  He has not had any episodes of high ventricular rate.  Lead parameters are normal.  Echo performed in October 2018 showed moderately to severely depressed left ventricular systolic function with ejection fraction of 30% with diffuse hypokinesis and mild to moderate LVH. There is mild to moderate aortic stenosis (mean gradient 11 mmHg, valve area 1.2 cm). The left atrium was severely dilated 50  mm.   He had isolated mild swelling in his left upper extremity and showed some evidence of collateral vein circulation on the anterior chest and shoulder, consistent with subclavian vein occlusion, but this has resolved completely. He no longer has any swelling and the collateral veins have shrunk..  Past Medical History:  Diagnosis Date  . Acute systolic congestive heart failure ([place]) 12/07/2015  . Arthritis   . Cellulitis   . Community acquired pneumonia 04/14/2014  . COPD mixed type ([place]) 12/07/2016  . Deep vein thrombosis (DVT) of left upper extremity ([place]) 04/13/2016  . Depression 01/06/2016  . Dysrhythmia    PAF  . GERD (gastroesophageal reflux disease)   . History of nuclear stress test 10/05/2010   dipyridamole; normal pattern of perfusion in all regions; no significant ishcemia, low risk scan   . Hypertension   . OA (osteoarthritis)   . Pacemaker 07/22/2011   St. 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 Paroxysmal atrial fibrillation ([facility])   . Prostate cancer ([place])    radiation  . Shortness of breath     Past Surgical History:  Procedure Laterality Date  . CARDIOVERSION N/A 12/07/2015   Procedure: CARDIOVERSION;  Surgeon: Pixie Casino, MD;  Location: [facility] ENDOSCOPY;  Service: Cardiovascular;  Laterality: N/A;  . HERNIA REPAIR Bilateral   . INGUINAL HERNIA REPAIR Left 06/10/2015   Procedure: HERNIA REPAIR INGUINAL ADULT WITH MESH;  Surgeon: Aviva Signs, MD;  Location: [facility];  Service: General;  Laterality:  Left;  Pt notified to arrive at 6:15am KF  . NM MYOCAR PERF WALL MOTION  10/05/10   normal  . PACEMAKER INSERTION  07/22/2011   St. 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 PERMANENT PACEMAKER INSERTION N/A 07/22/2011   Procedure: PERMANENT PACEMAKER INSERTION;  Surgeon: Sanda Klein, MD;  Location: [facility] CATH LAB;  Service: Cardiovascular;  Laterality: N/A;  . PILONIDAL CYST DRAINAGE      x2  . TEE WITHOUT CARDIOVERSION N/A 12/07/2015   Procedure: TRANSESOPHAGEAL ECHOCARDIOGRAM (TEE);  Surgeon: Pixie Casino, MD;  Location: [facility] ENDOSCOPY;  Service: Cardiovascular;  Laterality: N/A;  . TRANSTHORACIC ECHOCARDIOGRAM  10/05/2010   EF=50-55%, normal LV systolic function; LA & RA mildly dilated; mild MR; mild TR with RV systolic pressure elevted at 30-16mmHg; AV mildly sclerotic with mild calcif of AV leaflets, mild valvular AS, mild-mod regurg; trace pulm valve regurg  . US ECHOCARDIOGRAPHY  10/05/10   LA mildly dilated, mild TR, mild ca+ of AOV ,mild to mod. AI    Outpatient Medications Prior to Visit  Medication Sig Dispense Refill  . digoxin (LANOXIN) 0.125 MG tablet TAKE ONE TABLET (0.125MG  TOTAL) BY MOUTHDAILY 90 tablet 1  . ELIQUIS 5 MG TABS tablet TAKE ONE TABLET BY MOUTH TWICE A DAY 180 tablet 1  . Fluticasone-Umeclidin-Vilant (TRELEGY ELLIPTA) 100-62.5-25 MCG/INH AEPB Inhale 1 puff into the lungs daily. 30 each 0  . furosemide (LASIX) 40 MG tablet Take 1 tablet (40 mg total) by mouth daily. 90 tablet 2  . guaiFENesin (MUCINEX) 600 MG 12 hr tablet Take 1 tablet (600 mg total) by mouth 2 (two) times daily. 60 tablet 0  . metoprolol succinate (TOPROL-XL) 25 MG 24 hr tablet Take 1 tablet (25 mg total) by mouth daily. Take with or immediately following a meal. 90 tablet 3  . mirtazapine (REMERON) 30 MG tablet Take 30 mg by mouth at bedtime.     . sacubitril-valsartan (ENTRESTO) 49-51 MG Take 1 tablet by mouth 2 (two) times daily. 60 tablet 5  . tamsulosin (FLOMAX) 0.4 MG CAPS capsule Take 0.4 mg by mouth every evening.     . vitamin B-12 (CYANOCOBALAMIN) 1000 MCG tablet Take 1,000 mcg by mouth daily.    Marland Kitchen VITAMIN C, CALCIUM ASCORBATE, PO Take 1,000 mg by mouth daily.     Marland Kitchen VITAMIN D, CHOLECALCIFEROL, PO Take 1,000 mg by mouth daily.     Marland Kitchen VITAMIN B1-B12 IJ Inject 1 Dose as directed every 30 (thirty) days.     No facility-administered medications prior to visit.       Allergies:   Patient has no known allergies.   Social History   Socioeconomic History  . Marital status: Married    Spouse name: Not on file  . Number of children: 5  . Years of education: Not on file  . Highest education level: Not on file  Occupational History    Employer: RETIRED  Social Needs  . Financial resource strain: Not on file  . Food insecurity    Worry: Not on file    Inability: Not on file  . Transportation needs    Medical: Not on file    Non-medical: Not on file  Tobacco Use  . Smoking status: Current Every Day Smoker    Packs/day: 2.00    Years: 72.00    Pack years: 144.00  . Smokeless tobacco: Never Used  . Tobacco comment: now 1/2 ppd (07/09/14)  Substance and Sexual Activity  .  Alcohol use: No    Alcohol/week: 0.0 standard drinks  . Drug use: No  . Sexual activity: Not Currently  Lifestyle  . Physical activity    Days per week: Not on file    Minutes per session: Not on file  . Stress: Not on file  Relationships  . Social Herbalist on phone: Not on file    Gets together: Not on file    Attends religious service: Not on file    Active member of club or organization: Not on file    Attends meetings of clubs or organizations: Not on file    Relationship status: Not on file  Other Topics Concern  . Not on file  Social History Narrative  . Not on file     ROS:   Please see the history of present illness.    ROS All other systems are reviewed and are negative   PHYSICAL EXAM:   VS:  BP 129/73   Pulse 74   Temp (!) 97.4 F (36.3 C)   Ht 5\' 6"  (1.676 m)   Wt 164 lb 6.4 oz (74.6 kg)   SpO2 92%   BMI 26.53 kg/m      General: Alert, oriented x3, no distress, appears well.  Well-healed left subclavian pacemaker site. Head: no evidence of trauma, PERRL, EOMI, no exophtalmos or lid lag, no myxedema, no xanthelasma; normal ears, nose and oropharynx Neck: normal jugular venous pulsations and no hepatojugular reflux; brisk carotid  pulses without delay and no carotid bruits Chest: clear to auscultation, no signs of consolidation by percussion or palpation, normal fremitus, symmetrical and full respiratory excursions Cardiovascular: normal position and quality of the apical impulse, regular rhythm, normal first and paradoxically split second heart sounds, no murmurs, rubs or gallops Abdomen: no tenderness or distention, no masses by palpation, no abnormal pulsatility or arterial bruits, normal bowel sounds, no hepatosplenomegaly Extremities: no clubbing, cyanosis; he has trivial symmetrical retromalleolar edema; 2+ radial, ulnar and brachial pulses bilaterally; 2+ right femoral, posterior tibial and dorsalis pedis pulses; 2+ left femoral, posterior tibial and dorsalis pedis pulses; no subclavian or femoral bruits Neurological: grossly nonfocal Psych: Appropriate grief response.  Becomes teary-eyed when talking of his wife.    Wt Readings from Last 3 Encounters:  10/08/18 164 lb 6.4 oz (74.6 kg)  10/03/18 166 lb 12.8 oz (75.7 kg)  12/27/17 159 lb (72.1 kg)      Studies/Labs Reviewed:   EKG:  EKG is not ordered today.  ECG from 10/01/2018 shows atrial fibrillation with occasional ventricular paced beats and widespread nonspecific ST-T changes  Recent Labs: 12/27/2017: BUN 15; Creatinine, Ser 0.82; Potassium 4.4; Sodium 137   Lipid Panel    Component Value Date/Time   CHOL 131 12/21/2012 0820   TRIG 68 12/21/2012 0820   HDL 53 12/21/2012 0820   CHOLHDL 2.5 12/21/2012 0820   VLDL 14 12/21/2012 0820   LDLCALC 64 12/21/2012 0820      ASSESSMENT:    1. Permanent atrial fibrillation   2. Chronic combined systolic and diastolic heart failure ([place])   3. Pacemaker   4. Long term (current) use of anticoagulants      PLAN:  In order of problems listed above:  1. AFIB: This now appears to be a permanent arrhythmia and rhythm change never seem to have a big impact on functional status.  In fact he felt worse  when he was taking amiodarone that he does now.  He has spontaneously  slow ventricular rates.  He is compliant with anticoagulation. CHADSVasc 5 (age 21, CHF, CAD, HTN).  Upgrade to CRT-P remains an option if his heart failure complaints worsen. 2. CHF: Improved after starting to take his diuretic daily.  Reviewed the importance of daily weight monitoring and sodium restriction.  He is on maximum dose Entresto.  Avoiding beta-blocker since this would lead to even higher percentage of ventricular pacing. Upgrade to CRT-P remains an option if his heart failure complaints worsen. 3. PM: Normal device function.  Continue remote downloads every 3 months.  The device is programmed to VVIR for what is now permanent atrial fibrillation. 4. Eliquis, well-tolerated without bleeding problems   Medication Adjustments/Labs and Tests Ordered: Current medicines are reviewed at length with the patient today.  Concerns regarding medicines are outlined above.  Medication changes, Labs and Tests ordered today are listed in the Patient Instructions below. Patient Instructions  Medication Instructions:  Your physician recommends that you continue on your current medications as directed. Please refer to the Current Medication list given to you today.  If you need a refill on your cardiac medications before your next appointment, please call your pharmacy.   Lab work: None ordered If you have labs (blood work) drawn today and your tests are completely normal, you will receive your results only by: [place] (if you have MyChart) OR A paper copy in the mail If you have any lab test that is abnormal or we need to change your treatment, we will call you to review the results.  Testing/Procedures: None ordered  Follow-Up: At [facility], you and your health needs are our priority.  As part of our continuing mission to provide you with exceptional heart care, we have created designated Provider Care Teams.   These Care Teams include your primary Cardiologist (physician) and Advanced Practice Providers (APPs -  Physician Assistants and Nurse Practitioners) who all work together to provide you with the care you need, when you need it. You will need a follow up appointment in 12 months.  Please call our office 2 months in advance to schedule this appointment.  You may see Sanda Klein, MD or one of the following Advanced Practice Providers on your designated Care Team: Almyra Deforest, PA-C Fabian Sharp, [place]             Signed, Sanda Klein, MD  10/09/2018 [address], [place], [place]  [address] Phone: [phone]; Fax: [phone]

## 2018-10-09 ENCOUNTER — Encounter: Payer: Self-pay | Admitting: Cardiovascular Disease

## 2018-12-07 DIAGNOSIS — I509 Heart failure, unspecified: Secondary | ICD-10-CM | POA: Diagnosis not present

## 2018-12-07 DIAGNOSIS — D519 Vitamin B12 deficiency anemia, unspecified: Secondary | ICD-10-CM | POA: Diagnosis not present

## 2018-12-07 DIAGNOSIS — J449 Chronic obstructive pulmonary disease, unspecified: Secondary | ICD-10-CM | POA: Diagnosis not present

## 2018-12-07 DIAGNOSIS — Z0000 Encounter for general adult medical examination without abnormal findings: Secondary | ICD-10-CM | POA: Diagnosis not present

## 2018-12-07 DIAGNOSIS — J441 Chronic obstructive pulmonary disease with (acute) exacerbation: Secondary | ICD-10-CM | POA: Diagnosis not present

## 2018-12-11 ENCOUNTER — Ambulatory Visit (INDEPENDENT_AMBULATORY_CARE_PROVIDER_SITE_OTHER): Payer: Medicare Other | Admitting: *Deleted

## 2018-12-11 DIAGNOSIS — I4891 Unspecified atrial fibrillation: Secondary | ICD-10-CM

## 2018-12-11 DIAGNOSIS — I495 Sick sinus syndrome: Secondary | ICD-10-CM

## 2018-12-11 LAB — CUP PACEART REMOTE DEVICE CHECK
Date Time Interrogation Session: 20201027124006
Implantable Lead Implant Date: 20130607
Implantable Lead Implant Date: 20130607
Implantable Lead Location: 753859
Implantable Lead Location: 753860
Implantable Pulse Generator Implant Date: 20130607
Pulse Gen Model: 2210
Pulse Gen Serial Number: 7353349

## 2018-12-17 DIAGNOSIS — D519 Vitamin B12 deficiency anemia, unspecified: Secondary | ICD-10-CM | POA: Diagnosis not present

## 2018-12-17 DIAGNOSIS — J449 Chronic obstructive pulmonary disease, unspecified: Secondary | ICD-10-CM | POA: Diagnosis not present

## 2018-12-17 DIAGNOSIS — Z0000 Encounter for general adult medical examination without abnormal findings: Secondary | ICD-10-CM | POA: Diagnosis not present

## 2018-12-17 DIAGNOSIS — I509 Heart failure, unspecified: Secondary | ICD-10-CM | POA: Diagnosis not present

## 2018-12-19 ENCOUNTER — Telehealth: Payer: Self-pay | Admitting: Internal Medicine

## 2018-12-19 NOTE — Telephone Encounter (Signed)
eliquis patient assistance application mailed with instructions to complete & return to Dr. Lysbeth Penner office

## 2018-12-20 ENCOUNTER — Other Ambulatory Visit: Payer: Self-pay

## 2018-12-20 MED ORDER — SACUBITRIL-VALSARTAN 49-51 MG PO TABS: 1.0000 | ORAL_TABLET | Freq: Two times a day (BID) | ORAL | 5 refills | Status: DC

## 2018-12-21 ENCOUNTER — Other Ambulatory Visit: Payer: Self-pay

## 2018-12-21 MED ORDER — SACUBITRIL-VALSARTAN 49-51 MG PO TABS: 1.0000 | ORAL_TABLET | Freq: Two times a day (BID) | ORAL | 5 refills | Status: AC

## 2019-01-01 NOTE — Progress Notes (Signed)
Remote pacemaker transmission.   

## 2019-01-03 ENCOUNTER — Telehealth: Payer: Self-pay | Admitting: Internal Medicine

## 2019-01-03 NOTE — Telephone Encounter (Signed)
entresto patient assistance application mailed to patient with instructions to complete and return - MD Rx portion has been completed.

## 2019-01-18 NOTE — Telephone Encounter (Signed)
Application faxed

## 2019-01-26 ENCOUNTER — Other Ambulatory Visit: Payer: Self-pay | Admitting: Internal Medicine

## 2019-01-31 ENCOUNTER — Other Ambulatory Visit: Payer: Self-pay

## 2019-01-31 MED ORDER — APIXABAN 5 MG PO TABS: 5.0000 mg | ORAL_TABLET | Freq: Two times a day (BID) | ORAL | 0 refills | Status: DC

## 2019-02-06 DIAGNOSIS — I509 Heart failure, unspecified: Secondary | ICD-10-CM | POA: Diagnosis not present

## 2019-02-06 DIAGNOSIS — H6121 Impacted cerumen, right ear: Secondary | ICD-10-CM | POA: Diagnosis not present

## 2019-02-06 DIAGNOSIS — I482 Chronic atrial fibrillation, unspecified: Secondary | ICD-10-CM | POA: Diagnosis not present

## 2019-02-06 DIAGNOSIS — E441 Mild protein-calorie malnutrition: Secondary | ICD-10-CM | POA: Diagnosis not present

## 2019-02-06 DIAGNOSIS — J441 Chronic obstructive pulmonary disease with (acute) exacerbation: Secondary | ICD-10-CM | POA: Diagnosis not present

## 2019-02-06 DIAGNOSIS — J449 Chronic obstructive pulmonary disease, unspecified: Secondary | ICD-10-CM | POA: Diagnosis not present

## 2019-02-11 DIAGNOSIS — Z950 Presence of cardiac pacemaker: Secondary | ICD-10-CM | POA: Diagnosis not present

## 2019-02-11 DIAGNOSIS — J439 Emphysema, unspecified: Secondary | ICD-10-CM | POA: Diagnosis not present

## 2019-02-11 DIAGNOSIS — I5021 Acute systolic (congestive) heart failure: Secondary | ICD-10-CM | POA: Diagnosis not present

## 2019-02-11 DIAGNOSIS — I480 Paroxysmal atrial fibrillation: Secondary | ICD-10-CM | POA: Diagnosis not present

## 2019-02-22 DIAGNOSIS — J441 Chronic obstructive pulmonary disease with (acute) exacerbation: Secondary | ICD-10-CM | POA: Diagnosis not present

## 2019-02-22 DIAGNOSIS — I482 Chronic atrial fibrillation, unspecified: Secondary | ICD-10-CM | POA: Diagnosis not present

## 2019-02-22 DIAGNOSIS — J449 Chronic obstructive pulmonary disease, unspecified: Secondary | ICD-10-CM | POA: Diagnosis not present

## 2019-02-22 DIAGNOSIS — I509 Heart failure, unspecified: Secondary | ICD-10-CM | POA: Diagnosis not present

## 2019-02-25 NOTE — Telephone Encounter (Signed)
eliquis assistance approved until 02/14/2020

## 2019-03-12 ENCOUNTER — Ambulatory Visit (INDEPENDENT_AMBULATORY_CARE_PROVIDER_SITE_OTHER): Payer: Medicare Other | Admitting: *Deleted

## 2019-03-12 DIAGNOSIS — I4891 Unspecified atrial fibrillation: Secondary | ICD-10-CM

## 2019-03-12 LAB — CUP PACEART REMOTE DEVICE CHECK
Battery Remaining Longevity: 55 mo
Battery Remaining Percentage: 51 %
Battery Voltage: 2.87 V
Brady Statistic RV Percent Paced: 79 %
Date Time Interrogation Session: 20210126030541
Implantable Lead Implant Date: 20130607
Implantable Lead Implant Date: 20130607
Implantable Lead Location: 753859
Implantable Lead Location: 753860
Implantable Pulse Generator Implant Date: 20130607
Lead Channel Impedance Value: 360 Ohm
Lead Channel Pacing Threshold Amplitude: 1 V
Lead Channel Pacing Threshold Pulse Width: 0.5 ms
Lead Channel Sensing Intrinsic Amplitude: 12 mV
Lead Channel Setting Pacing Amplitude: 2.5 V
Lead Channel Setting Pacing Pulse Width: 0.5 ms
Lead Channel Setting Sensing Sensitivity: 2 mV
Pulse Gen Model: 2210
Pulse Gen Serial Number: 7353349

## 2019-03-20 NOTE — Telephone Encounter (Signed)
Patient is approved for entresto from [employer] patient [employer]

## 2019-03-25 DIAGNOSIS — H6121 Impacted cerumen, right ear: Secondary | ICD-10-CM | POA: Diagnosis not present

## 2019-03-25 DIAGNOSIS — H9190 Unspecified hearing loss, unspecified ear: Secondary | ICD-10-CM | POA: Diagnosis not present

## 2019-04-11 DIAGNOSIS — J449 Chronic obstructive pulmonary disease, unspecified: Secondary | ICD-10-CM | POA: Diagnosis not present

## 2019-04-11 DIAGNOSIS — I482 Chronic atrial fibrillation, unspecified: Secondary | ICD-10-CM | POA: Diagnosis not present

## 2019-04-11 DIAGNOSIS — I509 Heart failure, unspecified: Secondary | ICD-10-CM | POA: Diagnosis not present

## 2019-04-11 DIAGNOSIS — J441 Chronic obstructive pulmonary disease with (acute) exacerbation: Secondary | ICD-10-CM | POA: Diagnosis not present

## 2019-04-15 ENCOUNTER — Telehealth: Payer: Self-pay | Admitting: Internal Medicine

## 2019-04-15 NOTE — Telephone Encounter (Signed)
New Message:     Daughter called and said she will need to come in with him for his appt this week. She says pt can not hear well, thereofre he can not understand what is being said to him,

## 2019-04-15 NOTE — Telephone Encounter (Signed)
Will route to MD as unsure of how he feels regarding visitors.  Thank you!

## 2019-04-15 NOTE — Telephone Encounter (Signed)
LM for daughter Cody George that it is OK to come to appt

## 2019-04-17 ENCOUNTER — Other Ambulatory Visit: Payer: Self-pay

## 2019-04-17 ENCOUNTER — Encounter: Payer: Self-pay | Admitting: Internal Medicine

## 2019-04-17 ENCOUNTER — Ambulatory Visit: Payer: Medicare Other | Admitting: Internal Medicine

## 2019-04-17 VITALS — BP 140/80 | HR 93 | Ht 70.0 in | Wt 160.0 lb

## 2019-04-17 DIAGNOSIS — I5042 Chronic combined systolic (congestive) and diastolic (congestive) heart failure: Secondary | ICD-10-CM | POA: Diagnosis not present

## 2019-04-17 DIAGNOSIS — J441 Chronic obstructive pulmonary disease with (acute) exacerbation: Secondary | ICD-10-CM

## 2019-04-17 DIAGNOSIS — I4819 Other persistent atrial fibrillation: Secondary | ICD-10-CM

## 2019-04-17 DIAGNOSIS — Z950 Presence of cardiac pacemaker: Secondary | ICD-10-CM | POA: Diagnosis not present

## 2019-04-17 NOTE — Progress Notes (Signed)
OFFICE NOTE  Chief Complaint:  Shortness of breath  Primary Care Physician: Celene Squibb, MD  HPI:  Cody George is an 86 year old gentleman who has a history of atrial fibrillation and tachy-brady syndrome as well as paroxysmal atrial fibrillation and sinus node arrest. He underwent a St. Jude Accent DR dual-chamber pacemaker in June of 2013. He has done fairly well other than he had some hypotension on lisinopril which was discontinued. He has recently had some tachyarrhythmias and had increased his metoprolol succinate to 25 mg daily due to tachyarrhythmias. He is enrolled in a home monitoring service with the [facility] patient care network. He was successfully transitioned over to Eliquis she is taking without any bleeding difficulty. He denies any shortness of breath or worsening chest pain. He is due for a remote pacer check in a few days.  Cody George returns for follow-up today. He is feeling well denies any chest pain or worsening shortness of breath. He has no palpitations. Blood pressure is well controlled. He had recent bilateral cataract surgery and is seeing much better. There are no bleeding issues.  I saw Cody George back in the office today for follow-up. Tells me a few months ago he had pneumonia and has been very slow to recover. Chest x-ray shows improvement however there are changes consistent with COPD. He has a greater than 65 year history of smoking. I do not believe he carries a diagnosis of COPD and is currently not on any medications for this. He reports some worsening shortness of breath as well has fatigue. He denies any particular chest pain. He does have valvular heart disease and history of aortic stenosis. Recent echo in March shows some worsening of valvular function with now moderate aortic stenosis.  Cody George returns today for follow-up. Overall he is without complaints. He scheduled to see Dr. Loletha Grayer in the office next week for follow-up of his pacemaker.  He is atrially paced today. He had a low burden of atrial fibrillation but remains on Eliquis. He is asymptomatic with this. He's had no bleeding problems on Eliquis. Blood pressure is high normal today 152/82. He says that generally is between [id number] and [id number] systolic at home. I reminded him that his prior echocardiogram showed what may be moderate aortic stenosis. Interestingly, I have not been able to auscultate that on exam. His prior echo was almost a year ago and will be due for repeat echo this year.  11/04/2015  Cody George was seen today in the office. He reports that over the past several months she's had a significant decline. He has had worsening energy and fatigue as well as shortness of breath. He can only walk short distances before becoming significantly short of breath. He is also developed a mildly productive cough with a tannish sputum. He is concerned about whether he might have had recurrent pneumonia. He does have a history of COPD and pneumonia in the past. An echocardiogram was performed and March 2017 which showed mild to moderate aortic stenosis by valve gradient however visually the aortic valve appeared to be moderate to severely stenosed. On exam is very difficult with his COPD and hyperexpansion to auscultate his aortic murmur and I cannot reliably hear the second heart sound. Cody George appeared to be short of breath today and was unable to finish sentences at times. He denies any fevers or chills at home. He has had some mild recent weight loss. He also complains of lower extremity swelling,  which is worse on the left leg than the right. There is some orthopnea.  11/16/2015  Cody George returns today for follow-up. He reports feeling much better. His weight has come down several pounds and he notes marked improvement in his edema. His shortness of breath is also improved and he feels much more energized. Unfortunately his echocardiogram shows a newly reduced LVEF of 20-25% with  global hypokinesis. 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 His chest x-ray showed no evidence of pneumonia.  01/06/2016  Cody George was seen today in follow-up. He underwent cardioversion after loading with amiodarone which required 3 shocks but ultimately he converted back to sinus rhythm. Today he is in an atrial paced rhythm but appears to be maintaining sinus. Unfortunately he feels "horrible". It's not clear why he feels worse today but has felt better in the past. He says some days are good and some days are bad. He also became very tearful in the office today. He is still quite upset about the death of a family member last year. I'm concerned that he is struggling from depression. He does not appear volume overloaded on exam. Now that he is back in sinus rhythm, his cardiac output should be better but he still reports fatigue, poor sleep at night, lack of interest in things, tearfulness and other symptoms which may be more likely to be depression.  04/13/2016  Cody George returns today for follow-up. He is doing markedly better. His color is improved significantly and so is his attitude. He has generally maintained a sinus rhythm since cardioversion on 12/07/2015. He was on amiodarone but had significant side effects with this. The medication was then wean down and discontinued. His overall burden of atrial fibrillation was about 27%. In general it seems like his A. fib may be playing a role in his fatigue. He recently has been describing some left upper extremity swelling. He saw Dr. Loletha Grayer, who felt that this could be occlusion of the left subclavian vein related to the pacemaker leads with possibly super impose thrombus. Today he reports is been no significant change in his swelling despite being on Eliquis for more than 3 months. He has been trying to keep his arm  elevated.  10/11/2016  Cody George returns today for follow-up. He recently saw Dr. Loletha Grayer for interrogation of his pacemaker. He had an echo in February which showed improvement in EF from 20-25% up to 40%. He says he feels great and in fact if he didn't have ongoing back pain he be in very good shape. He's had resolution of his upper extremity swelling. He does get some right lower extremity swelling. He has hemosiderin deposition of the right lower extremity and some superficial varicosities suggesting poor venous return. He notes marked improvement when elevating his foot on aggressive suspect this is venous insufficiency. He may benefit from wearing compression stocking. He denies any further syncope. He has no chest pain or worsening shortness of breath.  12/22/2016  Cody George returns today for hospital follow-up.  He was recently hospitalized in any pain in [place] for A. fib with RVR and acute congestive heart failure.  He required IV diuretics and medication adjustment for rate control.  He lost about 10 pounds with diuresis and says his breathing is better however he feels very fatigued and has almost no energy.  On discharge, surprisingly he was told to increase his Toprol-XL from  50 mg daily to 300 mg or 3 tablets twice daily.  Passive dose change and I suspect may be a medication error.  He said he took this for a couple days and felt terrible and then went back to taking Toprol-XL 50 mg daily.  On the day prior to discharge his schedule his meds indicated he was taking metoprolol succinate 150 mg twice daily, so it seems that he was on this increased dose.  Dr. Domenic Polite had recommended increasing the dose to 100 in the morning and 50 at night.  He was also started on digoxin.  Heart rate and blood pressure appear normal today.  02/13/2017  Cody George returns today for follow-up.  He continues to feel fatigued.  He says his energy level is not good.  He is in more persistent atrial  fibrillation and recently his pacemaker was changed to VVIR.  I did lab work which indicates no evidence of anemia however BNP is elevated.  Creatinine is stable.  His LVEF has declined to 30%.  He recently had an elevated digoxin level and I decreased his digoxin intake.  His repeat level was 0.7.  03/17/2017  Cody George returns today for follow-up.  He actually looks great today.  He is much more alert and awake.  He is more in a joking mood.  He has had about 11 pound weight gain since I saw him however he had lost a lot of weight and still is within normal limits.  He says he feels much better and denies any shortness of breath.  There is no evidence of any edema.  I suspect that his appetite is improved and he reported that is the case.  He has been taking the Entresto now for over a month and feels like it is helping him.  We discussed the importance of trying to increase the dose as tolerated.  He has at least another month of samples which time we will hopefully step him up to the 49/51 mg dose.  He had a recent remote patient check which was normal.  06/14/2017  Cody George is seen today in follow-up.  He seems to be in a really good mood today and is accompanied by another 1 of his daughters.  He denies any chest pain or shortness of breath however his daughter noted that he has been complaining of shortness of breath at home.  Interestingly she says that he is not been compliant with his inhaler for COPD.  That certainly could explain things.  He seems to be taking the Entresto, though blood pressure is well controlled today.  Weight has been stable and is within a pound of his weight 3 months ago.  He denies any bleeding problems on Eliquis.  Recent remote check of his device shows normal function.  He is in permanent atrial fibrillation.  12/27/2017  Cody George returns today for follow-up.  In general he is feeling much better.  In fact to me looks remarkably better compared to when I saw him  about a year and 1/2 to 2 years ago.  At that time he was very depressed he had low energy and was significantly fatigued.  EF had been as low as 30% however came up to 40% last year.  His most recent echo performed about 1 month ago showed his EF up to 50 to 55%.  He does have moderate aortic insufficiency and mild aortic stenosis which will need to follow.  Overall his energy level is  good and he endorses and most NYHA class II symptoms.  He is on moderate dose Entresto and blood pressure will not likely allow increased to the high dose.  He also remains on digoxin and Toprol-XL 25 mg daily (this dose was recently decreased by Dr. Loletha Grayer to allow less frequent pacing).  His most recent pacemaker interrogation indicated 80% V pacing.  10/03/2018  Cody George is seen today in follow-up.  Overall he says he feels well.  Unfortunately lost his wife in June.  I been married for over 52 years.  Recently his echo in 2019 showed improvement in LVEF up to 50 to 55%.  He still has generally NYHA class II heart failure symptoms.  Recently he has had some weight gain.  In fact he is up about 6 pounds.  On exam today he is noted to be edematous.  He says he is not been taking his Lasix routinely.  He had a remote check recently which showed permanent atrial fibrillation.  He is scheduled for an in office visit with Dr. Loletha Grayer on Monday.  04/17/2019  Cody George is seen today in follow-up.  Over the past couple weeks he has noted some worsening shortness of breath.  He is also had mildly productive cough and some wheezing.  They are concerned about heart failure although his weight is 6 pounds lower than it had been previously.  He has some trace sock line edema.  He is not been using his Lasix on a daily basis rather as needed.  He is on the [place].  LVEF had come up to 50 to 55% previously.  He does have a history of some COPD and was on a Trelegy inhaler, but has been noncompliant with that.  PMHx:  Past Medical History:    Diagnosis Date  . Acute systolic congestive heart failure ([place]) 12/07/2015  . Arthritis   . Cellulitis   . Community acquired pneumonia 04/14/2014  . COPD mixed type ([place]) 12/07/2016  . Deep vein thrombosis (DVT) of left upper extremity ([place]) 04/13/2016  . Depression 01/06/2016  . Dysrhythmia    PAF  . GERD (gastroesophageal reflux disease)   . History of nuclear stress test 10/05/2010   dipyridamole; normal pattern of perfusion in all regions; no significant ishcemia, low risk scan   . Hypertension   . OA (osteoarthritis)   . Pacemaker 07/22/2011   St. 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 Paroxysmal atrial fibrillation (H[facility])   . Prostate cancer ([place])    radiation  . Shortness of breath     Past Surgical History:  Procedure Laterality Date  . CARDIOVERSION N/A 12/07/2015   Procedure: CARDIOVERSION;  Surgeon: Pixie Casino, MD;  Location: [facility] ENDOSCOPY;  Service: Cardiovascular;  Laterality: N/A;  . HERNIA REPAIR Bilateral   . INGUINAL HERNIA REPAIR Left 06/10/2015   Procedure: HERNIA REPAIR INGUINAL ADULT WITH MESH;  Surgeon: Aviva Signs, MD;  Location: [facility] ORS;  Service: General;  Laterality: Left;  Pt notified to arrive at 6:15am KF  . NM MYOCAR PERF WALL MOTION  10/05/10   normal  . PACEMAKER INSERTION  07/22/2011   St. 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 PERMANENT PACEMAKER INSERTION N/A 07/22/2011   Procedure: PERMANENT PACEMAKER INSERTION;  Surgeon: Sanda Klein, MD;  Location: [facility] CATH LAB;  Service: Cardiovascular;  Laterality: N/A;  . PILONIDAL CYST DRAINAGE     x2  . TEE  WITHOUT CARDIOVERSION N/A 12/07/2015   Procedure: TRANSESOPHAGEAL ECHOCARDIOGRAM (TEE);  Surgeon: Pixie Casino, MD;  Location: [facility] ENDOSCOPY;  Service: Cardiovascular;  Laterality: N/A;  . TRANSTHORACIC ECHOCARDIOGRAM  10/05/2010   EF=50-55%, normal LV systolic function; LA & RA mildly dilated; mild MR; mild TR  with RV systolic pressure elevted at 30-37mmHg; AV mildly sclerotic with mild calcif of AV leaflets, mild valvular AS, mild-mod regurg; trace pulm valve regurg  . US ECHOCARDIOGRAPHY  10/05/10   LA mildly dilated, mild TR, mild ca+ of AOV ,mild to mod. AI    FAMHx:  Family History  Problem Relation Age of Onset  . Sudden Cardiac Death Neg Hx     SOCHx:   reports that he has been smoking. He has a 144.00 pack-year smoking history. He has never used smokeless tobacco. He reports that he does not drink alcohol or use drugs.  ALLERGIES:  No Known Allergies  ROS: Pertinent items noted in HPI and remainder of comprehensive ROS otherwise negative.  HOME MEDS: Current Outpatient Medications  Medication Sig Dispense Refill  . apixaban (ELIQUIS) 5 MG TABS tablet Take 1 tablet (5 mg total) by mouth 2 (two) times daily. LAB WORK NEEDED FOR FURTHER REFILLS 180 tablet 0  . digoxin (LANOXIN) 0.125 MG tablet TAKE ONE TABLET (0.125MG  TOTAL) BY MOUTHDAILY 90 tablet 1  . Fluticasone-Umeclidin-Vilant (TRELEGY ELLIPTA) 100-62.5-25 MCG/INH AEPB Inhale 1 puff into the lungs daily. 30 each 0  . furosemide (LASIX) 40 MG tablet Take 1 tablet (40 mg total) by mouth daily. 90 tablet 2  . guaiFENesin (MUCINEX) 600 MG 12 hr tablet Take 1 tablet (600 mg total) by mouth 2 (two) times daily. 60 tablet 0  . metoprolol succinate (TOPROL-XL) 25 MG 24 hr tablet Take 1 tablet (25 mg total) by mouth daily. Take with or immediately following a meal. 90 tablet 3  . mirtazapine (REMERON) 30 MG tablet Take 30 mg by mouth at bedtime.     . sacubitril-valsartan (ENTRESTO) 49-51 MG Take 1 tablet by mouth 2 (two) times daily. 60 tablet 5  . tamsulosin (FLOMAX) 0.4 MG CAPS capsule Take 0.4 mg by mouth every evening.     . vitamin B-12 (CYANOCOBALAMIN) 1000 MCG tablet Take 1,000 mcg by mouth daily.    Marland Kitchen VITAMIN C, CALCIUM ASCORBATE, PO Take 1,000 mg by mouth daily.     Mar[place] Kitchen VITAMIN D, CHOLECALCIFEROL, PO Take 1,000 mg by mouth daily.       No current facility-administered medications for this visit.    LABS/IMAGING: No results found for this or any previous visit (from the past 48 hour(s)). No results found.  VITALS: BP 140/80   Pulse 93   Ht 5\' 10"  (1.778 m)   Wt 160 lb (72.6 kg)   SpO2 91%   BMI 22.96 kg/m   EXAM: General appearance: alert and no distress Neck: no carotid bruit, no JVD and thyroid not enlarged, symmetric, no tenderness/mass/nodules Lungs: diminished breath sounds bilaterally, rhonchi bilaterally and wheezes LUL and RUL Heart: irregularly irregular rhythm, S1, S2 normal, systolic murmur: systolic ejection 2/6, crescendo at 2nd right intercostal space and possibly a diastolic mumur Abdomen: soft, non-tender; bowel sounds normal; no masses,  no organomegaly Extremities: edema trace left ankle edema and venous stasis dermatitis noted Pulses: 2+ and symmetric Skin: Skin color, texture, turgor normal. No rashes or lesions Neurologic: Grossly normal Psych: Pleasant  EKG: A. fib with PVCs at 93, inferior and anterolateral T wave changes-personally reviewed  ASSESSMENT: 1. Acute exacerbation of  COPD 2. Acute on chronic systolic congestive heart failure, LVEF 20-25% (improved to 50-55% - 11/2017), NYHA class II heart failure symptoms 3. Status post St. Jude Accent DR pacemaker for sinus arrest - VVIR 4. Labile hypertension - controlled 5. Chronic anticoagulation on Eliquis 6. COPD -noncompliant with inhalers 7. Aortic stenosis - mild to moderate (03/2016), with moderate AI  PLAN: 1.   Mr. Luebbers has had some progressive dyspnea over the past couple of weeks.  He has had some increase in cough and mucus production but no real orthopnea, weight gain or significant lower extremity edema.  I feel this is more likely acute exacerbation of COPD.  He has been noncompliant with his Trelegy inhaler as he felt like it was not that helpful.  I advised him to restart that.  He may need a rescue inhaler if he  becomes significantly wheezy, but did not seem to be very decompensated today.  I also advised him to try to do the Lasix 3 times a week because of some persistent edema.  We will plan follow-up in 3 months and he has a pacemaker evaluation with Dr. Sallyanne Kuster in 6 months.  Pixie Casino, MD, [facility], [place] Director of the Advanced Lipid Disorders &  Cardiovascular Risk Reduction Clinic Attending Cardiologist  Direct Dial: [phone]  Fax: [phone]  Website:  [email or web address] 04/17/2019, 8:47 AM

## 2019-04-17 NOTE — Patient Instructions (Signed)
Medication Instructions:  Dr. Debara Pickett recommends that you take lasix 3 times weekly (such as Mon, Wed, Fri) Please use Trelegy inhaler as directed  *If you need a refill on your cardiac medications before your next appointment, please call your pharmacy*  Follow-Up: At [facility], you and your health needs are our priority.  As part of our continuing mission to provide you with exceptional heart care, we have created designated Provider Care Teams.  These Care Teams include your primary Cardiologist (physician) and Advanced Practice Providers (APPs -  Physician Assistants and Nurse Practitioners) who all work together to provide you with the care you need, when you need it.  We recommend signing up for the patient portal called "MyChart".  Sign up information is provided on this After Visit Summary.  MyChart is used to connect with patients for Virtual Visits (Telemedicine).  Patients are able to view lab/test results, encounter notes, upcoming appointments, etc.  Non-urgent messages can be sent to your provider as well.   To learn more about what you can do with MyChart, go to [email or web address].    Your next appointment:   3 month(s)  The format for your next appointment:   In Person  Provider:   You may see Pixie Casino, MD or one of the following Advanced Practice Providers on your designated Care Team:    Almyra Deforest, PA-C  Fabian Sharp, PA-C or   Roby Lofts, [place]    Other Instructions

## 2019-04-29 DIAGNOSIS — J441 Chronic obstructive pulmonary disease with (acute) exacerbation: Secondary | ICD-10-CM | POA: Diagnosis not present

## 2019-04-29 DIAGNOSIS — I509 Heart failure, unspecified: Secondary | ICD-10-CM | POA: Diagnosis not present

## 2019-04-29 DIAGNOSIS — J449 Chronic obstructive pulmonary disease, unspecified: Secondary | ICD-10-CM | POA: Diagnosis not present

## 2019-04-29 DIAGNOSIS — I482 Chronic atrial fibrillation, unspecified: Secondary | ICD-10-CM | POA: Diagnosis not present

## 2019-05-18 ENCOUNTER — Other Ambulatory Visit: Payer: Self-pay | Admitting: Internal Medicine

## 2019-06-07 DIAGNOSIS — J449 Chronic obstructive pulmonary disease, unspecified: Secondary | ICD-10-CM | POA: Diagnosis not present

## 2019-06-07 DIAGNOSIS — J441 Chronic obstructive pulmonary disease with (acute) exacerbation: Secondary | ICD-10-CM | POA: Diagnosis not present

## 2019-06-07 DIAGNOSIS — I482 Chronic atrial fibrillation, unspecified: Secondary | ICD-10-CM | POA: Diagnosis not present

## 2019-06-07 DIAGNOSIS — I509 Heart failure, unspecified: Secondary | ICD-10-CM | POA: Diagnosis not present

## 2019-06-11 ENCOUNTER — Ambulatory Visit (INDEPENDENT_AMBULATORY_CARE_PROVIDER_SITE_OTHER): Payer: Medicare Other | Admitting: *Deleted

## 2019-06-11 DIAGNOSIS — I4891 Unspecified atrial fibrillation: Secondary | ICD-10-CM

## 2019-06-11 LAB — CUP PACEART REMOTE DEVICE CHECK
Battery Remaining Longevity: 49 mo
Battery Remaining Percentage: 46 %
Battery Voltage: 2.86 V
Brady Statistic RV Percent Paced: 78 %
Date Time Interrogation Session: 20210427023828
Implantable Lead Implant Date: 20130607
Implantable Lead Implant Date: 20130607
Implantable Lead Location: 753859
Implantable Lead Location: 753860
Implantable Pulse Generator Implant Date: 20130607
Lead Channel Impedance Value: 360 Ohm
Lead Channel Pacing Threshold Amplitude: 1 V
Lead Channel Pacing Threshold Pulse Width: 0.5 ms
Lead Channel Sensing Intrinsic Amplitude: 12 mV
Lead Channel Setting Pacing Amplitude: 2.5 V
Lead Channel Setting Pacing Pulse Width: 0.5 ms
Lead Channel Setting Sensing Sensitivity: 2 mV
Pulse Gen Model: 2210
Pulse Gen Serial Number: 7353349

## 2019-06-12 NOTE — Progress Notes (Signed)
PPM Remote  

## 2019-06-17 ENCOUNTER — Telehealth: Payer: Self-pay | Admitting: Internal Medicine

## 2019-06-17 NOTE — Telephone Encounter (Signed)
Patient's daughter calling to requesting an ABI test to check if there is a blockage in his leg. She states he has trouble getting around.

## 2019-06-17 NOTE — Telephone Encounter (Signed)
Spoke with patients daughter who was asking about ABI's being done Per daughter dad has trouble getting around Has swelling that goes down the the Lasix, denies any pain that she is aware of After discussing with patient she will just discuss with Dr Debara Pickett at follow up in June

## 2019-07-22 ENCOUNTER — Telehealth: Payer: Self-pay | Admitting: Internal Medicine

## 2019-07-22 NOTE — Telephone Encounter (Signed)
New Message:    Daughter called and said she will need to come in with pt for his appt with Dr Debara Pickett on Wednesday(07-24-19)

## 2019-07-22 NOTE — Telephone Encounter (Signed)
Called and advised okay for daughter to come to appointment with 86 year old father.   Daughter verbalized understanding.

## 2019-07-24 ENCOUNTER — Encounter: Payer: Self-pay | Admitting: Internal Medicine

## 2019-07-24 ENCOUNTER — Ambulatory Visit: Payer: Medicare Other | Admitting: Internal Medicine

## 2019-07-24 ENCOUNTER — Other Ambulatory Visit: Payer: Self-pay

## 2019-07-24 VITALS — BP 144/70 | HR 101 | Ht 68.0 in | Wt 161.4 lb

## 2019-07-24 DIAGNOSIS — I428 Other cardiomyopathies: Secondary | ICD-10-CM

## 2019-07-24 DIAGNOSIS — Z950 Presence of cardiac pacemaker: Secondary | ICD-10-CM | POA: Diagnosis not present

## 2019-07-24 DIAGNOSIS — I5042 Chronic combined systolic (congestive) and diastolic (congestive) heart failure: Secondary | ICD-10-CM

## 2019-07-24 DIAGNOSIS — I4819 Other persistent atrial fibrillation: Secondary | ICD-10-CM

## 2019-07-24 NOTE — Progress Notes (Signed)
OFFICE NOTE  Chief Complaint:  Leg swelling  Primary Care Physician: Cody Squibb, MD  HPI:  Cody George is an 86 year old gentleman who has a history of atrial fibrillation and tachy-brady syndrome as well as paroxysmal atrial fibrillation and sinus node arrest. He underwent a St. Jude Accent DR dual-chamber pacemaker in June of 2013. He has done fairly well other than he had some hypotension on lisinopril which was discontinued. He has recently had some tachyarrhythmias and had increased his metoprolol succinate to 25 mg daily due to tachyarrhythmias. He is enrolled in a home monitoring service with the [facility] patient care network. He was successfully transitioned over to Eliquis she is taking without any bleeding difficulty. He denies any shortness of breath or worsening chest pain. He is due for a remote pacer check in a few days.  Cody George returns for follow-up today. He is feeling well denies any chest pain or worsening shortness of breath. He has no palpitations. Blood pressure is well controlled. He had recent bilateral cataract surgery and is seeing much better. There are no bleeding issues.  I saw Cody George back in the office today for follow-up. Tells me a few months ago he had pneumonia and has been very slow to recover. Chest x-ray shows improvement however there are changes consistent with COPD. He has a greater than 65 year history of smoking. I do not believe he carries a diagnosis of COPD and is currently not on any medications for this. He reports some worsening shortness of breath as well has fatigue. He denies any particular chest pain. He does have valvular heart disease and history of aortic stenosis. Recent echo in March shows some worsening of valvular function with now moderate aortic stenosis.  Cody George returns today for follow-up. Overall he is without complaints. He scheduled to see Cody George in the office next week for follow-up of his pacemaker. He is  atrially paced today. He had a low burden of atrial fibrillation but remains on Eliquis. He is asymptomatic with this. He's had no bleeding problems on Eliquis. Blood pressure is high normal today 152/82. He says that generally is between 893 and 810 systolic at home. I reminded him that his prior echocardiogram showed what may be moderate aortic stenosis. Interestingly, I have not been able to auscultate that on exam. His prior echo was almost a year ago and will be due for repeat echo this year.  11/04/2015  Cody George was seen today in the office. He reports that over the past several months she's had a significant decline. He has had worsening energy and fatigue as well as shortness of breath. He can only walk short distances before becoming significantly short of breath. He is also developed a mildly productive cough with a tannish sputum. He is concerned about whether he might have had recurrent pneumonia. He does have a history of COPD and pneumonia in the past. An echocardiogram was performed and March 2017 which showed mild to moderate aortic stenosis by valve gradient however visually the aortic valve appeared to be moderate to severely stenosed. On exam is very difficult with his COPD and hyperexpansion to auscultate his aortic murmur and I cannot reliably hear the second heart sound. Cody George appeared to be short of breath today and was unable to finish sentences at times. He denies any fevers or chills at home. He has had some mild recent weight loss. He also complains of lower extremity swelling, which  is worse on the left leg than the right. There is some orthopnea.  11/16/2015  Cody George returns today for follow-up. He reports feeling much better. His weight has come down several pounds and he notes marked improvement in his edema. His shortness of breath is also improved and he feels much more energized. Unfortunately his echocardiogram shows a newly reduced LVEF of 20-25% with global  hypokinesis. 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 His chest x-ray showed no evidence of pneumonia.  01/06/2016  Cody George was seen today in follow-up. He underwent cardioversion after loading with amiodarone which required 3 shocks but ultimately he converted back to sinus rhythm. Today he is in an atrial paced rhythm but appears to be maintaining sinus. Unfortunately he feels "horrible". It's not clear why he feels worse today but has felt better in the past. He says some days are good and some days are bad. He also became very tearful in the office today. He is still quite upset about the death of a family member last year. I'm concerned that he is struggling from depression. He does not appear volume overloaded on exam. Now that he is back in sinus rhythm, his cardiac output should be better but he still reports fatigue, poor sleep at night, lack of interest in things, tearfulness and other symptoms which may be more likely to be depression.  04/13/2016  Cody George returns today for follow-up. He is doing markedly better. His color is improved significantly and so is his attitude. He has generally maintained a sinus rhythm since cardioversion on 12/07/2015. He was on amiodarone but had significant side effects with this. The medication was then wean down and discontinued. His overall burden of atrial fibrillation was about 27%. In general it seems like his A. fib may be playing a role in his fatigue. He recently has been describing some left upper extremity swelling. He saw Cody George, who felt that this could be occlusion of the left subclavian vein related to the pacemaker leads with possibly super impose thrombus. Today he reports is been no significant change in his swelling despite being on Eliquis for more than 3 months. He has been trying to keep his arm  elevated.  10/11/2016  Cody George returns today for follow-up. He recently saw Cody George for interrogation of his pacemaker. He had an echo in February which showed improvement in EF from 20-25% up to 40%. He says he feels great and in fact if he didn't have ongoing back pain he be in very good shape. He's had resolution of his upper extremity swelling. He does get some right lower extremity swelling. He has hemosiderin deposition of the right lower extremity and some superficial varicosities suggesting poor venous return. He notes marked improvement when elevating his foot on aggressive suspect this is venous insufficiency. He may benefit from wearing compression stocking. He denies any further syncope. He has no chest pain or worsening shortness of breath.  12/22/2016  Cody George returns today for hospital follow-up.  He was recently hospitalized in any pain in [place] for A. fib with RVR and acute congestive heart failure.  He required IV diuretics and medication adjustment for rate control.  He lost about 10 pounds with diuresis and says his breathing is better however he feels very fatigued and has almost no energy.  On discharge, surprisingly he was told to increase his Toprol-XL from 38  mg daily to 300 mg or 3 tablets twice daily.  Passive dose change and I suspect may be a medication error.  He said he took this for a couple days and felt terrible and then went back to taking Toprol-XL 50 mg daily.  On the day prior to discharge his schedule his meds indicated he was taking metoprolol succinate 150 mg twice daily, so it seems that he was on this increased dose.  Dr. Domenic Polite had recommended increasing the dose to 100 in the morning and 50 at night.  He was also started on digoxin.  Heart rate and blood pressure appear normal today.  02/13/2017  Cody George returns today for follow-up.  He continues to feel fatigued.  He says his energy level is not good.  He is in more persistent atrial  fibrillation and recently his pacemaker was changed to VVIR.  I did lab work which indicates no evidence of anemia however BNP is elevated.  Creatinine is stable.  His LVEF has declined to 30%.  He recently had an elevated digoxin level and I decreased his digoxin intake.  His repeat level was 0.7.  03/17/2017  Cody George returns today for follow-up.  He actually looks great today.  He is much more alert and awake.  He is more in a joking mood.  He has had about 11 pound weight gain since I saw him however he had lost a lot of weight and still is within normal limits.  He says he feels much better and denies any shortness of breath.  There is no evidence of any edema.  I suspect that his appetite is improved and he reported that is the case.  He has been taking the Entresto now for over a month and feels like it is helping him.  We discussed the importance of trying to increase the dose as tolerated.  He has at least another month of samples which time we will hopefully step him up to the 49/51 mg dose.  He had a recent remote patient check which was normal.  06/14/2017  Cody George is seen today in follow-up.  He seems to be in a really good mood today and is accompanied by another 1 of his daughters.  He denies any chest pain or shortness of breath however his daughter noted that he has been complaining of shortness of breath at home.  Interestingly she says that he is not been compliant with his inhaler for COPD.  That certainly could explain things.  He seems to be taking the Entresto, though blood pressure is well controlled today.  Weight has been stable and is within a pound of his weight 3 months ago.  He denies any bleeding problems on Eliquis.  Recent remote check of his device shows normal function.  He is in permanent atrial fibrillation.  12/27/2017  Cody George returns today for follow-up.  In general he is feeling much better.  In fact to me looks remarkably better compared to when I saw him  about a year and 1/2 to 2 years ago.  At that time he was very depressed he had low energy and was significantly fatigued.  EF had been as low as 30% however came up to 40% last year.  His most recent echo performed about 1 month ago showed his EF up to 50 to 55%.  He does have moderate aortic insufficiency and mild aortic stenosis which will need to follow.  Overall his energy level is good  and he endorses and most NYHA class II symptoms.  He is on moderate dose Entresto and blood pressure will not likely allow increased to the high dose.  He also remains on digoxin and Toprol-XL 25 mg daily (this dose was recently decreased by Cody George to allow less frequent pacing).  His most recent pacemaker interrogation indicated 80% V pacing.  10/03/2018  Cody George is seen today in follow-up.  Overall he says he feels well.  Unfortunately lost his wife in June.  I been married for over 32 years.  Recently his echo in 2019 showed improvement in LVEF up to 50 to 55%.  He still has generally NYHA class II heart failure symptoms.  Recently he has had some weight gain.  In fact he is up about 6 pounds.  On exam today he is noted to be edematous.  He says he is not been taking his Lasix routinely.  He had a remote check recently which showed permanent atrial fibrillation.  He is scheduled for an in office visit with Cody George on Monday.  04/17/2019  Cody George is seen today in follow-up.  Over the past couple weeks he has noted some worsening shortness of breath.  He is also had mildly productive cough and some wheezing.  They are concerned about heart failure although his weight is 6 pounds lower than it had been previously.  He has some trace sock line edema.  He is not been using his Lasix on a daily basis rather as needed.  He is on the [place].  LVEF had come up to 50 to 55% previously.  He does have a history of some COPD and was on a Trelegy inhaler, but has been noncompliant with that.  07/24/2019  Cody George returns  today for follow-up.  He seems to be getting along well but is struggling with lower extremity edema.  He reports variable compliance with his Lasix.  He does take Entresto and more recently has been more compliant with the Trelegy inhaler.  He has some shortness of breath and mucus production which are more consistent with COPD.  Blood pressure was a little bit elevated today.  He denies any chest pain.  PMHx:  Past Medical History:  Diagnosis Date  . Acute systolic congestive heart failure ([place]) 12/07/2015  . Arthritis   . Cellulitis   . Community acquired pneumonia 04/14/2014  . COPD mixed type ([place]) 12/07/2016  . Deep vein thrombosis (DVT) of left upper extremity ([place]) 04/13/2016  . Depression 01/06/2016  . Dysrhythmia    PAF  . GERD (gastroesophageal reflux disease)   . History of nuclear stress test 10/05/2010   dipyridamole; normal pattern of perfusion in all regions; no significant ishcemia, low risk scan   . Hypertension   . OA (osteoarthritis)   . Pacemaker 07/22/2011   St. 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 Paroxysmal atrial fibrillation (H[facility])   . Prostate cancer ([place])    radiation  . Shortness of breath     Past Surgical History:  Procedure Laterality Date  . CARDIOVERSION N/A 12/07/2015   Procedure: CARDIOVERSION;  Surgeon: Pixie Casino, MD;  Location[facility] ENDOSCOPY;  Service: Cardiovascular;  Laterality: N/A;  . HERNIA REPAIR Bilateral   . INGUINAL HERNIA REPAIR Left 06/10/2015   Procedure: HERNIA REPAIR INGUINAL ADULT WITH MESH;  Surgeon: Aviva Signs, MD;  Location: [facility] ORS;  Service: General;  Laterality: Left;  Pt notified to arrive at  6:15am KF  . NM MYOCAR PERF WALL MOTION  10/05/10   normal  . PACEMAKER INSERTION  07/22/2011   St. 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 PERMANENT PACEMAKER INSERTION N/A 07/22/2011   Procedure: PERMANENT PACEMAKER INSERTION;  Surgeon: Sanda Klein, MD;  Location: [place] CATH LAB;  Service: Cardiovascular;  Laterality: N/A;  . PILONIDAL CYST DRAINAGE     x2  . TEE WITHOUT CARDIOVERSION N/A 12/07/2015   Procedure: TRANSESOPHAGEAL ECHOCARDIOGRAM (TEE);  Surgeon: Pixie Casino, MD;  Location: [facility] ENDOSCOPY;  Service: Cardiovascular;  Laterality: N/A;  . TRANSTHORACIC ECHOCARDIOGRAM  10/05/2010   EF=50-55%, normal LV systolic function; LA & RA mildly dilated; mild MR; mild TR with RV systolic pressure elevted at 30-51mmHg; AV mildly sclerotic with mild calcif of AV leaflets, mild valvular AS, mild-mod regurg; trace pulm valve regurg  . US ECHOCARDIOGRAPHY  10/05/10   LA mildly dilated, mild TR, mild ca+ of AOV ,mild to mod. AI    FAMHx:  Family History  Problem Relation Age of Onset  . Sudden Cardiac Death Neg Hx     SOCHx:   reports that he has been smoking. He has a 144.00 pack-year smoking history. He has never used smokeless tobacco. He reports that he does not drink alcohol or use drugs.  ALLERGIES:  No Known Allergies  ROS: Pertinent items noted in HPI and remainder of comprehensive ROS otherwise negative.  HOME MEDS: Current Outpatient Medications  Medication Sig Dispense Refill  . digoxin (LANOXIN) 0.125 MG tablet TAKE ONE TABLET (0.125MG  TOTAL) BY MOUTHDAILY 90 tablet 1  . ELIQUIS 5 MG TABS tablet TAKE 1 TABLET (5 MG TOTAL) BY MOUTH 2 (TWO) TIMES DAILY 180 tablet 3  . Fluticasone-Umeclidin-Vilant (TRELEGY ELLIPTA) 100-62.5-25 MCG/INH AEPB Inhale 1 puff into the lungs daily. 30 each 0  . furosemide (LASIX) 40 MG tablet Take 40 mg by mouth. Take 3 times weekly    . guaiFENesin (MUCINEX) 600 MG 12 hr tablet Take 1 tablet (600 mg total) by mouth 2 (two) times daily. 60 tablet 0  . metoprolol succinate (TOPROL-XL) 25 MG 24 hr tablet Take 1 tablet (25 mg total) by mouth daily. Take with or immediately following a meal. 90 tablet 3  . mirtazapine (REMERON) 30 MG tablet Take 30 mg by mouth at bedtime.     .  sacubitril-valsartan (ENTRESTO) 49-51 MG Take 1 tablet by mouth 2 (two) times daily. 60 tablet 5  . tamsulosin (FLOMAX) 0.4 MG CAPS capsule Take 0.4 mg by mouth every evening.     . vitamin B-12 (CYANOCOBALAMIN) 1000 MCG tablet Take 1,000 mcg by mouth daily.    Marland Kitchen VITAMIN C, CALCIUM ASCORBATE, PO Take 1,000 mg by mouth daily.     Marland Kitchen VITAMIN D, CHOLECALCIFEROL, PO Take 1,000 mg by mouth daily.      No current facility-administered medications for this visit.    LABS/IMAGING: No results found for this or any previous visit (from the past 48 hour(s)). No results found.  VITALS: BP (!) 144/70   Pulse (!) 101   Ht 5\' 8"  (1.727 m)   Wt 161 lb 6.4 oz (73.2 kg)   BMI 24.54 kg/m   EXAM: General appearance: alert and no distress Neck: no carotid bruit, no JVD and thyroid not enlarged, symmetric, no tenderness/mass/nodules Lungs: diminished breath sounds bilaterally, rhonchi bilaterally and wheezes LUL and RUL Heart: irregularly irregular rhythm, S1, S2 normal, systolic murmur: systolic ejection 2/6, crescendo at 2nd right  intercostal space and possibly a diastolic mumur Abdomen: soft, non-tender; bowel sounds normal; no masses,  no organomegaly Extremities: edema trace left ankle edema and venous stasis dermatitis noted Pulses: 2+ and symmetric Skin: Skin color, texture, turgor normal. No rashes or lesions Neurologic: Grossly normal Psych: Pleasant  EKG: A. fib with RVR at 101, anterolateral ST and T wave changes-personally reviewed  ASSESSMENT: 1. Chronic COPD 2. Acute on chronic systolic congestive heart failure, LVEF 20-25% (improved to 50-55% - 11/2017), NYHA class II heart failure symptoms 3. Status post St. Jude Accent DR pacemaker for sinus arrest - VVIR 4. Labile hypertension - controlled 5. Chronic anticoagulation on Eliquis 6. COPD -noncompliant with inhalers 7. Aortic stenosis - mild to moderate (03/2016), with moderate AI  PLAN: 1.   Mr. Lyford continues to have  persistent lower extremity edema but is not as compliant with his diuretic due to being in the bathroom all day.  He will need to take it more at least a few times a week if not every day to note an improvement in his edema.  I think this also hinders his activities.  It would seem that his heart failure is primarily diastolic.  He reports no issues with anticoagulation on Eliquis other than bleeding with falls or bruising.   Follow-up in 6 months or sooner as necessary.  Pixie Casino, MD, [facility], [place] Director of the Advanced [facility]  Cardiovascular [facility] Reduction [facility] Attending Cardiologist  Direct Dial: [phone]  Fax: [phone]  Website:  [email or web address]  Nadean Corwin Becky Berberian 07/24/2019, 10:29 AM

## 2019-07-24 NOTE — Patient Instructions (Signed)
Medication Instructions:  TAKE lasix daily Continue all other current medications  *If you need a refill on your cardiac medications before your next appointment, please call your pharmacy*   Lab Work: NONE If you have labs (blood work) drawn today and your tests are completely normal, you will receive your results only by:  [place] (if you have MyChart) OR  A paper copy in the mail If you have any lab test that is abnormal or we need to change your treatment, we will call you to review the results.   Testing/Procedures: NONE   Follow-Up: At [facility], you and your health needs are our priority.  As part of our continuing mission to provide you with exceptional heart care, we have created designated Provider Care Teams.  These Care Teams include your primary Cardiologist (physician) and Advanced Practice Providers (APPs -  Physician Assistants and Nurse Practitioners) who all work together to provide you with the care you need, when you need it.  We recommend signing up for the patient portal called "MyChart".  Sign up information is provided on this After Visit Summary.  MyChart is used to connect with patients for Virtual Visits (Telemedicine).  Patients are able to view lab/test results, encounter notes, upcoming appointments, etc.  Non-urgent messages can be sent to your provider as well.   To learn more about what you can do with MyChart, go to [email or web address].    Your next appointment:   6 month(s)  The format for your next appointment:   In Person  Provider:   You may see Pixie Casino, MD or one of the following Advanced Practice Providers on your designated Care Team:    Almyra Deforest, PA-C  Fabian Sharp, PA-C or   Roby Lofts, [place]    Other Instructions

## 2019-07-31 DIAGNOSIS — I482 Chronic atrial fibrillation, unspecified: Secondary | ICD-10-CM | POA: Diagnosis not present

## 2019-07-31 DIAGNOSIS — I509 Heart failure, unspecified: Secondary | ICD-10-CM | POA: Diagnosis not present

## 2019-07-31 DIAGNOSIS — J449 Chronic obstructive pulmonary disease, unspecified: Secondary | ICD-10-CM | POA: Diagnosis not present

## 2019-07-31 DIAGNOSIS — J441 Chronic obstructive pulmonary disease with (acute) exacerbation: Secondary | ICD-10-CM | POA: Diagnosis not present

## 2019-08-08 DIAGNOSIS — F17200 Nicotine dependence, unspecified, uncomplicated: Secondary | ICD-10-CM | POA: Diagnosis not present

## 2019-08-08 DIAGNOSIS — D519 Vitamin B12 deficiency anemia, unspecified: Secondary | ICD-10-CM | POA: Diagnosis not present

## 2019-08-08 DIAGNOSIS — I10 Essential (primary) hypertension: Secondary | ICD-10-CM | POA: Diagnosis not present

## 2019-08-08 DIAGNOSIS — E43 Unspecified severe protein-calorie malnutrition: Secondary | ICD-10-CM | POA: Diagnosis not present

## 2019-08-08 DIAGNOSIS — E46 Unspecified protein-calorie malnutrition: Secondary | ICD-10-CM | POA: Diagnosis not present

## 2019-08-08 DIAGNOSIS — E782 Mixed hyperlipidemia: Secondary | ICD-10-CM | POA: Diagnosis not present

## 2019-08-08 DIAGNOSIS — E441 Mild protein-calorie malnutrition: Secondary | ICD-10-CM | POA: Diagnosis not present

## 2019-08-13 DIAGNOSIS — J449 Chronic obstructive pulmonary disease, unspecified: Secondary | ICD-10-CM | POA: Diagnosis not present

## 2019-08-13 DIAGNOSIS — J9612 Chronic respiratory failure with hypercapnia: Secondary | ICD-10-CM | POA: Diagnosis not present

## 2019-08-13 DIAGNOSIS — D519 Vitamin B12 deficiency anemia, unspecified: Secondary | ICD-10-CM | POA: Diagnosis not present

## 2019-08-13 DIAGNOSIS — Z0001 Encounter for general adult medical examination with abnormal findings: Secondary | ICD-10-CM | POA: Diagnosis not present

## 2019-08-13 DIAGNOSIS — Z0000 Encounter for general adult medical examination without abnormal findings: Secondary | ICD-10-CM | POA: Diagnosis not present

## 2019-08-16 DIAGNOSIS — J441 Chronic obstructive pulmonary disease with (acute) exacerbation: Secondary | ICD-10-CM | POA: Diagnosis not present

## 2019-08-16 DIAGNOSIS — D519 Vitamin B12 deficiency anemia, unspecified: Secondary | ICD-10-CM | POA: Diagnosis not present

## 2019-08-16 DIAGNOSIS — I482 Chronic atrial fibrillation, unspecified: Secondary | ICD-10-CM | POA: Diagnosis not present

## 2019-08-16 DIAGNOSIS — J449 Chronic obstructive pulmonary disease, unspecified: Secondary | ICD-10-CM | POA: Diagnosis not present

## 2019-08-16 DIAGNOSIS — I509 Heart failure, unspecified: Secondary | ICD-10-CM | POA: Diagnosis not present

## 2019-08-22 DIAGNOSIS — J449 Chronic obstructive pulmonary disease, unspecified: Secondary | ICD-10-CM | POA: Diagnosis not present

## 2019-09-10 ENCOUNTER — Ambulatory Visit (INDEPENDENT_AMBULATORY_CARE_PROVIDER_SITE_OTHER): Payer: Medicare Other | Admitting: *Deleted

## 2019-09-10 DIAGNOSIS — I495 Sick sinus syndrome: Secondary | ICD-10-CM | POA: Diagnosis not present

## 2019-09-10 LAB — CUP PACEART REMOTE DEVICE CHECK
Battery Remaining Longevity: 50 mo
Battery Remaining Percentage: 46 %
Battery Voltage: 2.86 V
Brady Statistic RV Percent Paced: 76 %
Date Time Interrogation Session: 20210727031219
Implantable Lead Implant Date: 20130607
Implantable Lead Implant Date: 20130607
Implantable Lead Location: 753859
Implantable Lead Location: 753860
Implantable Pulse Generator Implant Date: 20130607
Lead Channel Impedance Value: 410 Ohm
Lead Channel Pacing Threshold Amplitude: 1 V
Lead Channel Pacing Threshold Pulse Width: 0.5 ms
Lead Channel Sensing Intrinsic Amplitude: 12 mV
Lead Channel Setting Pacing Amplitude: 2.5 V
Lead Channel Setting Pacing Pulse Width: 0.5 ms
Lead Channel Setting Sensing Sensitivity: 2 mV
Pulse Gen Model: 2210
Pulse Gen Serial Number: 7353349

## 2019-09-13 NOTE — Progress Notes (Signed)
Remote pacemaker transmission.   

## 2019-09-26 DIAGNOSIS — I482 Chronic atrial fibrillation, unspecified: Secondary | ICD-10-CM | POA: Diagnosis not present

## 2019-09-26 DIAGNOSIS — J441 Chronic obstructive pulmonary disease with (acute) exacerbation: Secondary | ICD-10-CM | POA: Diagnosis not present

## 2019-09-26 DIAGNOSIS — D519 Vitamin B12 deficiency anemia, unspecified: Secondary | ICD-10-CM | POA: Diagnosis not present

## 2019-09-26 DIAGNOSIS — J449 Chronic obstructive pulmonary disease, unspecified: Secondary | ICD-10-CM | POA: Diagnosis not present

## 2019-09-26 DIAGNOSIS — I509 Heart failure, unspecified: Secondary | ICD-10-CM | POA: Diagnosis not present

## 2019-10-14 ENCOUNTER — Encounter: Payer: Self-pay | Admitting: Cardiovascular Disease

## 2019-10-14 ENCOUNTER — Ambulatory Visit (INDEPENDENT_AMBULATORY_CARE_PROVIDER_SITE_OTHER): Payer: Medicare Other | Admitting: Cardiovascular Disease

## 2019-10-14 ENCOUNTER — Other Ambulatory Visit: Payer: Self-pay

## 2019-10-14 VITALS — BP 111/59 | HR 74 | Ht 70.0 in | Wt 159.0 lb

## 2019-10-14 DIAGNOSIS — I4819 Other persistent atrial fibrillation: Secondary | ICD-10-CM | POA: Diagnosis not present

## 2019-10-14 DIAGNOSIS — I350 Nonrheumatic aortic (valve) stenosis: Secondary | ICD-10-CM

## 2019-10-14 DIAGNOSIS — Z7901 Long term (current) use of anticoagulants: Secondary | ICD-10-CM

## 2019-10-14 DIAGNOSIS — I5042 Chronic combined systolic (congestive) and diastolic (congestive) heart failure: Secondary | ICD-10-CM

## 2019-10-14 DIAGNOSIS — Z950 Presence of cardiac pacemaker: Secondary | ICD-10-CM

## 2019-10-14 MED ORDER — POTASSIUM CHLORIDE CRYS ER 10 MEQ PO TBCR: 10.0000 meq | EXTENDED_RELEASE_TABLET | Freq: Every day | ORAL | 11 refills | Status: AC

## 2019-10-14 MED ORDER — FUROSEMIDE 40 MG PO TABS: 40.0000 mg | ORAL_TABLET | Freq: Every day | ORAL | 11 refills | Status: AC

## 2019-10-14 NOTE — Patient Instructions (Signed)
Medication Instructions:  START Potassium 10 mEq once daily INCREASE the Furosemide to 40 mg once daily  *If you need a refill on your cardiac medications before your next appointment, please call your pharmacy*   Lab Work: Your provider would like for you to return in 2-3 weeks to have the following labs drawn: BMET and Digoxin level. You do not need an appointment for the lab. Once in our office lobby there is a podium where you can sign in and ring the doorbell to alert [place] that you are here. The lab is open from 8:00 am to 4:30 pm; closed for lunch from 12:45pm-1:45pm.  If you have labs (blood work) drawn today and your tests are completely normal, you will receive your results only by: [place] MyChart Message (if you have MyChart) OR . A paper copy in the mail If you have any lab test that is abnormal or we need to change your treatment, we will call you to review the results.   Testing/Procedures: None ordered   Follow-Up: At [facility], you and your health needs are our priority.  As part of our continuing mission to provide you with exceptional heart care, we have created designated Provider Care Teams.  These Care Teams include your primary Cardiologist (physician) and Advanced Practice Providers (APPs -  Physician Assistants and Nurse Practitioners) who all work together to provide you with the care you need, when you need it.  We recommend signing up for the patient portal called "MyChart".  Sign up information is provided on this After Visit Summary.  MyChart is used to connect with patients for Virtual Visits (Telemedicine).  Patients are able to view lab/test results, encounter notes, upcoming appointments, etc.  Non-urgent messages can be sent to your provider as well.   To learn more about what you can do with MyChart, go to [email or web address].    Your next appointment:   Follow up with an APP in 2-3 weeks in office Follow up in 3 months with Dr. Sallyanne Kuster on a pacer  day

## 2019-10-14 NOTE — Progress Notes (Addendum)
Patient ID: Cody George, male   DOB: Dec 12, 1930, 86 y.o.   MRN: [id number]    Cardiology Office Note    Date:  10/14/2019   ID:  Cody George, DOB Feb 23, 1930, MRN [id number]  PCP:  Celene Squibb, MD  Cardiologist:  Raliegh Ip. [name] HILTY, MD; Sanda Klein, MD   Chief Complaint  Patient presents with  . Congestive Heart Failure  . Atrial Fibrillation    History of Present Illness:  Cody George is a 86 y.o. male with a history of paroxysmal atrial fibrillation and sinus node arrest who received a dual-chamber pacemaker in 2013.  He has subsequently progressed to permanent atrial fibrillation.  He has had problems with exertional dyspnea over the last few weeks.  This does not happen every day, but simply walking down the hall in his home may sometimes make him feel exhausted.  He does not have orthopnea or PND, but he does have edema on exam today (although he did not really acknowledge it himself). He has not had dizziness, syncope or palpitations.  He has not had any falls or serious injuries or bleeding problems.  He does not hear any wheezing when he is short winded.  He only takes the furosemide every other day, due to increased urination.  Compliance with sodium restriction is not always good. He loves hot dogs.  His device was implanted in 2013 ([place]) and still has approximately 3.7 years of estimated longevity.  Similar to past trends, he has 75% ventricular pacing.  Pacing threshold was actually checked today and was within normal limits and unchanged from previous downloads.  He has not had any episodes of high ventricular rates.  Echo performed in October 2018 showed moderately to severely depressed left ventricular systolic function with ejection fraction of 30% with diffuse hypokinesis and mild to moderate LVH. There is mild to moderate aortic stenosis (mean gradient 11 mmHg, valve area 1.2 cm). The left atrium was severely dilated 50 mm.   He had isolated mild  swelling in his left upper extremity and showed some evidence of collateral vein circulation on the anterior chest and shoulder, consistent with subclavian vein occlusion, but this has resolved completely. He no longer has any swelling and the collateral veins have shrunk.  Past Medical History:  Diagnosis Date  . Acute systolic congestive heart failure ([place]) 12/07/2015  . Arthritis   . Cellulitis   . Community acquired pneumonia 04/14/2014  . COPD mixed type ([place]) 12/07/2016  . Deep vein thrombosis (DVT) of left upper extremity ([place]) 04/13/2016  . Depression 01/06/2016  . Dysrhythmia    PAF  . GERD (gastroesophageal reflux disease)   . History of nuclear stress test 10/05/2010   dipyridamole; normal pattern of perfusion in all regions; no significant ishcemia, low risk scan   . Hypertension   . OA (osteoarthritis)   . Pacemaker 07/22/2011   St. 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 Paroxysmal atrial fibrillation (HCC)   . Prostate cancer ([place])    radiation  . Shortness of breath     Past Surgical History:  Procedure Laterality Date  . CARDIOVERSION N/A 12/07/2015   Procedure: CARDIOVERSION;  Surgeon: Pixie Casino, MD;  Location: [facility] ENDOSCOPY;  Service: Cardiovascular;  Laterality: N/A;  . HERNIA REPAIR Bilateral   . INGUINAL HERNIA REPAIR Left 06/10/2015   Procedure: HERNIA REPAIR INGUINAL ADULT WITH MESH;  Surgeon: Aviva Signs, MD;  Location: [facility] ORS;  Service: General;  Laterality: Left;  Pt notified to arrive at 6:15am KF  . NM MYOCAR PERF WALL MOTION  10/05/10   normal  . PACEMAKER INSERTION  07/22/2011   St. 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 PERMANENT PACEMAKER INSERTION N/A 07/22/2011   Procedure: PERMANENT PACEMAKER INSERTION;  Surgeon: Sanda Klein, MD;  Location: [facility] CATH LAB;  Service: Cardiovascular;  Laterality: N/A;  . PILONIDAL CYST DRAINAGE     x2  . TEE WITHOUT  CARDIOVERSION N/A 12/07/2015   Procedure: TRANSESOPHAGEAL ECHOCARDIOGRAM (TEE);  Surgeon: Pixie Casino, MD;  Location: [facility] ENDOSCOPY;  Service: Cardiovascular;  Laterality: N/A;  . TRANSTHORACIC ECHOCARDIOGRAM  10/05/2010   EF=50-55%, normal LV systolic function; LA & RA mildly dilated; mild MR; mild TR with RV systolic pressure elevted at 30-31mmHg; AV mildly sclerotic with mild calcif of AV leaflets, mild valvular AS, mild-mod regurg; trace pulm valve regurg  . US ECHOCARDIOGRAPHY  10/05/10   LA mildly dilated, mild TR, mild ca+ of AOV ,mild to mod. AI    Outpatient Medications Prior to Visit  Medication Sig Dispense Refill  . digoxin (LANOXIN) 0.125 MG tablet TAKE ONE TABLET (0.125MG  TOTAL) BY MOUTHDAILY 90 tablet 1  . ELIQUIS 5 MG TABS tablet TAKE 1 TABLET (5 MG TOTAL) BY MOUTH 2 (TWO) TIMES DAILY 180 tablet 3  . Fluticasone-Umeclidin-Vilant (TRELEGY ELLIPTA) 100-62.5-25 MCG/INH AEPB Inhale 1 puff into the lungs daily. 30 each 0  . guaiFENesin (MUCINEX) 600 MG 12 hr tablet Take 1 tablet (600 mg total) by mouth 2 (two) times daily. 60 tablet 0  . metoprolol succinate (TOPROL-XL) 25 MG 24 hr tablet Take 1 tablet (25 mg total) by mouth daily. Take with or immediately following a meal. 90 tablet 3  . mirtazapine (REMERON) 30 MG tablet Take 30 mg by mouth at bedtime.     . sacubitril-valsartan (ENTRESTO) 49-51 MG Take 1 tablet by mouth 2 (two) times daily. 60 tablet 5  . tamsulosin (FLOMAX) 0.4 MG CAPS capsule Take 0.4 mg by mouth every evening.     . vitamin B-12 (CYANOCOBALAMIN) 1000 MCG tablet Take 1,000 mcg by mouth daily.    Marland Kitchen VITAMIN C, CALCIUM ASCORBATE, PO Take 1,000 mg by mouth daily.     Marland Kitchen VITAMIN D, CHOLECALCIFEROL, PO Take 1,000 mg by mouth daily.     . furosemide (LASIX) 40 MG tablet Take 40 mg by mouth daily.     . sertraline (ZOLOFT) 25 MG tablet Take 25 mg by mouth daily.     No facility-administered medications prior to visit.     Allergies:   Patient has no known  allergies.   Social History   Socioeconomic History  . Marital status: Married    Spouse name: Not on file  . Number of children: 5  . Years of education: Not on file  . Highest education level: Not on file  Occupational History    Employer: RETIRED  Tobacco Use  . Smoking status: Current Every Day Smoker    Packs/day: 2.00    Years: 72.00    Pack years: 144.00  . Smokeless tobacco: Never Used  . Tobacco comment: now 1/2 ppd (07/09/14)  Vaping Use  . Vaping Use: Never used  Substance and Sexual Activity  . Alcohol use: No    Alcohol/week: 0.0 standard drinks  . Drug use: No  . Sexual activity: Not Currently  Other Topics Concern  . Not on file  Social History Narrative  .  Not on file   Social Determinants of Health   Financial Resource Strain:   . Difficulty of Paying Living Expenses: Not on file  Food Insecurity:   . Worried About Charity fundraiser in the Last Year: Not on file  . Ran Out of Food in the Last Year: Not on file  Transportation Needs:   . Lack of Transportation (Medical): Not on file  . Lack of Transportation (Non-Medical): Not on file  Physical Activity:   . Days of Exercise per Week: Not on file  . Minutes of Exercise per Session: Not on file  Stress:   . Feeling of Stress : Not on file  Social Connections:   . Frequency of Communication with Friends and Family: Not on file  . Frequency of Social Gatherings with Friends and Family: Not on file  . Attends Religious Services: Not on file  . Active Member of Clubs or Organizations: Not on file  . Attends Archivist Meetings: Not on file  . Marital Status: Not on file     ROS:   Please see the history of present illness.    All other systems are reviewed and are negative.  PHYSICAL EXAM:   VS:  BP (!) 111/59   Pulse 74   Ht 5\' 10"  (1.778 m)   Wt 159 lb (72.1 kg)   SpO2 93%   BMI 22.81 kg/m      General: Alert, oriented x3, no distress, very lean.  Healthy left subclavian  pacemaker site Head: no evidence of trauma, PERRL, EOMI, no exophtalmos or lid lag, no myxedema, no xanthelasma; normal ears, nose and oropharynx Neck: 8-10 cm elevation in jugular venous pulsations and prompt hepatojugular reflux; brisk carotid pulses without delay and no carotid bruits Chest: clear to auscultation, no signs of consolidation by percussion or palpation, normal fremitus, symmetrical and full respiratory excursions Cardiovascular: normal position and quality of the apical impulse, regular rhythm, normal first and paradoxically split second heart sounds, 1/6 aortic ejection murmur is early peaking, no diastolic murmurs, rubs or gallops Abdomen: no tenderness or distention, no masses by palpation, no abnormal pulsatility or arterial bruits, normal bowel sounds, no hepatosplenomegaly Extremities: no clubbing, cyanosis, but he has symmetrical 2+ deep pitting pretibial edema halfway to the knees bilaterally; 2+ radial, ulnar and brachial pulses bilaterally; 2+ right femoral, posterior tibial and dorsalis pedis pulses; 2+ left femoral, posterior tibial and dorsalis pedis pulses; no subclavian or femoral bruits Neurological: grossly nonfocal Psych: Normal mood and affect  Wt Readings from Last 3 Encounters:  10/14/19 159 lb (72.1 kg)  07/24/19 161 lb 6.4 oz (73.2 kg)  04/17/19 160 lb (72.6 kg)    Studies/Labs Reviewed:   EKG: The intracardiac electrogram shows atrial fibrillation with frequent ventricular paced beats Recent Labs: No results found for requested labs within last 8760 hours.  02/06/2019 hemoglobin 14.5, creatinine 0.78, normal liver function tests Lipid Panel    Component Value Date/Time   CHOL 131 12/21/2012 0820   TRIG 68 12/21/2012 0820   HDL 53 12/21/2012 0820   CHOLHDL 2.5 12/21/2012 0820   VLDL 14 12/21/2012 0820   LDLCALC 64 12/21/2012 0820  12/23 2020 total cholesterol 131, HDL 61, LDL 66, triglycerides 37    ASSESSMENT:    1. Persistent atrial  fibrillation ([place])   2. Chronic combined systolic and diastolic heart failure ([place])   3. Pacemaker   4. Long term (current) use of anticoagulants   5. Nonrheumatic aortic valve stenosis  PLAN:  In order of problems listed above:  1. AFIB: Permanent arrhythmia, well rate controlled.  Previously failed amiodarone therapy.  He has spontaneously slow ventricular rates, but is also taking digoxin and metoprolol for congestive heart failure.  He is compliant with anticoagulation. CHADSVasc 5 (age 47, CHF, CAD, HTN).   2. CHF: As in the past, when he does not take furosemide daily, heart failure signs and symptoms worsen.  Asked him again to take the furosemide every day and added a very low-dose potassium supplement.    Reviewed the importance of daily weight monitoring and sodium restriction.  Most recent LVEF was 50% by echo in October 2019.  On Entresto and metoprolol succinate.  Upgrade to CRT-P remains an option if his heart failure complaints worsen and/or EF less than 40%.  We could try increasing the Entresto to maximum dose and take advantage of its diuretic effect, and this might allow [place] to prescribe the loop diuretic on a less than once daily basis. 3. PM: Normal device function.  Similar burden of ventricular pacing has been present for a long time (upgrade to CRT-P due to frequent ventricular pacing remains an option).  Hard to decide whether the beta-blocker prescribed for heart failure is helping versus hurting by increasing ventricular pacing.  Will discuss stopping it with Dr. Debara Pickett. 4. Eliquis: No bleeding or falls. 5. AS/AI: Mild AS by physical exam and by echocardiogram performed less than 2 years ago.  AI was described as moderate but I cannot hear a murmur today.  Unlikely to be a contributor to his heart failure.   Medication Adjustments/Labs and Tests Ordered: Current medicines are reviewed at length with the patient today.  Concerns regarding medicines are outlined above.   Medication changes, Labs and Tests ordered today are listed in the Patient Instructions below. Patient Instructions  Medication Instructions:  START Potassium 10 mEq once daily INCREASE the Furosemide to 40 mg once daily  *If you need a refill on your cardiac medications before your next appointment, please call your pharmacy*   Lab Work: Your provider would like for you to return in 2-3 weeks to have the following labs drawn: BMET and Digoxin level. You do not need an appointment for the lab. Once in our office lobby there is a podium where you can sign in and ring the doorbell to alert [place] that you are here. The lab is open from 8:00 am to 4:30 pm; closed for lunch from 12:45pm-1:45pm.  If you have labs (blood work) drawn today and your tests are completely normal, you will receive your results only by: Marland Kitchen MyChart Message (if you have MyChart) OR . A paper copy in the mail If you have any lab test that is abnormal or we need to change your treatment, we will call you to review the results.   Testing/Procedures: None ordered   Follow-Up: At [facility], you and your health needs are our priority.  As part of our continuing mission to provide you with exceptional heart care, we have created designated Provider Care Teams.  These Care Teams include your primary Cardiologist (physician) and Advanced Practice Providers (APPs -  Physician Assistants and Nurse Practitioners) who all work together to provide you with the care you need, when you need it.  We recommend signing up for the patient portal called "MyChart".  Sign up information is provided on this After Visit Summary.  MyChart is used to connect with patients for Virtual Visits (Telemedicine).  Patients are  able to view lab/test results, encounter notes, upcoming appointments, etc.  Non-urgent messages can be sent to your provider as well.   To learn more about what you can do with MyChart, go to [email or web address].    Your  next appointment:   Follow up with an APP in 2-3 weeks in office Follow up in 3 months with Dr. Sallyanne Kuster on a pacer day       Signed, Sanda Klein, MD  10/14/2019 2:01 PM    [address] [place], [place],   [address] Phone: [phone]; Fax: [phone]

## 2019-10-30 DIAGNOSIS — J441 Chronic obstructive pulmonary disease with (acute) exacerbation: Secondary | ICD-10-CM | POA: Diagnosis not present

## 2019-10-30 DIAGNOSIS — I482 Chronic atrial fibrillation, unspecified: Secondary | ICD-10-CM | POA: Diagnosis not present

## 2019-10-30 DIAGNOSIS — I509 Heart failure, unspecified: Secondary | ICD-10-CM | POA: Diagnosis not present

## 2019-10-30 DIAGNOSIS — D519 Vitamin B12 deficiency anemia, unspecified: Secondary | ICD-10-CM | POA: Diagnosis not present

## 2019-10-30 DIAGNOSIS — J449 Chronic obstructive pulmonary disease, unspecified: Secondary | ICD-10-CM | POA: Diagnosis not present

## 2019-11-08 ENCOUNTER — Ambulatory Visit: Payer: Medicare Other | Admitting: Cardiology

## 2019-11-08 ENCOUNTER — Encounter: Payer: Self-pay | Admitting: Cardiology

## 2019-11-08 ENCOUNTER — Other Ambulatory Visit: Payer: Self-pay

## 2019-11-08 VITALS — BP 136/80 | HR 92 | Temp 97.0°F | Resp 20 | Ht 66.0 in | Wt 155.0 lb

## 2019-11-08 DIAGNOSIS — I350 Nonrheumatic aortic (valve) stenosis: Secondary | ICD-10-CM

## 2019-11-08 DIAGNOSIS — I4819 Other persistent atrial fibrillation: Secondary | ICD-10-CM | POA: Diagnosis not present

## 2019-11-08 DIAGNOSIS — Z7901 Long term (current) use of anticoagulants: Secondary | ICD-10-CM | POA: Diagnosis not present

## 2019-11-08 DIAGNOSIS — R5383 Other fatigue: Secondary | ICD-10-CM

## 2019-11-08 DIAGNOSIS — I495 Sick sinus syndrome: Secondary | ICD-10-CM | POA: Diagnosis not present

## 2019-11-08 DIAGNOSIS — Z950 Presence of cardiac pacemaker: Secondary | ICD-10-CM | POA: Diagnosis not present

## 2019-11-08 DIAGNOSIS — I5023 Acute on chronic systolic (congestive) heart failure: Secondary | ICD-10-CM | POA: Diagnosis not present

## 2019-11-08 NOTE — Assessment & Plan Note (Signed)
?   Secondary to low out put ? Secondary to CAF

## 2019-11-08 NOTE — Assessment & Plan Note (Signed)
Chronic AF- failed Amiodarone in the past

## 2019-11-08 NOTE — Progress Notes (Signed)
Cardiology Office Note:    Date:  11/08/2019   ID:  Cody George, DOB 05-25-1930, MRN [id number]  PCP:  Celene Squibb, MD  Cardiologist:  Pixie Casino, MD  Electrophysiologist:  None   Referring MD: Celene Squibb, MD   CC: fatigue  History of Present Illness:    Cody George is a 86 y.o. male with a hx of atrial fibrillation which appears to be chronic.  He reportedly failed amiodarone.  He is on rate control and anticoagulation now.  He did have a Environmental health practitioner pacemaker implanted in 2013 for sick sinus syndrome.  Dr. Sallyanne Kuster follows him.  Echocardiogram in October 2019 showed an ejection fraction of 50 to 55%.  He had mild AS and moderate AR.  He had severe left atrial enlargement and mild MR.  His PA pressure was 40.  He seen in the office today for follow-up.  His daughter accompanied him.  The patient says he is generally weak.  He still lives in his own home, one of his daughters lives next door.  He did tell me that his wife of 62 years past 16 months ago and it was obvious this was still weighing having on him.  He denies any orthopnea.  He is not really complaining of shortness of breath just fatigue.  He has trace lower extremity edema, he takes furosemide every other day.  Past Medical History:  Diagnosis Date  . Acute systolic congestive heart failure ([place]) 12/07/2015  . Arthritis   . Cellulitis   . Community acquired pneumonia 04/14/2014  . COPD mixed type ([place]) 12/07/2016  . Deep vein thrombosis (DVT) of left upper extremity ([place]) 04/13/2016  . Depression 01/06/2016  . Dysrhythmia    PAF  . GERD (gastroesophageal reflux disease)   . History of nuclear stress test 10/05/2010   dipyridamole; normal pattern of perfusion in all regions; no significant ishcemia, low risk scan   . Hypertension   . OA (osteoarthritis)   . Pacemaker 07/22/2011   St. 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 Paroxysmal atrial fibrillation (HCC)   .  Prostate cancer ([place])    radiation  . Shortness of breath     Past Surgical History:  Procedure Laterality Date  . CARDIOVERSION N/A 12/07/2015   Procedure: CARDIOVERSION;  Surgeon: Pixie Casino, MD;  Location: [facility] ENDOSCOPY;  Service: Cardiovascular;  Laterality: N/A;  . HERNIA REPAIR Bilateral   . INGUINAL HERNIA REPAIR Left 06/10/2015   Procedure: HERNIA REPAIR INGUINAL ADULT WITH MESH;  Surgeon: Aviva Signs, MD;  Location: [facility] ORS;  Service: General;  Laterality: Left;  Pt notified to arrive at 6:15am KF  . NM MYOCAR PERF WALL MOTION  10/05/10   normal  . PACEMAKER INSERTION  07/22/2011   St. 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 PERMANENT PACEMAKER INSERTION N/A 07/22/2011   Procedure: PERMANENT PACEMAKER INSERTION;  Surgeon: Sanda Klein, MD;  Location: [facility] CATH LAB;  Service: Cardiovascular;  Laterality: N/A;  . PILONIDAL CYST DRAINAGE     x2  . TEE WITHOUT CARDIOVERSION N/A 12/07/2015   Procedure: TRANSESOPHAGEAL ECHOCARDIOGRAM (TEE);  Surgeon: Pixie Casino, MD;  Location: [facility] ENDOSCOPY;  Service: Cardiovascular;  Laterality: N/A;  . TRANSTHORACIC ECHOCARDIOGRAM  10/05/2010   EF=50-55%, normal LV systolic function; LA & RA mildly dilated; mild MR; mild TR with RV systolic pressure elevted at 30-20mmHg; AV mildly sclerotic with mild calcif  of AV leaflets, mild valvular AS, mild-mod regurg; trace pulm valve regurg  . US ECHOCARDIOGRAPHY  10/05/10   LA mildly dilated, mild TR, mild ca+ of AOV ,mild to mod. AI    Current Medications: No outpatient medications have been marked as taking for the 11/08/19 encounter (Office Visit) with Erlene Quan, PA-C.     Allergies:   Patient has no known allergies.   Social History   Socioeconomic History  . Marital status: Married    Spouse name: Not on file  . Number of children: 5  . Years of education: Not on file  . Highest education level: Not on file  Occupational History    Employer:  RETIRED  Tobacco Use  . Smoking status: Current Every Day Smoker    Packs/day: 2.00    Years: 72.00    Pack years: 144.00  . Smokeless tobacco: Never Used  . Tobacco comment: now 1/2 ppd (07/09/14)  Vaping Use  . Vaping Use: Never used  Substance and Sexual Activity  . Alcohol use: No    Alcohol/week: 0.0 standard drinks  . Drug use: No  . Sexual activity: Not Currently  Other Topics Concern  . Not on file  Social History Narrative  . Not on file   Social Determinants of Health   Financial Resource Strain:   . Difficulty of Paying Living Expenses: Not on file  Food Insecurity:   . Worried About Charity fundraiser in the Last Year: Not on file  . Ran Out of Food in the Last Year: Not on file  Transportation Needs:   . Lack of Transportation (Medical): Not on file  . Lack of Transportation (Non-Medical): Not on file  Physical Activity:   . Days of Exercise per Week: Not on file  . Minutes of Exercise per Session: Not on file  Stress:   . Feeling of Stress : Not on file  Social Connections:   . Frequency of Communication with Friends and Family: Not on file  . Frequency of Social Gatherings with Friends and Family: Not on file  . Attends Religious Services: Not on file  . Active Member of Clubs or Organizations: Not on file  . Attends Archivist Meetings: Not on file  . Marital Status: Not on file     Family History: The patient's family history is negative for Sudden Cardiac Death.  ROS:   Please see the history of present illness.     All other systems reviewed and are negative.  EKGs/Labs/Other Studies Reviewed:    The following studies were reviewed today: Echo Oct 2019- Study Conclusions   - Left ventricle: The cavity size was normal. Wall thickness was  increased in a pattern of mild LVH. Systolic function was normal.  The estimated ejection fraction was in the range of 50% to 55%.  Wall motion was normal; there were no regional wall  motion  abnormalities. The study is not technically sufficient to allow  evaluation of LV diastolic function.  - Aortic valve: Severely calcified annulus. Trileaflet; severely  thickened leaflets. There was mild stenosis. There was moderate  regurgitation. Mean gradient (S): 11 mm Hg. Valve area (VTI):  1.82 cm^2. Valve area (Vmax): 1.73 cm^2. Valve area (Vmean): 1.77  cm^2.  - Mitral valve: Moderately to severely calcified annulus.  Moderately thickened leaflets . There was mild regurgitation.  - Left atrium: The atrium was severely dilated.  - Right ventricle: The cavity size was mildly dilated.  - Right  atrium: The atrium was mildly dilated.  - Atrial septum: No defect or patent foramen ovale was identified.  - Tricuspid valve: There was mild-moderate regurgitation.  - Pulmonary arteries: Systolic pressure was mildly to moderately  increased. PA peak pressure: 40 mm Hg (S).   EKG:  EKG is not ordered today.  The ekg ordered 07/24/2019 demonstrates AF- POD  Recent Labs: No results found for requested labs within last 8760 hours.  Recent Lipid Panel    Component Value Date/Time   CHOL 131 12/21/2012 0820   TRIG 68 12/21/2012 0820   HDL 53 12/21/2012 0820   CHOLHDL 2.5 12/21/2012 0820   VLDL 14 12/21/2012 0820   LDLCALC 64 12/21/2012 0820    Physical Exam:    VS:  There were no vitals taken for this visit.    Wt Readings from Last 3 Encounters:  10/14/19 159 lb (72.1 kg)  07/24/19 161 lb 6.4 oz (73.2 kg)  04/17/19 160 lb (72.6 kg)     GEN: Elderly male, thin, well developed in no acute distress HEENT: Normal NECK: No JVD; No carotid bruits CARDIAC: irregularly irregular, soft systolic murmur AOV, rubs, gallops RESPIRATORY:  Clear to auscultation without rales, wheezing or rhonchi  ABDOMEN: Soft, non-tender, non-distended MUSCULOSKELETAL:  No edema; No deformity  SKIN: Warm and dry NEUROLOGIC:  Alert and oriented x 3 PSYCHIATRIC:  Normal affect    ASSESSMENT:    Persistent atrial fibrillation (HCC) Chronic AF- failed Amiodarone in the past  Pacemaker -  St Jude Accent DR RF June 2013, sinus node arrest due to bradycardia  Chronic anticoagulation On Eliquis CHADS2 VASc=5  Aortic stenosis Mild with moderate AR by echo Oct 2019  Fatigue ? Secondary to low out put ? Secondary to CAF  PLAN:    Check BMP, CBC, TSH, and echo.  If his EF is not significantly depressed I'm not sure going up on his Delene Loll will help.  There could be a component of depression as well with the loss of his wife last year.    Medication Adjustments/Labs and Tests Ordered: Current medicines are reviewed at length with the patient today.  Concerns regarding medicines are outlined above.  No orders of the defined types were placed in this encounter.  No orders of the defined types were placed in this encounter.   There are no Patient Instructions on file for this visit.   Angelena Form, PA-C  11/08/2019 11:57 AM    [place]

## 2019-11-08 NOTE — Assessment & Plan Note (Signed)
On Eliquis CHADS2 VASc=5

## 2019-11-08 NOTE — Assessment & Plan Note (Signed)
Mild with moderate AR by echo Oct 2019

## 2019-11-08 NOTE — Assessment & Plan Note (Signed)
St Jude Accent DR RF June 2013, sinus node arrest due to bradycardia

## 2019-11-08 NOTE — Patient Instructions (Signed)
Medication Instructions:  Your physician recommends that you continue on your current medications as directed. Please refer to the Current Medication list given to you today.  *If you need a refill on your cardiac medications before your next appointment, please call your pharmacy*   Lab Work: Your physician recommends that you return for lab work today: add on a CBC and TSH (already has order for BMET)  If you have labs (blood work) drawn today and your tests are completely normal, you will receive your results only by: Marland Kitchen MyChart Message (if you have MyChart) OR . A paper copy in the mail If you have any lab test that is abnormal or we need to change your treatment, we will call you to review the results.   Testing/Procedures: Your physician has requested that you have an echocardiogram. Echocardiography is a painless test that uses sound waves to create images of your heart. It provides your doctor with information about the size and shape of your heart and how well your heart's chambers and valves are working. This procedure takes approximately one hour. There are no restrictions for this procedure.   Follow-Up: At [facility], you and your health needs are our priority.  As part of our continuing mission to provide you with exceptional heart care, we have created designated Provider Care Teams.  These Care Teams include your primary Cardiologist (physician) and Advanced Practice Providers (APPs -  Physician Assistants and Nurse Practitioners) who all work together to provide you with the care you need, when you need it.  We recommend signing up for the patient portal called "MyChart".  Sign up information is provided on this After Visit Summary.  MyChart is used to connect with patients for Virtual Visits (Telemedicine).  Patients are able to view lab/test results, encounter notes, upcoming appointments, etc.  Non-urgent messages can be sent to your provider as well.   To learn more  about what you can do with MyChart, go to [email or web address].    Your next appointment:   Please keep your scheduled follow-up appointment in November with Dr. Sallyanne Kuster.

## 2019-11-09 LAB — BASIC METABOLIC PANEL
BUN/Creatinine Ratio: 28 — ABNORMAL HIGH (ref 10–24)
BUN: 21 mg/dL (ref 8–27)
CO2: 27 mmol/L (ref 20–29)
Calcium: 9.4 mg/dL (ref 8.6–10.2)
Chloride: 96 mmol/L (ref 96–106)
Creatinine, Ser: 0.76 mg/dL (ref 0.76–1.27)
GFR calc Af Amer: 95 mL/min/{1.73_m2} (ref >59)
GFR calc non Af Amer: 82 mL/min/{1.73_m2} (ref >59)
Glucose: 76 mg/dL (ref 65–99)
Potassium: 4.9 mmol/L (ref 3.5–5.2)
Sodium: 136 mmol/L (ref 134–144)

## 2019-11-09 LAB — DIGOXIN LEVEL: Digoxin, Serum: 0.7 ng/mL (ref 0.5–0.9)

## 2019-11-12 ENCOUNTER — Encounter: Payer: Self-pay | Admitting: *Deleted

## 2019-11-14 ENCOUNTER — Ambulatory Visit (HOSPITAL_COMMUNITY)
Admission: RE | Admit: 2019-11-14 | Discharge: 2019-11-14 | Disposition: A | Discharge status: Home / Self Care | Payer: Medicare Other | Source: Ambulatory Visit | Attending: Cardiology | Admitting: Cardiology

## 2019-11-14 ENCOUNTER — Other Ambulatory Visit: Payer: Self-pay

## 2019-11-14 DIAGNOSIS — I4819 Other persistent atrial fibrillation: Secondary | ICD-10-CM

## 2019-11-14 DIAGNOSIS — Z950 Presence of cardiac pacemaker: Secondary | ICD-10-CM | POA: Diagnosis not present

## 2019-11-14 DIAGNOSIS — R5383 Other fatigue: Secondary | ICD-10-CM | POA: Diagnosis not present

## 2019-11-14 DIAGNOSIS — I5023 Acute on chronic systolic (congestive) heart failure: Secondary | ICD-10-CM

## 2019-11-14 DIAGNOSIS — I350 Nonrheumatic aortic (valve) stenosis: Secondary | ICD-10-CM

## 2019-11-14 DIAGNOSIS — Z7901 Long term (current) use of anticoagulants: Secondary | ICD-10-CM | POA: Diagnosis not present

## 2019-11-14 LAB — ECHOCARDIOGRAM COMPLETE
AR max vel: 1.99 cm2
AV Area VTI: 2.37 cm2
AV Area mean vel: 2.14 cm2
AV Mean grad: 15 mmHg
AV Peak grad: 28.1 mmHg
Ao pk vel: 2.65 m/s
Area-P 1/2: 3.53 cm2
P 1/2 time: 396 msec
S' Lateral: 4.05 cm

## 2019-11-14 NOTE — Progress Notes (Signed)
  Echocardiogram 2D Echocardiogram has been performed.  Cody George 11/14/2019, 11:03 AM

## 2019-11-29 DIAGNOSIS — J441 Chronic obstructive pulmonary disease with (acute) exacerbation: Secondary | ICD-10-CM | POA: Diagnosis not present

## 2019-11-29 DIAGNOSIS — J449 Chronic obstructive pulmonary disease, unspecified: Secondary | ICD-10-CM | POA: Diagnosis not present

## 2019-11-29 DIAGNOSIS — D519 Vitamin B12 deficiency anemia, unspecified: Secondary | ICD-10-CM | POA: Diagnosis not present

## 2019-11-29 DIAGNOSIS — I509 Heart failure, unspecified: Secondary | ICD-10-CM | POA: Diagnosis not present

## 2019-11-29 DIAGNOSIS — I482 Chronic atrial fibrillation, unspecified: Secondary | ICD-10-CM | POA: Diagnosis not present

## 2019-12-10 ENCOUNTER — Ambulatory Visit (INDEPENDENT_AMBULATORY_CARE_PROVIDER_SITE_OTHER): Payer: Medicare Other

## 2019-12-10 DIAGNOSIS — I495 Sick sinus syndrome: Secondary | ICD-10-CM | POA: Diagnosis not present

## 2019-12-10 LAB — CUP PACEART REMOTE DEVICE CHECK
Battery Remaining Longevity: 42 mo
Battery Remaining Percentage: 40 %
Battery Voltage: 2.84 V
Brady Statistic RV Percent Paced: 75 %
Date Time Interrogation Session: 20211026034614
Implantable Lead Implant Date: 20130607
Implantable Lead Implant Date: 20130607
Implantable Lead Location: 753859
Implantable Lead Location: 753860
Implantable Pulse Generator Implant Date: 20130607
Lead Channel Impedance Value: 350 Ohm
Lead Channel Pacing Threshold Amplitude: 0.75 V
Lead Channel Pacing Threshold Pulse Width: 0.5 ms
Lead Channel Sensing Intrinsic Amplitude: 12 mV
Lead Channel Setting Pacing Amplitude: 2.5 V
Lead Channel Setting Pacing Pulse Width: 0.5 ms
Lead Channel Setting Sensing Sensitivity: 2 mV
Pulse Gen Model: 2210
Pulse Gen Serial Number: 7353349

## 2019-12-13 NOTE — Progress Notes (Signed)
Remote pacemaker transmission.   

## 2019-12-30 DIAGNOSIS — I499 Cardiac arrhythmia, unspecified: Secondary | ICD-10-CM | POA: Diagnosis not present

## 2019-12-30 DIAGNOSIS — I469 Cardiac arrest, cause unspecified: Secondary | ICD-10-CM | POA: Diagnosis not present

## 2019-12-30 DIAGNOSIS — R404 Transient alteration of awareness: Secondary | ICD-10-CM | POA: Diagnosis not present

## 2019-12-31 ENCOUNTER — Telehealth: Payer: Self-pay | Admitting: Internal Medicine

## 2019-12-31 NOTE — Telephone Encounter (Signed)
Patient portion of [employer] assistance application mailed to patient to be completed and returned to office to be submitted

## 2020-01-07 ENCOUNTER — Telehealth: Payer: Self-pay | Admitting: Internal Medicine

## 2020-01-07 NOTE — Telephone Encounter (Signed)
Spoke to  Daughter.  condolence given to [place]. She just wanted to know of results were there any  abnormalities with labs or echo.  RN did inform Cecille Rubin there was not  And that the patient was aware echo and lab result were mailed to patient.  daughter will contact patient assistance companies to inform them that patient is now decease. Daughter stated patient passed away in favorite recliner at home. "I think he just Mom"  Public affairs consultant for the call back

## 2020-01-07 NOTE — Telephone Encounter (Signed)
Called  Spoke to son -in - Sports coach. He states Cecille Rubin is out and he will give her the message to call back .

## 2020-01-07 NOTE — Telephone Encounter (Signed)
Thanks, Ivin Booty.

## 2020-01-07 NOTE — Telephone Encounter (Signed)
Follow up   Cody George is returning call, call transferred to Cody George

## 2020-01-07 NOTE — Telephone Encounter (Signed)
Cecille Rubin, the patient's daughter, is calling in regards to the last few office visit/tests this patient had with Dr. Sallyanne Kuster. She would like to speak with his nurse if possible. Cecille Rubin states that he had an echo and another test done about a month ago and never heard the results of it. She also wanted to know if there is anything she needs to do in regards to the financial assistance applications for prescription medications that had already been started. Please call/advice.   Thank you!

## 2020-01-13 ENCOUNTER — Encounter: Payer: Medicare Other | Admitting: Cardiovascular Disease

## 2020-01-15 DIAGNOSIS — 419620001 Death: Secondary | SNOMED CT | POA: Diagnosis not present

## 2020-01-15 DEATH — deceased

## 2020-01-20 ENCOUNTER — Ambulatory Visit: Payer: Medicare Other | Admitting: Internal Medicine
